# Patient Record
Sex: Female | Born: 1955 | Race: White | Hispanic: No | Smoking: Former smoker
Health system: Southern US, Community
[De-identification: ages and names within clinical notes are randomized; demographics above are authoritative.]

## PROBLEM LIST (undated history)

## (undated) DIAGNOSIS — J189 Pneumonia, unspecified organism: Secondary | ICD-10-CM

## (undated) DIAGNOSIS — K219 Gastro-esophageal reflux disease without esophagitis: Secondary | ICD-10-CM

## (undated) DIAGNOSIS — T7840XA Allergy, unspecified, initial encounter: Secondary | ICD-10-CM

## (undated) DIAGNOSIS — J439 Emphysema, unspecified: Secondary | ICD-10-CM

## (undated) DIAGNOSIS — G43909 Migraine, unspecified, not intractable, without status migrainosus: Secondary | ICD-10-CM

## (undated) DIAGNOSIS — I10 Essential (primary) hypertension: Secondary | ICD-10-CM

## (undated) DIAGNOSIS — J45909 Unspecified asthma, uncomplicated: Secondary | ICD-10-CM

## (undated) DIAGNOSIS — H269 Unspecified cataract: Secondary | ICD-10-CM

## (undated) DIAGNOSIS — F419 Anxiety disorder, unspecified: Secondary | ICD-10-CM

## (undated) DIAGNOSIS — M1990 Unspecified osteoarthritis, unspecified site: Secondary | ICD-10-CM

## (undated) DIAGNOSIS — K602 Anal fissure, unspecified: Secondary | ICD-10-CM

## (undated) DIAGNOSIS — J449 Chronic obstructive pulmonary disease, unspecified: Secondary | ICD-10-CM

## (undated) DIAGNOSIS — D126 Benign neoplasm of colon, unspecified: Secondary | ICD-10-CM

## (undated) DIAGNOSIS — K259 Gastric ulcer, unspecified as acute or chronic, without hemorrhage or perforation: Secondary | ICD-10-CM

## (undated) DIAGNOSIS — S92001A Unspecified fracture of right calcaneus, initial encounter for closed fracture: Secondary | ICD-10-CM

## (undated) HISTORY — DX: Unspecified cataract: H26.9

## (undated) HISTORY — DX: Essential (primary) hypertension: I10

## (undated) HISTORY — DX: Unspecified osteoarthritis, unspecified site: M19.90

## (undated) HISTORY — DX: Anal fissure, unspecified: K60.2

## (undated) HISTORY — PX: RIGHT OOPHORECTOMY: SHX2359

## (undated) HISTORY — DX: Gastric ulcer, unspecified as acute or chronic, without hemorrhage or perforation: K25.9

## (undated) HISTORY — DX: Chronic obstructive pulmonary disease, unspecified: J44.9

## (undated) HISTORY — DX: Anxiety disorder, unspecified: F41.9

## (undated) HISTORY — DX: Pneumonia, unspecified organism: J18.9

## (undated) HISTORY — DX: Gastro-esophageal reflux disease without esophagitis: K21.9

## (undated) HISTORY — DX: Emphysema, unspecified: J43.9

## (undated) HISTORY — DX: Benign neoplasm of colon, unspecified: D12.6

## (undated) HISTORY — DX: Unspecified asthma, uncomplicated: J45.909

## (undated) HISTORY — PX: TUBAL LIGATION: SHX77

## (undated) HISTORY — DX: Allergy, unspecified, initial encounter: T78.40XA

## (undated) HISTORY — PX: SHOULDER SURGERY: SHX246

## (undated) HISTORY — DX: Migraine, unspecified, not intractable, without status migrainosus: G43.909

## (undated) HISTORY — DX: Unspecified fracture of right calcaneus, initial encounter for closed fracture: S92.001A

## (undated) HISTORY — PX: OTHER SURGICAL HISTORY: SHX169

---

## 1999-03-06 ENCOUNTER — Encounter: Payer: Self-pay | Admitting: Emergency Medicine

## 1999-03-06 ENCOUNTER — Emergency Department (HOSPITAL_COMMUNITY): Admission: EM | Admit: 1999-03-06 | Discharge: 1999-03-06 | Payer: Self-pay | Admitting: Emergency Medicine

## 2000-02-07 ENCOUNTER — Emergency Department (HOSPITAL_COMMUNITY): Admission: EM | Admit: 2000-02-07 | Discharge: 2000-02-07 | Payer: Self-pay | Admitting: Emergency Medicine

## 2000-02-07 ENCOUNTER — Encounter: Payer: Self-pay | Admitting: Emergency Medicine

## 2000-02-27 ENCOUNTER — Encounter: Admission: RE | Admit: 2000-02-27 | Discharge: 2000-04-03 | Payer: Self-pay | Admitting: Orthopedic Surgery

## 2000-07-15 ENCOUNTER — Emergency Department (HOSPITAL_COMMUNITY): Admission: EM | Admit: 2000-07-15 | Discharge: 2000-07-15 | Payer: Self-pay

## 2004-05-18 ENCOUNTER — Ambulatory Visit (HOSPITAL_COMMUNITY): Admission: RE | Admit: 2004-05-18 | Discharge: 2004-05-18 | Payer: Self-pay | Admitting: Orthopedic Surgery

## 2006-11-09 ENCOUNTER — Ambulatory Visit: Payer: Self-pay | Admitting: Internal Medicine

## 2006-11-18 ENCOUNTER — Emergency Department (HOSPITAL_COMMUNITY): Admission: EM | Admit: 2006-11-18 | Discharge: 2006-11-18 | Payer: Self-pay | Admitting: Emergency Medicine

## 2006-11-19 ENCOUNTER — Emergency Department (HOSPITAL_COMMUNITY): Admission: EM | Admit: 2006-11-19 | Discharge: 2006-11-19 | Payer: Self-pay | Admitting: Emergency Medicine

## 2007-01-07 ENCOUNTER — Emergency Department (HOSPITAL_COMMUNITY): Admission: EM | Admit: 2007-01-07 | Discharge: 2007-01-07 | Payer: Self-pay | Admitting: Emergency Medicine

## 2010-02-09 ENCOUNTER — Encounter: Admission: RE | Admit: 2010-02-09 | Discharge: 2010-02-09 | Payer: Self-pay | Admitting: Family Medicine

## 2010-02-09 ENCOUNTER — Encounter: Payer: Self-pay | Admitting: Internal Medicine

## 2010-02-22 DIAGNOSIS — G5600 Carpal tunnel syndrome, unspecified upper limb: Secondary | ICD-10-CM | POA: Insufficient documentation

## 2010-02-22 DIAGNOSIS — J309 Allergic rhinitis, unspecified: Secondary | ICD-10-CM | POA: Insufficient documentation

## 2010-02-22 DIAGNOSIS — F419 Anxiety disorder, unspecified: Secondary | ICD-10-CM | POA: Insufficient documentation

## 2010-05-04 ENCOUNTER — Ambulatory Visit: Payer: Self-pay | Admitting: Internal Medicine

## 2010-05-04 DIAGNOSIS — J449 Chronic obstructive pulmonary disease, unspecified: Secondary | ICD-10-CM | POA: Insufficient documentation

## 2010-05-04 DIAGNOSIS — F17200 Nicotine dependence, unspecified, uncomplicated: Secondary | ICD-10-CM | POA: Insufficient documentation

## 2010-05-06 ENCOUNTER — Telehealth: Payer: Self-pay | Admitting: Internal Medicine

## 2010-12-20 NOTE — Progress Notes (Signed)
Summary: symbicort change to advair  Phone Note Outgoing Call   Call placed by: Vernie Murders,  May 06, 2010 2:32 PM Call placed to: Patient Summary of Call: Recieved a PA request for symbicort 160/4.5 from [facility].  Per MW- okay to switch pt to adviar 250/50 1 puff two times a day.  LMOM for pt TCB Initial call taken by: Vernie Murders,  May 06, 2010 2:34 PM  Follow-up for Phone Call        pt states she don't want anything if she can't have symbicort, she refuses the advair and will discuss with mw at her next visit--will forward to mw to make him aware Follow-up by: Philipp Deputy CMA,  May 09, 2010 4:10 PM     Appended Document: symbicort change to advair give her enough symbicort samples until her next ov and we'll address this then   Appended Document: symbicort change to advair [facility]  Appended Document: symbicort change to advair Samples were left up front for pt and she is aware to pick these up and be sure and keep followup appt for future rx.

## 2010-12-20 NOTE — Assessment & Plan Note (Signed)
Summary: Pulmonary/ new pt eval for copd   Visit Type:  Initial Consult Copy to:  Dr. Holley Bouche Primary Provider/Referring Provider:  Dr. Holley Bouche  CC:  Pulmonary consult. The patient c/o cough with yellow mucus that has recurred several times since March 2011. She says the cough is worse when lying down. SOB with exertion and at rest..  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  assoc with congested cough and nasal congestionsome better with mucinex.  Pt denies any significant sore throat, dysphagia, itching, sneezing, ,  fever, chills, sweats, unintended wt loss, pleuritic or exertional cp, hempoptysis, change in activity tolerance  orthopnea pnd or leg swelling.  Pt also denies any obvious fluctuation in symptoms with weather or environmental change or other alleviating or aggravating factors.       Preventive Screening-Counseling & Management  Alcohol-Tobacco     Alcohol drinks/day: <1     Smoking Status: current     Smoking Cessation Counseling: yes     Packs/Day: 0.5 x30 years  Current Medications (verified): 1)  Xanax 0.5 Mg Tabs (Alprazolam) .Marland Kitchen.. 1 By Mouth Four Times Daily As Needed  Allergies (verified): 1)  ! Biaxin 2)  ! Erythromycin 3)  ! Codeine 4)  ! Nsaids  Past History:  Past Medical History: COPD    - HFA  May 04, 2010 = 50% effective ALLERGIC RHINITIS (ICD-477.9) INSOMNIA (ICD-780.52) CARPAL TUNNEL SYNDROME, BILATERAL (ICD-354.0) ANXIETY (ICD-300.00)  Past Surgical History: Ganglion cyst removed left wrist in 1985 Left ovary removed in 1983  Family  History: Lymphoma---father Negative for respiratory diseases or atopy   Social History: Current smoker Disabled MarriedSmoking Status:  current Packs/Day:  0.5 x30 years Alcohol drinks/day:  <1  Review of Systems       The patient complains of shortness of breath with activity, shortness of breath at rest, productive cough, non-productive cough, tooth/dental problems, nasal congestion/difficulty breathing through nose, anxiety, and change in color of mucus.  The patient denies coughing up blood, chest pain, irregular heartbeats, acid heartburn, indigestion, loss of appetite, weight change, abdominal pain, difficulty swallowing, sore throat, headaches, sneezing, itching, ear ache, depression, hand/feet swelling, joint stiffness or pain, rash, and fever.    Vital Signs:  Patient profile:   55 year old female Height:      65 inches (165.10 cm) Weight:      124 pounds (56.36 kg) BMI:     20.71 O2 Sat:      91 % on Room air Temp:     99.3 degrees F (37.39 degrees C) oral Pulse rate:   95 / minute BP sitting:   128 / 80  (right arm) Cuff size:   regular  Vitals Entered By: Michel Bickers CMA (May 04, 2010 11:33 AM)  O2 Sat at Rest %:  91 O2 Flow:  Room air CC: Pulmonary consult. The patient c/o cough with yellow mucus that has recurred several times since March 2011. She says the cough is worse when lying down. SOB with exertion and at rest. Is Patient Diabetic? No Comments Medications reviewed. Daytime phone verified. Michel Bickers  CMA  May 04, 2010 11:33 AM   Physical Exam  Additional Exam:  wt 124 extremely anxious amb wf who  failed to answer a single question asked in a straightforward manner, tending to go off on tangents or answer questions with ambiguous medical terms or diagnoses and seemed upset when asked the same question more than once for clarification.  HEENT mild turbinate edema.  Oropharynx no thrush or excess pnd or cobblestoning.  No JVD or cervical adenopathy. Mild  accessory muscle hypertrophy. Trachea midline, nl thryroid. Chest was hyperinflated by percussion with diminished breath sounds and moderate increased exp time without wheeze. Hoover sign positive at mid inspiration. Regular rate and rhythm without murmur gallop or rub or increase P2 or edema.  Abd: no hsm, nl excursion. Ext warm without cyanosis or clubbing.     CXR  Procedure date:  05/04/2010  Findings:      c/w copd  Impression & Recommendations:  Problem # 1:  COPD UNSPECIFIED (ICD-496) DDX of  difficult airways managment all start with A and  include Adherence, Ace Inhibitors, Acid Reflux, Active Sinus Disease, Alpha 1 Antitripsin deficiency, Anxiety masquerading as Airways dz,  ABPA,  allergy(esp in young), Aspiration (esp in elderly), Adverse effects of DPI,  Active smokers, plus one B  = Beta blocker use..    In this case Adherence is the biggest issue and starts with  inability to use HFA effectively and also  understand that SABA treats the symptoms but doesn't get to the underlying problem (inflammation).  I used  the ananology of putting steroid cream on a rash to help explain the meaning of topical therapy and the need to get the drug to the target tissue.  rec trial of symbicort  I spent extra time with the patient today explaining optimal mdi  technique.  This improved from  25-50%  Active smoking :  see problem #2  Problem # 2:  SMOKER (ICD-305.1)  I took this opportunity to educate the patient regarding the consequences of smoking in airway disorders based on all the data we have from the multiple national lung health studies indicating that smoking cessation, not choice of inhalers or physicians, is the most important aspect of care.     I emphasized that although we never turn away smokers from the pulmonary clinic, we do ask that they understand that the recommendations that were made won't work nearly as well in the presence of continued cigarette exposure and we may  reach a point where we can't help the patient if he/she can't quit smoking.   Since already on psychotropics prefer she get all cns active meds from the same doctor and see Dr Tiburcio Pea 7 days before quit date for consideration of chantix.  Orders: New Patient Level V (95284)  Medications Added to Medication List This Visit: 1)  Xanax 0.5 Mg Tabs (Alprazolam) .Marland Kitchen.. 1 by mouth four times daily as needed 2)  Symbicort 160-4.5 Mcg/act Aero (Budesonide-formoterol fumarate) .... 2 puffs first thing  in am and 2 puffs again in pm about 12 hours later 3)  Ventolin Hfa 108 (90 Base) Mcg/act Aers (Albuterol sulfate) .Marland Kitchen.. 1-2 puffs every 4-6 hours as needed  Patient Instructions: 1)  committ to quit smoking 2)  Symbicort 160 2 puffs first thing  in am and 2 puffs again in pm about 12 hours later 3)  and use ventolin for breathing, mucinex for cough 4)  Please schedule a follow-up appointment in 6 weeks (or first available) ,  sooner if needed for PFT's  5)  Work on inhaler technique:  relax and blow all the way out then take a nice smooth deep breath back in, triggering the inhaler at same time you start breathing in and hold at least a few seconds Prescriptions: SYMBICORT 160-4.5 MCG/ACT  AERO (BUDESONIDE-FORMOTEROL FUMARATE) 2 puffs first thing  in am and 2 puffs again in pm about 12 hours later  #1 x 11   Entered and Authorized by:   Nyoka Cowden MD   Signed by:   Nyoka Cowden MD on 05/04/2010   Method used:   Electronically to        [facility] [phone]* (retail)       [address]       [place], [place]  [address]       Ph: [phone]       Fax: [phone]   RxID:   [id number]    CXR  Procedure date:  05/04/2010  Findings:      c/w copd

## 2011-04-04 NOTE — H&P (Signed)
Casey Schmidt, Casey Schmidt                ACCOUNT NO.:  [id number]   MEDICAL RECORD NO.:  [id number]          PATIENT TYPE:  AMB   LOCATION:                                FACILITY:  [facility]   PHYSICIAN:  Charles A. Delcambre, MD    DATE OF BIRTH:   DATE OF ADMISSION:  02/26/2007  DATE OF DISCHARGE:                              HISTORY & PHYSICAL   This is a 55 year old para 2-0-0-2, vaginal deliveries, tubal ligation  done for contraception.  Periods every 3-4 weeks but now bleeding almost  constantly despite medical treatment with Depo-Provera as well as  antibiotics, doxycycline.  She had endometrial biopsy which was benign  on December 27, 2006.  Ultrasound was done December 25, 2006, which showed  a normal-sized uterus 9.5 x 5.0 x 5.7 cm.  Right ovary surgically  absent, left ovary normal size.  She continues now to have bleeding  despite conservative efforts and has accompanying dysmenorrhea.  She has  been given some Vicodin to use sparingly.   PAST MEDICAL HISTORY:  1. Carpal tunnel syndrome.  2. Anxiety.   PAST SURGICAL HISTORY:  Cyst on the ovary 24 years ago, oophorectomy  done on the right with the tube also.   MEDICATIONS:  Xanax p.r.n. not taken regularly, Tylenol, and oxycodone  for shoulder and foot pain.   ALLERGIES:  ASPIRIN, CODEINE, DARVOCET, IBUPROFEN.  REACTIONS NOT  SPECIFIED.   SOCIAL HISTORY:  Married 25 years.  No condom use.  She smokes 1/4 of a  pack per day, and this has been for the last 30 years.  She drinks  socially per the patient history.  Denies current abuse.   FAMILY HISTORY:  No breast, colon, cervical, ovarian cancer.   She states last Pap smear was about 3 years ago, was negative.  Denies  any abnormal Pap smears by history.   PHYSICAL EXAMINATION:  VITAL SIGNS:  Alert and oriented x3, no distress.  Blood pressure 130/90, respirations 24, heart rate 88, afebrile.  HEENT:  Grossly within normal limits.  NECK:  Supple without thyromegaly,  adenopathy.  LUNGS:  Clear bilaterally.  BREAST EXAM:  Symmetrical, deferred.  ABDOMEN:  Soft, flat, nontender.  Some left lower quadrant mild  tenderness that she states is worse when she is bleeding.  I can detect  no masses, no hepatosplenomegaly noted as well.  PELVIC:  Normal external female genital, Bartholin, urethral, Skene's  within normal limits.  Vault without discharge.  A small amount of blood  is in the vault.  Multiparous cervix is noted.  Valsalva will push the  cervix down to within 2 cm of the edge of the introitus.  Tenaculum  could tug it to within 1 cm of the introitus.  BIMANUAL EXAMINATION:  No cervical motion tenderness is present.  Uterus  was nontender, anteverted.  Left adnexa, ovary palpable and normal size.  Minimally tender.  RECTAL EXAM:  Not done.  Anus, perineal body appear normal.   ASSESSMENT:  1. Menorrhagia 66.2.  2. Pelvic pain 625.9.  3. Dysmenorrhea 625.3.   PLAN:  Offered ablation, laparoscopy  for treatment of bleeding and  evaluation of pain.  She declines and wishes to proceed on with  definitive therapy with transvaginal hysterectomy and left salpingo-  oophorectomy.  She is counseled, gives informed consent and accepts the  risks of infection, bleeding, bowel and bladder damage, ureteral damage;  blood product risk including hepatitis and HIV exposure.  Remote risk of  vaginal vault prolapse after surgery.  All questions are answered, and  we will proceed as scheduled on February 26, 2007.   ADDENDUM:  We will have her undergo a Pap smear prior to surgery.      Charles A. Sydnee Cabal, MD  Electronically Signed     CAD/MEDQ  D:  02/05/2007  T:  02/05/2007  Job:  [id number]

## 2013-11-20 HISTORY — PX: WRIST SURGERY: SHX841

## 2014-02-25 ENCOUNTER — Emergency Department (HOSPITAL_COMMUNITY)
Admission: EM | Admit: 2014-02-25 | Discharge: 2014-02-25 | Disposition: A | Discharge status: Home / Self Care | Payer: Medicare Other | Attending: Emergency Medicine | Admitting: Emergency Medicine

## 2014-02-25 ENCOUNTER — Encounter (HOSPITAL_COMMUNITY): Payer: Self-pay | Admitting: Emergency Medicine

## 2014-02-25 ENCOUNTER — Emergency Department (HOSPITAL_COMMUNITY): Payer: Medicare Other

## 2014-02-25 DIAGNOSIS — Y939 Activity, unspecified: Secondary | ICD-10-CM | POA: Insufficient documentation

## 2014-02-25 DIAGNOSIS — Y929 Unspecified place or not applicable: Secondary | ICD-10-CM | POA: Insufficient documentation

## 2014-02-25 DIAGNOSIS — S52601A Unspecified fracture of lower end of right ulna, initial encounter for closed fracture: Secondary | ICD-10-CM

## 2014-02-25 DIAGNOSIS — S52509A Unspecified fracture of the lower end of unspecified radius, initial encounter for closed fracture: Secondary | ICD-10-CM | POA: Insufficient documentation

## 2014-02-25 DIAGNOSIS — F17200 Nicotine dependence, unspecified, uncomplicated: Secondary | ICD-10-CM | POA: Insufficient documentation

## 2014-02-25 DIAGNOSIS — W010XXA Fall on same level from slipping, tripping and stumbling without subsequent striking against object, initial encounter: Secondary | ICD-10-CM | POA: Insufficient documentation

## 2014-02-25 DIAGNOSIS — S52609A Unspecified fracture of lower end of unspecified ulna, initial encounter for closed fracture: Principal | ICD-10-CM

## 2014-02-25 DIAGNOSIS — S52501A Unspecified fracture of the lower end of right radius, initial encounter for closed fracture: Secondary | ICD-10-CM

## 2014-02-25 MED ORDER — ONDANSETRON 4 MG PO TBDP
4.0000 mg | ORAL_TABLET | Freq: Once | ORAL | Status: AC
  Administered 2014-02-25: 4 mg via ORAL
  Filled 2014-02-25: qty 1

## 2014-02-25 MED ORDER — ONDANSETRON HCL 4 MG PO TABS
4.0000 mg | ORAL_TABLET | Freq: Four times a day (QID) | ORAL | Status: DC
Start: 2014-02-25 — End: 2014-06-24

## 2014-02-25 MED ORDER — OXYCODONE-ACETAMINOPHEN 5-325 MG PO TABS
1.0000 | ORAL_TABLET | Freq: Once | ORAL | Status: AC
  Administered 2014-02-25: 1 via ORAL
  Filled 2014-02-25: qty 1

## 2014-02-25 MED ORDER — OXYCODONE-ACETAMINOPHEN 5-325 MG PO TABS: 2.0000 | ORAL_TABLET | ORAL | Status: DC | PRN

## 2014-02-25 NOTE — ED Notes (Signed)
Pt states tripped last night and hurt right wrist; presents with bruising and swelling to right wrist/forearm

## 2014-02-25 NOTE — Discharge Instructions (Signed)
Please follow up with Dr. Burney Gauze tomorrow at 8:10am in his office for further care.  Keep your wrist elevated and take pain medications as needed.    Wrist Fracture A wrist fracture is a break in one of the bones of the wrist. Your wrist is made up of several small bones at the palm of your hand (carpal bones) and the two bones that make up your forearm (radius and ulna). The bones come together to form multiple large and small joints. The shape and design of these joints allow your wrist to bend and straighten, move side-to-side, and rotate, as in twisting your palm up or down. CAUSES  A fracture may occur in any of the bones in your wrist when enough force is applied to the wrist, such as when falling down onto an outstretched hand. Severe injuries may occur from a more forceful injury. SYMPTOMS Symptoms of wrist fractures include tenderness, bruising, and swelling. Additionally, the wrist may hang in an odd position or may be misshaped. DIAGNOSIS To diagnose a wrist fracture, your caregiver will physically examine your wrist. Your caregiver may also request an X-ray exam of your wrist. TREATMENT Treatment depends on many factors, including the nature and location of the fracture, your age, and your activity level. Treatment for wrist fracture can be nonsurgical or surgical. For nonsurgical treatment, a plaster cast or splint may be applied to your wrist if the bone is in a good position (aligned). The cast will stay on for about 6 weeks. If the alignment of your bone is not good, it may be necessary to realign (reduce) it. After the bone is reduced, a splint usually is placed on your wrist to allow for a small amount of normal swelling. After about 1 week, the splint is removed and a cast is added. The cast is removed 2 or 3 weeks later, after the swelling goes down, causing the cast to loosen. Another cast is applied. This cast is removed after about another 2 or 3 weeks, for a total of 4 to 6  weeks of immobilization. Sometimes the position of the bone is so far out of place that surgery is required to apply a device to hold it together as it heals. If the bone cannot be reduced without cutting the skin around the bone (closed reduction), a cut (incision) is made to allow direct access to the bone to reduce it (open reduction). Depending on the fracture, there are a number of options for holding the bone in place while it heals, including a cast, metal pins, a plate and screws, and a device called an external fixator. With an external fixator, most of the hardware remains outside of the body. HOME CARE INSTRUCTIONS  To lessen swelling, keep your injured wrist elevated and move your fingers as much as possible.  Apply ice to your wrist for the first 1 to 2 days after you have been treated or as directed by your caregiver. Applying ice helps to reduce inflammation and pain.  Put ice in a plastic bag.  Place a towel between your skin and the bag.  Leave the ice on for 15 to 20 minutes at a time, every 2 hours while you are awake.  Do not put pressure on any part of your cast or splint. It may break.  Use a plastic bag to protect your cast or splint from water while bathing or showering. Do not lower your cast or splint into water.  Only take over-the-counter or prescription  medicines for pain as directed by your caregiver. SEEK IMMEDIATE MEDICAL CARE IF:   Your cast or splint gets damaged or breaks.  You have continued severe pain or more swelling than you did before the cast was put on.  Your skin or fingernails below the injury turn blue or gray or feel cold or numb.  You develop decreased feeling in your fingers. MAKE SURE YOU:  Understand these instructions.  Will watch your condition.  Will get help right away if you are not doing well or get worse. Document Released: 08/16/2005 Document Revised: 01/29/2012 Document Reviewed: 11/24/2011 [facility] Patient Information  2014 [place], [place].

## 2014-02-25 NOTE — ED Provider Notes (Signed)
CSN: [id number]     Arrival date & time 02/25/14  1131 History  This chart was scribed for non-physician practitioner Domenic Moras, PA-C working with Ephraim Hamburger, MD by Rolanda Lundborg, ED Scribe. This patient was seen in room WTR8/WTR8 and the patient's care was started at 1:00 PM.     Chief Complaint  Patient presents with  . Wrist Injury   The history is provided by the patient. No language interpreter was used.   HPI Comments: Casey Schmidt is a 58 y.o. female who presents to the Emergency Department complaining of sudden-onset, constant right wrist pain with associated bruising and swelling since tripping over a laundry bag last night. She states she fell forward and put her hand out to break the fall. She denies hitting her head or LOC. She denies pain in the elbow or shoulder.    History reviewed. No pertinent past medical history. History reviewed. No pertinent past surgical history. No family history on file. History  Substance Use Topics  . Smoking status: Current Every Day Smoker    Types: Cigarettes  . Smokeless tobacco: Not on file  . Alcohol Use: Not on file   OB History   Grav Para Term Preterm Abortions TAB SAB Ect Mult Living                 Review of Systems  Musculoskeletal: Positive for arthralgias (right wrist) and joint swelling (right wrist).      Allergies  Aspirin; Clarithromycin; Codeine; Erythromycin; and Nsaids  Home Medications  No current outpatient prescriptions on file. BP 144/86  Pulse 103  Temp(Src) 97.6 F (36.4 C) (Oral)  Resp 14  SpO2 100% Physical Exam  Nursing note and vitals reviewed. Constitutional: She is oriented to person, place, and time. She appears well-developed and well-nourished. No distress.  HENT:  Head: Normocephalic and atraumatic.  Eyes: EOM are normal.  Neck: Neck supple. No tracheal deviation present.  Cardiovascular: Normal rate.   Pulmonary/Chest: Effort normal. No respiratory distress.   Musculoskeletal:  Right arm - right wrist with moderate swelling and echhymosis and tenderness throughout wrist but most significant to the ulnar aspect with crepitus . Radial pulse is 2+. Decreased grip strength secondary to pain but normal senstation to all fingers. Right elbow and right shoulder normal.   Neurological: She is alert and oriented to person, place, and time.  Skin: Skin is warm and dry.  Psychiatric: She has a normal mood and affect. Her behavior is normal.    ED Course  Procedures (including critical care time) Medications  oxyCODONE-acetaminophen (PERCOCET/ROXICET) 5-325 MG per tablet 1 tablet (1 tablet Oral Given 02/25/14 1350)  ondansetron (ZOFRAN-ODT) disintegrating tablet 4 mg (4 mg Oral Given 02/25/14 1350)    DIAGNOSTIC STUDIES: Oxygen Saturation is 100% on RA, normal by my interpretation.    COORDINATION OF CARE: 1:26 PM- patient with a right wrist fracture, closed, involving both the distal ulna and radius. She is neurovascularly intact. I have consult with hand specialist who recommended splinting patient and have close follow up tomorrow. Discussed treatment plan with pt which includes splint application, pain medication, and referral to a hand specialist. Pt agrees to plan. Care discussed with Dr. Regenia Skeeter  2:44 PM - Will have an appointment with Dr Burney Gauze tomorrow at 8:10am.    Labs Review Labs Reviewed - No data to display Imaging Review Dg Wrist Complete Right  02/25/2014   CLINICAL DATA:  Pain status post fall  EXAM: RIGHT WRIST - COMPLETE  3+ VIEW  COMPARISON:  None.  FINDINGS: The bones are osteopenic. An impacted transverse fracture with dorsal angulation and displacement is appreciated along the distal radius. A nondisplaced fracture is appreciated along the base of the ulnar styloid with a component of impaction.  IMPRESSION: Distal ulnar and radius fractures.   Electronically Signed   By: Margaree Mackintosh M.D.   On: 02/25/2014 12:38     EKG  Interpretation None      MDM   Final diagnoses:  Fracture of radius, distal, with ulna, right, closed    BP 144/86  Pulse 103  Temp(Src) 97.6 F (36.4 C) (Oral)  Resp 14  SpO2 100%  I have reviewed nursing notes and vital signs. I personally reviewed the imaging tests through PACS system  I reviewed available ER/hospitalization records thought the EMR   I personally performed the services described in this documentation, which was scribed in my presence. The recorded information has been reviewed and is accurate.    Domenic Moras, PA-C 02/25/14 1526

## 2014-02-26 NOTE — ED Provider Notes (Signed)
Medical screening examination/treatment/procedure(s) were performed by non-physician practitioner and as supervising physician I was immediately available for consultation/collaboration.   EKG Interpretation None        Ephraim Hamburger, MD 02/26/14 [phone]

## 2014-04-30 ENCOUNTER — Telehealth: Payer: Self-pay | Admitting: *Deleted

## 2014-04-30 NOTE — Telephone Encounter (Addendum)
Attempted to call pt, unable to leave a message. Voice mail not set up.    Medication List and Allergies:  90 Day supply/Mail order: Local pharmacy: Immunizations Due:  A/P FH/PSH or Personal History: Flu Vaccine: Tdap: PNA: Shingles: CCS: Pap: MMG:  BD:  To discuss with provider:

## 2014-05-01 ENCOUNTER — Ambulatory Visit: Payer: Medicare Other | Admitting: Internal Medicine

## 2014-05-01 DIAGNOSIS — Z0289 Encounter for other administrative examinations: Secondary | ICD-10-CM

## 2014-06-24 ENCOUNTER — Encounter: Payer: Self-pay | Admitting: Internal Medicine

## 2014-06-24 ENCOUNTER — Ambulatory Visit (INDEPENDENT_AMBULATORY_CARE_PROVIDER_SITE_OTHER): Payer: Medicare Other | Admitting: Internal Medicine

## 2014-06-24 VITALS — BP 120/74 | HR 109 | Temp 98.2°F | Ht 64.0 in | Wt 103.0 lb

## 2014-06-24 DIAGNOSIS — J4489 Other specified chronic obstructive pulmonary disease: Secondary | ICD-10-CM

## 2014-06-24 DIAGNOSIS — M25531 Pain in right wrist: Secondary | ICD-10-CM

## 2014-06-24 DIAGNOSIS — M1990 Unspecified osteoarthritis, unspecified site: Secondary | ICD-10-CM | POA: Insufficient documentation

## 2014-06-24 DIAGNOSIS — F411 Generalized anxiety disorder: Secondary | ICD-10-CM

## 2014-06-24 DIAGNOSIS — M25539 Pain in unspecified wrist: Secondary | ICD-10-CM

## 2014-06-24 DIAGNOSIS — J449 Chronic obstructive pulmonary disease, unspecified: Secondary | ICD-10-CM

## 2014-06-24 MED ORDER — HYDROCODONE-ACETAMINOPHEN 5-325 MG PO TABS: 1.0000 | ORAL_TABLET | Freq: Three times a day (TID) | ORAL | Status: DC | PRN

## 2014-06-24 NOTE — Progress Notes (Signed)
Subjective:    Patient ID: Casey Schmidt, female    DOB: [birth year], 58 y.o.   MRN: [id number]  DOS:  06/24/2014 Type of visit - description: New patient, to get established History:  Previous PCP Dr. Kenton Kingfisher at [facility] In general doing well, taking Xanax for anxiety, lost her husband. The last time she took Xanax was today. Also had a wrist fracture in April, was prescribed Percocet by the ER doctor and by the orthopedic surgery. She was released from orthopedic surgery care recently (per pt). Would like to have a prescription for pain medication. Last dose of Percocet about a week ago.    ROS Denies nausea, vomiting, diarrhea. No blood in the stool or stomach pain. No depression at this time, again mood is well controlled with as needed Xanax, she takes it most days.   Past Medical History  Diagnosis Date  . Arthritis   . COPD (chronic obstructive pulmonary disease)   . GERD (gastroesophageal reflux disease)   . Multiple gastric ulcers     d/t BC powders  . Hypertension   . Migraines     h/o  . Anxiety     Past Surgical History  Procedure Laterality Date  . Right oophorectomy    . Wrist surgery Right 2015    fx  . Tubal ligation    . Ganglion cyst removal Left remotely     from wrist    History   Social History  . Marital Status: Widowed    Spouse Name: N/A    Number of Children: 2  . Years of Education: N/A   Occupational History  . retired-disability d/t anxiety-djd    Social History Main Topics  . Smoking status: Former Smoker    Types: Cigarettes    Quit date: 05/24/2014  . Smokeless tobacco: Never Used  . Alcohol Use: Yes     Comment: rare  . Drug Use: No  . Sexual Activity: Not on file   Other Topics Concern  . Not on file   Social History Narrative   Lost husband 2014   Family History  Problem Relation Age of Onset  . Hypertension Mother   . Hypertension Father   . Cancer Father     type?         Medication List       This list is accurate as of: 06/24/14 11:59 PM.  Always use your most recent med list.               ALPRAZolam 0.5 MG tablet  Commonly known as:  XANAX  1 tablet as needed. Up to 4x daily     HYDROcodone-acetaminophen 5-325 MG per tablet  Commonly known as:  NORCO/VICODIN  Take 1 tablet by mouth every 8 (eight) hours as needed.     pantoprazole 40 MG tablet  Commonly known as:  PROTONIX  1 tablet daily.           Objective:   Physical Exam BP 120/74  Pulse 109  Temp(Src) 98.2 F (36.8 C) (Oral)  Ht 5\' 4"  (1.626 m)  Wt 103 lb (46.72 kg)  BMI 17.67 kg/m2  SpO2 99% General -- alert, well-developed, NAD.  Neck --no thyromegaly  HEENT-- Not pale.  Lungs -- normal respiratory effort, no intercostal retractions, no accessory muscle use, and decreased  breath sounds.  Heart-- normal rate, regular rhythm, no murmur.  Extremities-- no pretibial edema bilaterally  Neurologic--  alert & oriented X3. Speech normal, gait  appropriate for age, strength symmetric and appropriate for age.  Psych-- Cognition and judgment appear intact. Cooperative with normal attention span and concentration. No anxious or depressed appearing.      Assessment & Plan:   Get records from previous PCP

## 2014-06-24 NOTE — Patient Instructions (Signed)
Please stop by the lab and give [place] a urine sample for a UDS  For pain: Tylenol  500 mg OTC 1 tabs a day every 8 hours as needed for pain If the pain is severe, take hydrocodone instead  Next visit in 3 months, please make an appointment  Please sign a release of information to get the records from your previous PCP: Notes , XRs and labs for the last 2 years   EKG, echocardiogram, colonoscopy and  procedure notes

## 2014-06-24 NOTE — Assessment & Plan Note (Signed)
Status post surgery for a right wrist fracture in April 2015, Was taking Percocet, pain is okay but would like to have some pain medication in case she needs it. She is allergic to codeine but she reports tolerates Vicodin well. Plan: Tylenol as needed, low dose --- reports a higher dose cause nausea. Vicodin if pain  is a little more severe.

## 2014-06-24 NOTE — Assessment & Plan Note (Signed)
Uses a inhaler rarely, name?

## 2014-06-24 NOTE — Assessment & Plan Note (Signed)
Lost her husband last year, at some point in the past was on Wellbutrin, currently on Xanax only. Plan: Refill Xanax as needed UDS

## 2014-07-10 ENCOUNTER — Telehealth: Payer: Self-pay

## 2014-07-10 NOTE — Telephone Encounter (Signed)
UDS 06/24/2014  Positive for Xanax. Negative for hydromorphone and hydrocodone.  Low risk per Dr. Larose Kells (07/09/2014).

## 2014-07-22 ENCOUNTER — Ambulatory Visit (INDEPENDENT_AMBULATORY_CARE_PROVIDER_SITE_OTHER): Payer: Medicare Other | Admitting: Internal Medicine

## 2014-07-22 ENCOUNTER — Encounter: Payer: Self-pay | Admitting: Internal Medicine

## 2014-07-22 VITALS — BP 122/74 | HR 105 | Temp 98.3°F | Wt 104.1 lb

## 2014-07-22 DIAGNOSIS — F411 Generalized anxiety disorder: Secondary | ICD-10-CM

## 2014-07-22 DIAGNOSIS — R0609 Other forms of dyspnea: Secondary | ICD-10-CM

## 2014-07-22 DIAGNOSIS — R0989 Other specified symptoms and signs involving the circulatory and respiratory systems: Secondary | ICD-10-CM

## 2014-07-22 DIAGNOSIS — R0683 Snoring: Secondary | ICD-10-CM | POA: Insufficient documentation

## 2014-07-22 NOTE — Progress Notes (Signed)
   Subjective:    Patient ID: Casey Schmidt, female    DOB: September 22, 1956, 58 y.o.   MRN: [id number]  DOS:  07/22/2014 Type of visit - description : acute Interval history: Patient is concerned about his snoring, she is taking a trip and will be sharing a room with somebody else and is embarrassed. Reports that she knows she snores for at least 3 years, her family have not noted any apnea episodes. She already tried breathing strips but that didn't work.    ROS Denies depression, continue with anxiety. Denies feeling sleepy but she does feel tired  Past Medical History  Diagnosis Date  . Arthritis   . COPD (chronic obstructive pulmonary disease)   . GERD (gastroesophageal reflux disease)   . Multiple gastric ulcers     d/t BC powders  . Hypertension   . Migraines     h/o  . Anxiety     Past Surgical History  Procedure Laterality Date  . Right oophorectomy    . Wrist surgery Right 2015    fx  . Tubal ligation    . Ganglion cyst removal Left remotely     from wrist    History   Social History  . Marital Status: Widowed    Spouse Name: N/A    Number of Children: 2  . Years of Education: N/A   Occupational History  . retired-disability d/t anxiety-djd    Social History Main Topics  . Smoking status: Former Smoker    Types: Cigarettes    Quit date: 05/24/2014  . Smokeless tobacco: Never Used  . Alcohol Use: Yes     Comment: rare  . Drug Use: No  . Sexual Activity: Not on file   Other Topics Concern  . Not on file   Social History Narrative   Lost husband 2014        Medication List       This list is accurate as of: 07/22/14 11:59 PM.  Always use your most recent med list.               ALPRAZolam 0.5 MG tablet  Commonly known as:  XANAX  1 tablet as needed. Up to 4x daily     HYDROcodone-acetaminophen 5-325 MG per tablet  Commonly known as:  NORCO/VICODIN  Take 1 tablet by mouth every 8 (eight) hours as needed.     pantoprazole 40 MG  tablet  Commonly known as:  PROTONIX  1 tablet daily.           Objective:   Physical Exam BP 122/74  Pulse 105  Temp(Src) 98.3 F (36.8 C) (Oral)  Wt 104 lb 0.9 oz (47.2 kg)  SpO2 98% General -- alert, well-developed, NAD.  HEENT-- Not pale.  Face symmetric, sinuses not tender to palpation. Nose not congested. throat w/o redness, symmetric  Lung-- decreased BS B CV--  slt tachycardic   Extremities-- no pretibial edema bilaterally  Neurologic--  alert & oriented X3. Speech normal, gait appropriate for age, strength symmetric and appropriate for age.  Psych-- Cognition and judgment appear intact. Cooperative with normal attention span and concentration. slt  anxious but no  depressed appearing.        Assessment & Plan:   Slightly tachycardic, states he is anxious because she couldn't find our new office

## 2014-07-22 NOTE — Progress Notes (Signed)
Pre visit review using our clinic review tool, if applicable. No additional management support is needed unless otherwise documented below in the visit note. 

## 2014-07-22 NOTE — Assessment & Plan Note (Signed)
UDS low risk 06-2014

## 2014-07-22 NOTE — Assessment & Plan Note (Addendum)
3 years history of snoring, she feels tired but not sleepy however she filled a Epworth Sleepiness Scale and she scored 12 which is high. I suspect sleep apnea. For short-term treatment of snoring, rec a dental appliance , may like to start with a OTC. Will refer to pulmonary for further evaluation.  Cardiovascular consequences of untreated  sleep apnea ( if confirmed)  discussed with the patient

## 2014-07-22 NOTE — Patient Instructions (Addendum)
Tried to use any over-the-counter dental appliance for snoring. We are referring you to one of our sleep specialists See you in  November

## 2014-07-23 ENCOUNTER — Encounter: Payer: Self-pay | Admitting: Internal Medicine

## 2014-07-28 ENCOUNTER — Telehealth: Payer: Self-pay | Admitting: Internal Medicine

## 2014-07-28 MED ORDER — HYDROCODONE-ACETAMINOPHEN 5-325 MG PO TABS: 1.0000 | ORAL_TABLET | Freq: Three times a day (TID) | ORAL | Status: DC | PRN

## 2014-07-28 NOTE — Telephone Encounter (Signed)
Pt is requesting refill for Hydrocodone.   Last OV: 07/22/2014 Last Fill: 06/24/2014 # 30 with 0 RF Last UDS: 06/24/2014  Please advise.

## 2014-07-28 NOTE — Telephone Encounter (Signed)
Caller name: Kennyth Lose  Call back number:[phone]   Reason for call:  Pt wants a  Refill on Rx HYDROcodone-acetaminophen (NORCO/VICODIN) 5-325 MG per   Fyi: apt with pulmonology is 10/14

## 2014-07-28 NOTE — Telephone Encounter (Signed)
Spoke with Pt, informed her that medication is ready at front desk for pick up.

## 2014-07-28 NOTE — Telephone Encounter (Signed)
done

## 2014-07-30 ENCOUNTER — Telehealth: Payer: Self-pay | Admitting: Internal Medicine

## 2014-07-30 NOTE — Telephone Encounter (Signed)
Pt is requesting Alprazolam refill.  Last OV:07/22/2014 Last Fill: 03/17/2014 Last UDS: 06/24/2014  Please advise.

## 2014-07-30 NOTE — Telephone Encounter (Signed)
Request refill on xanax 0.5mg 

## 2014-07-31 MED ORDER — ALPRAZOLAM 0.5 MG PO TABS: 0.5000 mg | ORAL_TABLET | Freq: Four times a day (QID) | ORAL | Status: DC | PRN

## 2014-07-31 NOTE — Telephone Encounter (Signed)
done

## 2014-07-31 NOTE — Telephone Encounter (Signed)
Medication faxed to Pharmacy.  

## 2014-08-24 ENCOUNTER — Telehealth: Payer: Self-pay | Admitting: Internal Medicine

## 2014-08-24 MED ORDER — HYDROCODONE-ACETAMINOPHEN 5-325 MG PO TABS: 1.0000 | ORAL_TABLET | Freq: Three times a day (TID) | ORAL | Status: DC | PRN

## 2014-08-24 NOTE — Telephone Encounter (Signed)
Caller name: Casey Schmidt  Call back [phone]   Reason for call:  Pt would like a refill on Rx HYDROcodone-acetaminophen (NORCO/VICODIN) 5-325 MG.  Call when ready.

## 2014-08-24 NOTE — Telephone Encounter (Signed)
Ok  #30 

## 2014-08-24 NOTE — Telephone Encounter (Signed)
Pt is requesting refill for Hydrocodone.  Last OV: 07/22/2014  Last Fill: 07/28/2014 # 30 0RF UDS: 06/24/2014 Low risk  Please advise.

## 2014-08-25 NOTE — Telephone Encounter (Signed)
LMOM for Pt informing her rx is ready for pick up at front desk.

## 2014-09-02 ENCOUNTER — Encounter (INDEPENDENT_AMBULATORY_CARE_PROVIDER_SITE_OTHER): Payer: Self-pay

## 2014-09-02 ENCOUNTER — Ambulatory Visit (INDEPENDENT_AMBULATORY_CARE_PROVIDER_SITE_OTHER): Payer: Medicare Other | Admitting: Pulmonary Disease

## 2014-09-02 ENCOUNTER — Encounter: Payer: Self-pay | Admitting: Pulmonary Disease

## 2014-09-02 VITALS — BP 124/80 | HR 108 | Temp 97.8°F | Ht 64.0 in | Wt 103.0 lb

## 2014-09-02 DIAGNOSIS — R0683 Snoring: Secondary | ICD-10-CM

## 2014-09-02 DIAGNOSIS — Z23 Encounter for immunization: Secondary | ICD-10-CM

## 2014-09-02 NOTE — Progress Notes (Signed)
Subjective:    Patient ID: Casey Schmidt, female    DOB: 1956-02-15, 58 y.o.   MRN: [id number]  HPI The patient is a 58 year old female who I've been asked to see for possible obstructive sleep apnea. The patient has been noted to have loud snoring, as well as an abnormal breathing pattern during sleep. She has frequent awakenings at night, and is not rested in the mornings upon arising. She notes significant sleep pressure during the day, and can fall asleep easily with inactivity. She can also doze in the evenings watching TV if the program is not interesting, but denies any sleepiness with driving. It should be noted the patient only weighs 103 pounds, but has been snoring for quite some time. She has gained 15 pounds over the last 2 years on purpose, and her Epworth score is very abnormal at 16. It should be noted that she does kick her legs at times, but does not have symptoms of RLS. She has known chronic musculoskeletal pain.   Sleep Questionnaire What time do you typically go to bed?( Between what hours) 11 PM 11 PM at 1618 on 09/02/14 by Inge Rise, CMA How long does it take you to fall asleep? 1 hr 1 hr at 1618 on 09/02/14 by Inge Rise, CMA How many times during the night do you wake up? 4 4 at 1618 on 09/02/14 by Inge Rise, [place] What time do you get out of bed to start your day? 0800 0800 at 1618 on 09/02/14 by Inge Rise, CMA Do you drive or operate heavy machinery in your occupation? No No at 1618 on 09/02/14 by Inge Rise, CMA How much has your weight changed (up or down) over the past two years? (In pounds) 15 lb (6.804 kg) 15 lb (6.804 kg) at 1618 on 09/02/14 by Inge Rise, CMA Have you ever had a sleep study before? No No at 1618 on 09/02/14 by Inge Rise, CMA Do you currently use CPAP? No No at 1618 on 09/02/14 by Inge Rise, CMA Do you wear oxygen at any time? No No at 1618 on 09/02/14 by Inge Rise, CMA   Review of Systems    Constitutional: Negative for fever and unexpected weight change.  HENT: Positive for sneezing. Negative for congestion, dental problem, ear pain, nosebleeds, postnasal drip, rhinorrhea, sinus pressure, sore throat and trouble swallowing.   Eyes: Negative for redness and itching.  Respiratory: Positive for cough and shortness of breath. Negative for chest tightness and wheezing.   Cardiovascular: Negative for palpitations and leg swelling.  Gastrointestinal: Negative for nausea and vomiting.  Genitourinary: Negative for dysuria.  Musculoskeletal: Negative for joint swelling.  Skin: Negative for rash.  Neurological: Negative for headaches.  Hematological: Does not bruise/bleed easily.  Psychiatric/Behavioral: Negative for dysphoric mood. The patient is nervous/anxious.        Objective:   Physical Exam Constitutional:  Thin female, no acute distress  HENT:  Nares patent without discharge  Oropharynx without exudate, palate and uvula are significantly elongated.   Eyes:  Perrla, eomi, no scleral icterus  Neck:  No JVD, no TMG  Cardiovascular:  Normal rate, regular rhythm, no rubs or gallops.  2/6 sem        Intact distal pulses  Pulmonary :  Mildly decreased breath sounds, no stridor or respiratory distress   No rales, rhonchi, or wheezing  Abdominal:  Soft, nondistended, bowel sounds present.  No tenderness noted.  Musculoskeletal:  No lower extremity edema noted.  Lymph Nodes:  No cervical lymphadenopathy noted  Skin:  No cyanosis noted  Neurologic:  Alert, appropriate, moves all 4 extremities without obvious deficit.         Assessment & Plan:

## 2014-09-02 NOTE — Assessment & Plan Note (Signed)
The patient has loud snoring as well as an abnormal breathing pattern during sleep noted by her family. She has frequent awakenings, and nonrestorative sleep. She also describes significant sleep pressure during the day. However, she does not have a body habitus for someone with clinically significant sleep apnea, although she does have a significantly elongated uvula and palate. It is unclear whether she may have another type of sleep disorder that is causing her nonrestorative sleep and excessive daytime sleepiness. At this point, I think she needs to have a formal sleep study for evaluation. The patient is agreeable to this, and will arrange for followup once the results are available.

## 2014-09-02 NOTE — Patient Instructions (Signed)
Will schedule for a sleep study, and arrange followup once the results are available.  

## 2014-09-09 ENCOUNTER — Telehealth: Payer: Self-pay | Admitting: Internal Medicine

## 2014-09-09 MED ORDER — ALPRAZOLAM 0.5 MG PO TABS: 0.5000 mg | ORAL_TABLET | Freq: Four times a day (QID) | ORAL | Status: DC | PRN

## 2014-09-09 NOTE — Telephone Encounter (Signed)
done

## 2014-09-09 NOTE — Telephone Encounter (Signed)
As of 09/09/2014 have not received RF request from [facility] as stated.   Pt requesting refill on alprazolam  Last OV: 07/22/2014 Last Fill: 07/31/2014 #60 0RF UDS: 06/24/2014 Low risk  Please advise.

## 2014-09-09 NOTE — Telephone Encounter (Signed)
Faxed to [facility].

## 2014-09-09 NOTE — Telephone Encounter (Signed)
Caller name: Helyn Schwan Relation to pt: Call back number: [phone] Pharmacy: [id number] - [place], [place]   Reason for call: Pt requesting rx for ALPRAZolam (XANAX) 0.5 MG tablet. Pt states pharmacy sent fax yesterday and they have not receive anything. Please advise.

## 2014-09-24 ENCOUNTER — Ambulatory Visit (INDEPENDENT_AMBULATORY_CARE_PROVIDER_SITE_OTHER): Payer: Medicare Other | Admitting: Internal Medicine

## 2014-09-24 ENCOUNTER — Encounter: Payer: Self-pay | Admitting: Internal Medicine

## 2014-09-24 VITALS — BP 142/80 | HR 67 | Temp 98.0°F | Wt 102.5 lb

## 2014-09-24 DIAGNOSIS — F419 Anxiety disorder, unspecified: Secondary | ICD-10-CM

## 2014-09-24 DIAGNOSIS — Z23 Encounter for immunization: Secondary | ICD-10-CM

## 2014-09-24 DIAGNOSIS — M159 Polyosteoarthritis, unspecified: Secondary | ICD-10-CM

## 2014-09-24 DIAGNOSIS — J410 Simple chronic bronchitis: Secondary | ICD-10-CM

## 2014-09-24 DIAGNOSIS — M150 Primary generalized (osteo)arthritis: Secondary | ICD-10-CM

## 2014-09-24 MED ORDER — ESCITALOPRAM OXALATE 10 MG PO TABS: 10.0000 mg | ORAL_TABLET | Freq: Every day | ORAL | Status: DC

## 2014-09-24 MED ORDER — ALBUTEROL SULFATE HFA 108 (90 BASE) MCG/ACT IN AERS: 2.0000 | INHALATION_SPRAY | Freq: Four times a day (QID) | RESPIRATORY_TRACT | Status: DC

## 2014-09-24 MED ORDER — TIOTROPIUM BROMIDE MONOHYDRATE 18 MCG IN CAPS: 18.0000 ug | ORAL_CAPSULE | Freq: Every day | RESPIRATORY_TRACT | Status: DC

## 2014-09-24 NOTE — Assessment & Plan Note (Addendum)
Complain of generalized pain: Back, knees, finger. Symptoms not well-controlled, states  I don't give her enough Vicodin but at the same time states cannot take Tylenol because it causes nausea(which is a contradiction). I asked her about tramadol and states she can't take it because it "burns the stomach ". History of PUD, I don't think she needs to be taking NSAIDs. 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 Plan:  for now I don't have much else to offer except Vicodin as needed. No more than 30 a month. Interestingly, the only option the patient the patient gave me is to increase narcotics which is not appropriate for dates DJD treatment. Requested x-ray of the back which we will get offered ortho referral

## 2014-09-24 NOTE — Patient Instructions (Signed)
Get your blood work before you leave   Stop by the first floor and get the XR   COPD: Spiriva daily Albuterol if cough wheezing  Start Lexapro for anxiety, 1 tablet daily  Next visit in 2 months

## 2014-09-24 NOTE — Assessment & Plan Note (Addendum)
Reports anxiety is not well-controlled because "I don't give her enough Xanax". I recommend to try SSRI, in the past she took them for depression and did not  help but  this time her main concern is anxiety Plan:  continue low-dose of Xanax, no more than 60 a month Start Lexapro Labs  Interestingly the patient likes to simply get more Xanax which is not appropriately unless she fails to a number of SSRIs and other medications.

## 2014-09-24 NOTE — Progress Notes (Signed)
Subjective:    Patient ID: Casey Schmidt, female    DOB: 1956/03/25, 58 y.o.   MRN: [id number]  DOS:  09/24/2014 Type of visit - description : f/u Interval history: DJD, continue with generalized pain, currently symptoms not well-controlled. See assessment and plan Sleep apnea, studies pending. Anxiety, currently symptoms not well-controlled, see assessment and plan Cough, ongoing issue, more frequent and intense?.   ROS Denies fever chills Mild difficulty breathing sometimes. No sputum production, hemoptysis. Occasional wheezing.   Past Medical History  Diagnosis Date  . Arthritis   . COPD (chronic obstructive pulmonary disease)   . GERD (gastroesophageal reflux disease)   . Multiple gastric ulcers     d/t BC powders  . Hypertension   . Migraines     h/o  . Anxiety     Past Surgical History  Procedure Laterality Date  . Right oophorectomy    . Wrist surgery Right 2015    fx  . Tubal ligation    . Ganglion cyst removal Left remotely     from wrist    History   Social History  . Marital Status: Widowed    Spouse Name: N/A    Number of Children: 2  . Years of Education: N/A   Occupational History  . retired-disability d/t anxiety-djd    Social History Main Topics  . Smoking status: Current Every Day Smoker -- 0.25 packs/day for 32 years    Types: Cigarettes  . Smokeless tobacco: Never Used     Comment: smoking again as of 09-2014   . Alcohol Use: 0.0 oz/week    0 Not specified per week     Comment: rare  . Drug Use: No  . Sexual Activity: Not on file   Other Topics Concern  . Not on file   Social History Narrative   Lost husband 2014        Medication List       This list is accurate as of: 09/24/14 11:59 PM.  Always use your most recent med list.               albuterol 108 (90 BASE) MCG/ACT inhaler  Commonly known as:  VENTOLIN HFA  Inhale 2 puffs into the lungs 4 (four) times daily.     ALPRAZolam 0.5 MG tablet  Commonly  known as:  XANAX  Take 1 tablet (0.5 mg total) by mouth 4 (four) times daily as needed.     escitalopram 10 MG tablet  Commonly known as:  LEXAPRO  Take 1 tablet (10 mg total) by mouth daily.     famotidine 20 MG tablet  Commonly known as:  PEPCID  Take 20 mg by mouth at bedtime.     HYDROcodone-acetaminophen 5-325 MG per tablet  Commonly known as:  NORCO/VICODIN  Take 1 tablet by mouth every 8 (eight) hours as needed.     pantoprazole 40 MG tablet  Commonly known as:  PROTONIX  1 tablet daily.     tiotropium 18 MCG inhalation capsule  Commonly known as:  SPIRIVA HANDIHALER  Place 1 capsule (18 mcg total) into inhaler and inhale daily.           Objective:   Physical Exam BP 142/80 mmHg  Pulse 67  Temp(Src) 98 F (36.7 C) (Oral)  Wt 102 lb 8 oz (46.494 kg)  SpO2 95%  General -- alert, well-developed, NAD.   Lungs -- normal respiratory effort, no intercostal retractions, no accessory muscle use, anddecreased breath  sounds.  Heart-- normal rate, regular rhythm, no murmur.    Extremities-- no pretibial edema bilaterally  Neurologic--  alert & oriented X3. Speech normal, gait appropriate for age, strength symmetric and appropriate for age.    Psych-- Cognition and judgment appear intact. Cooperative with normal attention span and concentration. No anxious or depressed appearing.       Assessment & Plan:   [id number] >> 25 min

## 2014-09-24 NOTE — Progress Notes (Signed)
Pre visit review using our clinic review tool, if applicable. No additional management support is needed unless otherwise documented below in the visit note. 

## 2014-09-24 NOTE — Assessment & Plan Note (Addendum)
Heavy smoker, having cough and difficulty breathing. Plan: PFTs Spiriva daily, albuterol as needed labs

## 2014-09-25 ENCOUNTER — Telehealth: Payer: Self-pay | Admitting: Internal Medicine

## 2014-09-25 NOTE — Telephone Encounter (Signed)
emmi mailed  °

## 2014-09-29 ENCOUNTER — Encounter: Payer: Self-pay | Admitting: Internal Medicine

## 2014-10-01 ENCOUNTER — Ambulatory Visit (HOSPITAL_BASED_OUTPATIENT_CLINIC_OR_DEPARTMENT_OTHER): Payer: Medicare Other | Attending: Pulmonary Disease

## 2014-10-01 VITALS — Ht 64.0 in | Wt 103.0 lb

## 2014-10-01 DIAGNOSIS — R0683 Snoring: Secondary | ICD-10-CM

## 2014-10-01 DIAGNOSIS — G4730 Sleep apnea, unspecified: Secondary | ICD-10-CM | POA: Diagnosis present

## 2014-10-01 DIAGNOSIS — G4710 Hypersomnia, unspecified: Secondary | ICD-10-CM | POA: Insufficient documentation

## 2014-10-01 DIAGNOSIS — G4733 Obstructive sleep apnea (adult) (pediatric): Secondary | ICD-10-CM

## 2014-10-07 DIAGNOSIS — G4733 Obstructive sleep apnea (adult) (pediatric): Secondary | ICD-10-CM

## 2014-10-07 NOTE — Progress Notes (Signed)
Mindy, let pt know that her sleep study does not show sleep apnea or any other nighttime sleep disorder.  I suspect part of her issue is related to chronic pain or some degree of insomnia.  Let her know that I will send a copy of this note to Dr. Larose Kells.

## 2014-10-07 NOTE — Sleep Study (Signed)
   NAME: Casey Schmidt DATE OF BIRTH:  1956-05-12 MEDICAL RECORD NUMBER [id number]  LOCATION: [facility]  PHYSICIAN: [place] OF STUDY: 10/01/2014  SLEEP STUDY TYPE: Nocturnal Polysomnogram               REFERRING PHYSICIAN: Clance, Armando Reichert, MD  INDICATION FOR STUDY: Hypersomnia with sleep apnea  EPWORTH SLEEPINESS SCORE:  13 HEIGHT: 5\' 4"  (162.6 cm)  WEIGHT: 103 lb (46.72 kg)    Body mass index is 17.67 kg/(m^2).  NECK SIZE: 12.5 in.  MEDICATIONS: reviewed in the sleep record  SLEEP ARCHITECTURE: the patient had a total sleep time of 247 minutes, with little slow-wave sleep and no REM. Sleep onset latency was mildly prolonged at 37 minutes, and sleep efficiency was poor at 68%.  RESPIRATORY DATA: the patient was found to have 3 apneas and only one obstructive hypoxemia, giving her an AHI of only one event per hour. The events occurred all in the supine position, and there was moderate snoring noted throughout. The patient did not achieve REM during the night, but had adequate total sleep time for evaluation  OXYGEN DATA: there was transient oxygen desaturation as low as 89%  CARDIAC DATA: no clinically significant arrhythmias were seen  MOVEMENT/PARASOMNIA: no periodic limb movements or abnormal behaviors were noted.  IMPRESSION/ RECOMMENDATION:    1) small numbers of obstructive events which do not meet the AHI criteria for the obstructive sleep apnea syndrome. The patient did not achieve REM, but had adequate total sleep time for evaluation. She did have moderate snoring noted throughout.  Clinical correlation is recommended regarding the cause of the patient's sleepiness complaints.      [place], American Board of Sleep Medicine  ELECTRONICALLY SIGNED ON:  10/07/2014, 5:48 PM [place] PH: [phone]   FX: [phone] [place]

## 2014-10-08 ENCOUNTER — Telehealth: Payer: Self-pay | Admitting: Internal Medicine

## 2014-10-08 NOTE — Telephone Encounter (Signed)
Pt is requesting refill on Hydrocodone.  Last OV: 09/24/2014 Last Fill: 08/24/2014 # 30 0RF UDS: 06/24/2014 Low risk   Please advise.

## 2014-10-08 NOTE — Telephone Encounter (Signed)
Caller name: Casey Schmidt Relation to pt: self Call back number: [phone] Pharmacy:  Reason for call:   Patient requesting a new hyrdrocodone rx

## 2014-10-08 NOTE — Progress Notes (Signed)
[id number] x1 

## 2014-10-09 MED ORDER — HYDROCODONE-ACETAMINOPHEN 5-325 MG PO TABS: 1.0000 | ORAL_TABLET | Freq: Three times a day (TID) | ORAL | Status: DC | PRN

## 2014-10-09 NOTE — Telephone Encounter (Signed)
done

## 2014-10-09 NOTE — Progress Notes (Signed)
I spoke with patient about results and she verbalized understanding and had no questions 

## 2014-10-09 NOTE — Telephone Encounter (Signed)
Spoke with Pt, informed her medication is ready for pick up. Placed at front desk.

## 2014-10-11 ENCOUNTER — Telehealth: Payer: Self-pay | Admitting: Internal Medicine

## 2014-10-11 NOTE — Telephone Encounter (Signed)
A number of labs were requested during the last visit 09/24/2014, the patient did not get them done. Please arrange labs visit only

## 2014-10-12 NOTE — Telephone Encounter (Signed)
LMOM for Pt informing her that she still needs to return to office and have NON FASTING labs drawn from [facility] 09/24/2014. Lab orders already placed.

## 2014-10-14 NOTE — Telephone Encounter (Signed)
Letter printed and mailed to Pt informing her she did not have her labs drawn on 11/19, she still needs to return to the office to have labs drawn non fasting.

## 2014-10-28 ENCOUNTER — Ambulatory Visit: Payer: Medicare Other | Admitting: Internal Medicine

## 2014-10-29 ENCOUNTER — Ambulatory Visit: Payer: Medicare Other | Admitting: Internal Medicine

## 2014-10-30 ENCOUNTER — Encounter: Payer: Self-pay | Admitting: Internal Medicine

## 2014-10-30 ENCOUNTER — Ambulatory Visit (HOSPITAL_BASED_OUTPATIENT_CLINIC_OR_DEPARTMENT_OTHER)
Admission: RE | Admit: 2014-10-30 | Discharge: 2014-10-30 | Disposition: A | Discharge status: Home / Self Care | Payer: Medicare Other | Source: Ambulatory Visit | Attending: Internal Medicine | Admitting: Internal Medicine

## 2014-10-30 ENCOUNTER — Ambulatory Visit (INDEPENDENT_AMBULATORY_CARE_PROVIDER_SITE_OTHER): Payer: Medicare Other | Admitting: Internal Medicine

## 2014-10-30 VITALS — BP 139/85 | HR 92 | Temp 97.9°F | Wt 102.0 lb

## 2014-10-30 DIAGNOSIS — J449 Chronic obstructive pulmonary disease, unspecified: Secondary | ICD-10-CM

## 2014-10-30 DIAGNOSIS — M19039 Primary osteoarthritis, unspecified wrist: Secondary | ICD-10-CM

## 2014-10-30 DIAGNOSIS — R636 Underweight: Secondary | ICD-10-CM | POA: Insufficient documentation

## 2014-10-30 DIAGNOSIS — M150 Primary generalized (osteo)arthritis: Secondary | ICD-10-CM

## 2014-10-30 DIAGNOSIS — M159 Polyosteoarthritis, unspecified: Secondary | ICD-10-CM

## 2014-10-30 DIAGNOSIS — R0683 Snoring: Secondary | ICD-10-CM

## 2014-10-30 DIAGNOSIS — F419 Anxiety disorder, unspecified: Secondary | ICD-10-CM

## 2014-10-30 DIAGNOSIS — M8580 Other specified disorders of bone density and structure, unspecified site: Secondary | ICD-10-CM

## 2014-10-30 DIAGNOSIS — J410 Simple chronic bronchitis: Secondary | ICD-10-CM

## 2014-10-30 LAB — CBC WITH DIFFERENTIAL/PLATELET
BASOS PCT: 0.7 % (ref 0.0–3.0)
Basophils Absolute: 0.1 10*3/uL (ref 0.0–0.1)
EOS PCT: 1.4 % (ref 0.0–5.0)
Eosinophils Absolute: 0.1 10*3/uL (ref 0.0–0.7)
HCT: 39.9 % (ref 36.0–46.0)
HEMOGLOBIN: 13.2 g/dL (ref 12.0–15.0)
Lymphocytes Relative: 19 % (ref 12.0–46.0)
Lymphs Abs: 2 10*3/uL (ref 0.7–4.0)
MCHC: 33.1 g/dL (ref 30.0–36.0)
MCV: 92.2 fl (ref 78.0–100.0)
Monocytes Absolute: 1.1 10*3/uL — ABNORMAL HIGH (ref 0.1–1.0)
Monocytes Relative: 10.7 % (ref 3.0–12.0)
NEUTROS ABS: 7.2 10*3/uL (ref 1.4–7.7)
NEUTROS PCT: 68.2 % (ref 43.0–77.0)
Platelets: 223 10*3/uL (ref 150.0–400.0)
RBC: 4.32 Mil/uL (ref 3.87–5.11)
RDW: 14.1 % (ref 11.5–15.5)
WBC: 10.5 10*3/uL (ref 4.0–10.5)

## 2014-10-30 LAB — COMPREHENSIVE METABOLIC PANEL
ALBUMIN: 4.4 g/dL (ref 3.5–5.2)
ALT: 14 U/L (ref 0–35)
AST: 23 U/L (ref 0–37)
Alkaline Phosphatase: 96 U/L (ref 39–117)
BUN: 10 mg/dL (ref 6–23)
CO2: 24 mEq/L (ref 19–32)
Calcium: 9.1 mg/dL (ref 8.4–10.5)
Chloride: 100 mEq/L (ref 96–112)
Creatinine, Ser: 1 mg/dL (ref 0.4–1.2)
GFR: 64.45 mL/min (ref >60.00)
GLUCOSE: 82 mg/dL (ref 70–99)
POTASSIUM: 3.8 meq/L (ref 3.5–5.1)
Sodium: 133 mEq/L — ABNORMAL LOW (ref 135–145)
Total Bilirubin: 0.4 mg/dL (ref 0.2–1.2)
Total Protein: 7.2 g/dL (ref 6.0–8.3)

## 2014-10-30 LAB — SEDIMENTATION RATE: Sed Rate: 10 mm/hr (ref 0–22)

## 2014-10-30 LAB — C-REACTIVE PROTEIN

## 2014-10-30 LAB — RHEUMATOID FACTOR

## 2014-10-30 LAB — TSH: TSH: 3 u[IU]/mL (ref 0.35–4.50)

## 2014-10-30 MED ORDER — SERTRALINE HCL 100 MG PO TABS: 100.0000 mg | ORAL_TABLET | Freq: Every day | ORAL | Status: DC

## 2014-10-30 NOTE — Progress Notes (Signed)
Subjective:    Patient ID: Casey Schmidt, female    DOB: 07-Dec-1955, 58 y.o.   MRN: [id number]  DOS:  10/30/2014 Type of visit - description : Acute, not feeling better Interval history: Generalized aches and pains, unchanged from previous. Had the sleep study, likes to discuss results Anxiety, took Lexapro for a month, not better.  ROS Occasional headaches. No fever or chills No joint swelling or redness. No rash  Past Medical History  Diagnosis Date  . Arthritis   . COPD (chronic obstructive pulmonary disease)   . GERD (gastroesophageal reflux disease)   . Multiple gastric ulcers     d/t BC powders  . Hypertension   . Migraines     h/o  . Anxiety     Past Surgical History  Procedure Laterality Date  . Right oophorectomy    . Wrist surgery Right 2015    fx  . Tubal ligation    . Ganglion cyst removal Left remotely     from wrist    History   Social History  . Marital Status: Widowed    Spouse Name: N/A    Number of Children: 2  . Years of Education: N/A   Occupational History  . retired-disability d/t anxiety-djd    Social History Main Topics  . Smoking status: Current Every Day Smoker -- 0.25 packs/day for 32 years    Types: Cigarettes  . Smokeless tobacco: Never Used     Comment: smoking again as of 09-2014   . Alcohol Use: 0.0 oz/week    0 Not specified per week     Comment: rare  . Drug Use: No  . Sexual Activity: Not on file   Other Topics Concern  . Not on file   Social History Narrative   Lost husband 2014        Medication List       This list is accurate as of: 10/30/14 11:51 AM.  Always use your most recent med list.               albuterol 108 (90 BASE) MCG/ACT inhaler  Commonly known as:  VENTOLIN HFA  Inhale 2 puffs into the lungs 4 (four) times daily.     ALPRAZolam 0.5 MG tablet  Commonly known as:  XANAX  Take 1 tablet (0.5 mg total) by mouth 4 (four) times daily as needed.     escitalopram 10 MG tablet    Commonly known as:  LEXAPRO  Take 1 tablet (10 mg total) by mouth daily.     famotidine 20 MG tablet  Commonly known as:  PEPCID  Take 20 mg by mouth at bedtime.     HYDROcodone-acetaminophen 5-325 MG per tablet  Commonly known as:  NORCO/VICODIN  Take 1 tablet by mouth every 8 (eight) hours as needed.     pantoprazole 40 MG tablet  Commonly known as:  PROTONIX  1 tablet daily.     tiotropium 18 MCG inhalation capsule  Commonly known as:  SPIRIVA HANDIHALER  Place 1 capsule (18 mcg total) into inhaler and inhale daily.           Objective:   Physical Exam BP 139/85 mmHg  Pulse 92  Temp(Src) 97.9 F (36.6 C) (Oral)  Wt 102 lb (46.267 kg)  SpO2 96% General -- alert, well-developed, NAD.  Neck --no   LADs Lungs -- normal respiratory effort, no intercostal retractions, no accessory muscle use, and decreased breath sounds.  Heart-- normal rate, regular  rhythm, no murmur.   Extremities-- no pretibial edema bilaterally ;wrists w/ TTP but no swelling, redness or effusion. Neurologic--  alert & oriented X3. Speech normal, gait appropriate for age, strength symmetric and appropriate for age.  Psych-- Cognition and judgment appear intact. Cooperative with normal attention span and concentration. Not depressed but seemed frustrated by her symptoms     Assessment & Plan:  Today , I spent more than  25  min with the patient: >50% of the time counseling regards anxiety, treatment options, need to see psychiaty (pt declined), discussing results w/ other MDs

## 2014-10-30 NOTE — Progress Notes (Signed)
Pre visit review using our clinic review tool, if applicable. No additional management support is needed unless otherwise documented below in the visit note. 

## 2014-10-30 NOTE — Patient Instructions (Signed)
Get your blood work before you leave   Stop by the first floor and get the XR     Stop Lexapro, start sertraline 100 mg: The first 10 days take half tablet daily, then take 1 tablet a day  Next visit in one month  We need to get the records from your previous doctor at [facility], sign a release of information before you leave ( at the front desk)

## 2014-10-31 NOTE — Assessment & Plan Note (Signed)
Generalized aches and pains. Likely DJD. Will check a sedimentation rate, CRP, ANA, rheumatoid factor. Refer to rheumatology. See previous entry regards pain management.

## 2014-10-31 NOTE — Assessment & Plan Note (Signed)
PFTs pending, CXR

## 2014-10-31 NOTE — Assessment & Plan Note (Addendum)
States she is not better, took Lexapro 10 mg for one month without apparent benefit. Plan: Switch to Zoloft, psychiatry referral (declined referral for now)

## 2014-10-31 NOTE — Assessment & Plan Note (Signed)
Had a sleep study, neg for OSA, results  discussed with pulmonology:  sleep complaints were probably due to chronic pain and insomnia.  if the snoring itself bothers her rx options include a dental appliance or ENT to look at her upper airway (may help snoring but not sleeping)

## 2014-10-31 NOTE — Assessment & Plan Note (Signed)
Patient is underweight but no weight loss over  the last 4 months. Labs

## 2014-11-02 LAB — ANA: Anti Nuclear Antibody(ANA): NEGATIVE

## 2014-11-03 ENCOUNTER — Telehealth: Payer: Self-pay | Admitting: Internal Medicine

## 2014-11-03 NOTE — Telephone Encounter (Signed)
Spoke with Pt, informed her of lab results and x-ray results. Pt verbalized understanding. Pt informed me she believes she has bronchitis. Pt has appt scheduled for 11/04/2014 at 1600, but informed me that she "didn't know if she can make it until then", instructed her if she felt that bad she needs to go on to ED right now. Pt verbalized understanding and stated she would.

## 2014-11-03 NOTE — Telephone Encounter (Signed)
Caller name:Pacey Geni Bers Relation to OX:BDZH Call back [phone] Pharmacy:  Reason for call: pt is returning your call, please call back

## 2014-11-03 NOTE — Addendum Note (Signed)
Addended by: Wilfrid Lund on: 11/03/2014 04:25 PM   Modules accepted: Orders, Medications

## 2014-11-03 NOTE — Telephone Encounter (Signed)
Please check on her in the morning

## 2014-11-04 ENCOUNTER — Encounter: Payer: Self-pay | Admitting: Internal Medicine

## 2014-11-04 ENCOUNTER — Ambulatory Visit (INDEPENDENT_AMBULATORY_CARE_PROVIDER_SITE_OTHER): Payer: Medicare Other | Admitting: Internal Medicine

## 2014-11-04 VITALS — BP 112/68 | HR 111 | Temp 98.0°F | Wt 96.2 lb

## 2014-11-04 DIAGNOSIS — J40 Bronchitis, not specified as acute or chronic: Secondary | ICD-10-CM

## 2014-11-04 MED ORDER — PREDNISONE 10 MG PO TABS: ORAL_TABLET | ORAL | Status: DC

## 2014-11-04 MED ORDER — AMOXICILLIN 500 MG PO CAPS: 1000.0000 mg | ORAL_CAPSULE | Freq: Two times a day (BID) | ORAL | Status: DC

## 2014-11-04 MED ORDER — BENZONATATE 200 MG PO CAPS: 200.0000 mg | ORAL_CAPSULE | Freq: Three times a day (TID) | ORAL | Status: DC | PRN

## 2014-11-04 MED ORDER — HYDROCODONE-ACETAMINOPHEN 5-325 MG PO TABS: 1.0000 | ORAL_TABLET | Freq: Three times a day (TID) | ORAL | Status: DC | PRN

## 2014-11-04 MED ORDER — DOXYCYCLINE HYCLATE 100 MG PO TABS: 100.0000 mg | ORAL_TABLET | Freq: Two times a day (BID) | ORAL | Status: DC

## 2014-11-04 NOTE — Telephone Encounter (Signed)
Pt has acute appt today (11/04/2014) at 4.

## 2014-11-04 NOTE — Progress Notes (Signed)
Pre visit review using our clinic review tool, if applicable. No additional management support is needed unless otherwise documented below in the visit note. 

## 2014-11-04 NOTE — Progress Notes (Signed)
Subjective:    Patient ID: Casey Schmidt, female    DOB: 06/12/1956, 58 y.o.   MRN: [id number]  DOS:  11/04/2014 Type of visit - description : acute Interval history: Symptoms started 2 days ago with cough and having some trouble breathing. Cough is dry, good compliance with Spiriva and taking albuterol as needed. Denies sick contacts   ROS Denies fever or chills but sometimes she feels hot/cold. Mild sore throat, denies runny nose, sinus pain or congestion. Anterior chest pain only with cough. No lower extremity edema + whhezing since the symptoms started  Past Medical History  Diagnosis Date  . Arthritis   . COPD (chronic obstructive pulmonary disease)   . GERD (gastroesophageal reflux disease)   . Multiple gastric ulcers     d/t BC powders  . Hypertension   . Migraines     h/o  . Anxiety     Past Surgical History  Procedure Laterality Date  . Right oophorectomy    . Wrist surgery Right 2015    fx  . Tubal ligation    . Ganglion cyst removal Left remotely     from wrist    History   Social History  . Marital Status: Widowed    Spouse Name: N/A    Number of Children: 2  . Years of Education: N/A   Occupational History  . retired-disability d/t anxiety-djd    Social History Main Topics  . Smoking status: Current Every Day Smoker -- 0.25 packs/day for 32 years    Types: Cigarettes  . Smokeless tobacco: Never Used     Comment: smoking again as of 09-2014   . Alcohol Use: 0.0 oz/week    0 Not specified per week     Comment: rare  . Drug Use: No  . Sexual Activity: Not on file   Other Topics Concern  . Not on file   Social History Narrative   Lost husband 2014        Medication List       This list is accurate as of: 11/04/14 11:59 PM.  Always use your most recent med list.               albuterol 108 (90 BASE) MCG/ACT inhaler  Commonly known as:  VENTOLIN HFA  Inhale 2 puffs into the lungs 4 (four) times daily.     ALPRAZolam  0.5 MG tablet  Commonly known as:  XANAX  Take 1 tablet (0.5 mg total) by mouth 4 (four) times daily as needed.     benzonatate 200 MG capsule  Commonly known as:  TESSALON  Take 1 capsule (200 mg total) by mouth 3 (three) times daily as needed for cough.     doxycycline 100 MG tablet  Commonly known as:  VIBRA-TABS  Take 1 tablet (100 mg total) by mouth 2 (two) times daily.     famotidine 20 MG tablet  Commonly known as:  PEPCID  Take 20 mg by mouth at bedtime.     HYDROcodone-acetaminophen 5-325 MG per tablet  Commonly known as:  NORCO/VICODIN  Take 1 tablet by mouth every 8 (eight) hours as needed.     pantoprazole 40 MG tablet  Commonly known as:  PROTONIX  1 tablet daily.     predniSONE 10 MG tablet  Commonly known as:  DELTASONE  4 tablets x 2 days, 3 tabs x 2 days, 2 tabs x 2 days, 1 tab x 2 days     sertraline  100 MG tablet  Commonly known as:  ZOLOFT  Take 1 tablet (100 mg total) by mouth daily.     tiotropium 18 MCG inhalation capsule  Commonly known as:  SPIRIVA HANDIHALER  Place 1 capsule (18 mcg total) into inhaler and inhale daily.           Objective:   Physical Exam BP 112/68 mmHg  Pulse 111  Temp(Src) 98 F (36.7 C) (Oral)  Wt 96 lb 4 oz (43.659 kg)  SpO2 94%  General -- alert, well-developed, slightly tachycardic but not toxic appearing  HEENT-- Not pale.  R Ear-- normal L ear-- normal Throat symmetric, no redness or discharge. Face symmetric, sinuses not tender to palpation. Nose slt congested.  Lungs -- normal respiratory effort, no intercostal retractions, no accessory muscle use,  few rhonchi with cough, slightly decreased breath sounds. Heart-- slightly tachycardic, no murmur   Extremities-- no pretibial edema bilaterally  Neurologic--  alert & oriented X3. Speech normal, gait appropriate for age, strength symmetric and appropriate for age.  Psych-- Cognition and judgment appear intact. Cooperative with normal attention span and  concentration. No anxious or depressed appearing.        Assessment & Plan:   COPD, Likely COPD exacerbated by bronchitis. subjectively short of breath and heart rate is slightly elevated but not toxic appearing. We talk about possibly doing a chest x-ray--- patient declined. Plan: Doxycycline, prednisone, Tessalon Perles. See instructions. Continue Spiriva and Ventolin

## 2014-11-04 NOTE — Patient Instructions (Addendum)
Rest, fluids , tylenol If  cough, take Mucinex DM twice a day as needed  If the cough continue, try Tessalon Perles Prednisone as prescribed  If nasal  congestion use OTC Nasocort or Flonase : 2 nasal sprays on each side of the nose daily until you feel better Take the antibiotic as prescribed  (doxy) Call if not gradually better over the next  10 days

## 2014-11-05 ENCOUNTER — Telehealth: Payer: Self-pay | Admitting: Internal Medicine

## 2014-11-05 NOTE — Telephone Encounter (Signed)
emmi mailed  °

## 2014-11-08 ENCOUNTER — Telehealth: Payer: Self-pay | Admitting: Internal Medicine

## 2014-11-08 NOTE — Telephone Encounter (Signed)
Call [facility], abnormal MMG 10-15, was recommended to have a f/u MMG, please get f/u mmg report, if not done, please schedule

## 2014-11-10 ENCOUNTER — Telehealth: Payer: Self-pay | Admitting: Internal Medicine

## 2014-11-10 DIAGNOSIS — J441 Chronic obstructive pulmonary disease with (acute) exacerbation: Secondary | ICD-10-CM

## 2014-11-10 MED ORDER — DOXYCYCLINE HYCLATE 100 MG PO TABS: 100.0000 mg | ORAL_TABLET | Freq: Two times a day (BID) | ORAL | Status: DC

## 2014-11-10 NOTE — Telephone Encounter (Signed)
Please advise 

## 2014-11-10 NOTE — Telephone Encounter (Signed)
Caller name:Schlachter, Muna Relation to GF:REVQ Call back number:[phone] Pharmacy:[email or web address]  Reason for call: pt was seen 12/16 pt states her last antibiotic is today, pt still does not feel well at all, states she is very fatigue and sore in back and chest would like to know if she need another antibiotic called in.

## 2014-11-10 NOTE — Telephone Encounter (Signed)
Please call 6 additional doxycycline tablets to complete 10 days of Rx Schedule a chest x-ray----- DX COPD Office visit next week if not much better

## 2014-11-10 NOTE — Telephone Encounter (Signed)
Noted  

## 2014-11-10 NOTE — Telephone Encounter (Signed)
Contacted the pt, pt states she will get the xray done, and if not better next week she will make an appt to be seen

## 2014-11-10 NOTE — Telephone Encounter (Signed)
Please inform Pt, 6 additional tablets of [facility]cycline [facility]. Dr. Larose Kells would like her to have another chest x-ray done (order placed), she can come to first floor of [facility] any time to have that done. And if she is not feeling better next week she will need to be seen again.

## 2014-11-11 MED ORDER — PREDNISONE 10 MG PO TABS: ORAL_TABLET | ORAL | Status: DC

## 2014-11-11 NOTE — Addendum Note (Signed)
Addended by: Wilfrid Lund on: 11/11/2014 01:08 PM   Modules accepted: Orders

## 2014-11-11 NOTE — Telephone Encounter (Signed)
Send a new prescription: Prednisone 10 mg 2 tablets daily for 3 days, then 1 tablet daily for 3 days, #9, no refills

## 2014-11-11 NOTE — Telephone Encounter (Signed)
Please advise 

## 2014-11-11 NOTE — Telephone Encounter (Signed)
Pt is now requesting more predniSONE (DELTASONE) 10 MG,

## 2014-11-11 NOTE — Telephone Encounter (Signed)
Prednisone refilled with new instructions, # 9 and 0 refills, sent to [facility].

## 2014-11-12 NOTE — Telephone Encounter (Signed)
Tried [employer], on hold for 5 minutes, will retry Monday.

## 2014-11-16 NOTE — Telephone Encounter (Signed)
thx

## 2014-11-16 NOTE — Telephone Encounter (Signed)
Spoke with [facility], Pt was seen again for diagnostic mammogram on 09/21/2014, requested they fax results to [place]. Informed they would fax them to [place].

## 2014-11-18 ENCOUNTER — Telehealth: Payer: Self-pay | Admitting: Pulmonary Disease

## 2014-11-18 NOTE — Telephone Encounter (Signed)
Called and spoke to pt. Pt requested sleep study results. Address confirmed with pt. Sleep study placed in outgoing mail. Pt aware. Nothing further needed.

## 2014-11-23 ENCOUNTER — Telehealth: Payer: Self-pay | Admitting: Internal Medicine

## 2014-11-23 NOTE — Telephone Encounter (Signed)
done

## 2014-11-23 NOTE — Telephone Encounter (Signed)
Pt is requesting refill on Alprazolam.  Last OV: 11/04/2014 Last Fill: 09/09/2014 # 60 1 RF UDS: 06/24/2014 Low risk  Please advise.

## 2014-11-24 NOTE — Telephone Encounter (Signed)
[facility]ed to [facility]

## 2014-11-30 ENCOUNTER — Ambulatory Visit: Payer: Medicare Other | Admitting: Internal Medicine

## 2014-12-07 ENCOUNTER — Telehealth: Payer: Self-pay | Admitting: Internal Medicine

## 2014-12-07 MED ORDER — HYDROCODONE-ACETAMINOPHEN 5-325 MG PO TABS: 1.0000 | ORAL_TABLET | Freq: Three times a day (TID) | ORAL | Status: DC | PRN

## 2014-12-07 NOTE — Telephone Encounter (Signed)
Pt is requesting refill on Hydrocodone.  Last OV: 11/04/2014 Last Fill: 11/04/2014 # 30 0RF UDS: 06/24/2014 Low risk  Please advise.

## 2014-12-07 NOTE — Telephone Encounter (Signed)
done

## 2014-12-07 NOTE — Telephone Encounter (Signed)
Spoke with Pt, informed Pt that Rx is ready for pick up at front desk.

## 2014-12-07 NOTE — Telephone Encounter (Signed)
Caller name: Akaylah, Lalley Relation to pt: self  Call back number: [phone]   Reason for call:  Pt requesting a refill of HYDROcodone-acetaminophen (NORCO/VICODIN) 5-325 MG per tablet

## 2014-12-24 ENCOUNTER — Other Ambulatory Visit: Payer: Self-pay | Admitting: Internal Medicine

## 2014-12-25 ENCOUNTER — Other Ambulatory Visit: Payer: Self-pay | Admitting: Internal Medicine

## 2015-01-05 ENCOUNTER — Telehealth: Payer: Self-pay | Admitting: Internal Medicine

## 2015-01-05 MED ORDER — HYDROCODONE-ACETAMINOPHEN 5-325 MG PO TABS: 1.0000 | ORAL_TABLET | Freq: Three times a day (TID) | ORAL | Status: DC | PRN

## 2015-01-05 NOTE — Telephone Encounter (Signed)
rx printed

## 2015-01-05 NOTE — Telephone Encounter (Signed)
Pt is requesting refill on Hydrocodone.  Last OV: 11/04/2014 Last Fill: 12/07/2014 # 30 0RF UDS: 06/24/2014 Low risk  Please advise.

## 2015-01-05 NOTE — Telephone Encounter (Signed)
Caller name: Kemonie, Cutillo Relation to pt: self  Call back number: [phone]   Reason for call:  Pt requesting a refill HYDROcodone-acetaminophen (NORCO/VICODIN) 5-325 MG per tablet

## 2015-01-06 NOTE — Telephone Encounter (Signed)
LMOM informing Pt that Rx is ready for pick up at front desk.

## 2015-02-03 ENCOUNTER — Telehealth: Payer: Self-pay | Admitting: Internal Medicine

## 2015-02-03 MED ORDER — HYDROCODONE-ACETAMINOPHEN 5-325 MG PO TABS: 1.0000 | ORAL_TABLET | Freq: Three times a day (TID) | ORAL | Status: DC | PRN

## 2015-02-03 NOTE — Telephone Encounter (Signed)
Rx printed, awaiting signature by Dr. Paz.  

## 2015-02-03 NOTE — Telephone Encounter (Signed)
Ok #30, no RF 

## 2015-02-03 NOTE — Telephone Encounter (Signed)
Spoke with Pt informed Pt that Rx is ready for pick up at front desk.

## 2015-02-03 NOTE — Telephone Encounter (Signed)
Pt is requesting refill on Hydrocodone.  Last OV: 11/04/2014 Last Fill: 01/05/2015 # 30 0RF UDS: 06/24/2014 Low risk  Please advise.

## 2015-02-03 NOTE — Telephone Encounter (Signed)
Caller name: Inda Relation to pt: self Call back number: [phone] Pharmacy:  Reason for call:   Requesting hydrocodone rx

## 2015-02-12 ENCOUNTER — Other Ambulatory Visit: Payer: Self-pay | Admitting: Internal Medicine

## 2015-02-15 NOTE — Telephone Encounter (Signed)
Ok , print 60 and 2

## 2015-02-15 NOTE — Telephone Encounter (Signed)
Rx printed, awaiting signature by Dr. Paz.  

## 2015-02-15 NOTE — Telephone Encounter (Signed)
[facility] to [facility].  

## 2015-02-15 NOTE — Telephone Encounter (Signed)
Pt is requesting refill on Alprazolam:  Last OV: 11/04/2014 Last Fill: 11/23/2014 # 60 2RF UDS: 06/24/2014 Low risk  Please advise.

## 2015-03-09 ENCOUNTER — Telehealth: Payer: Self-pay | Admitting: Internal Medicine

## 2015-03-09 MED ORDER — HYDROCODONE-ACETAMINOPHEN 5-325 MG PO TABS: 1.0000 | ORAL_TABLET | Freq: Three times a day (TID) | ORAL | Status: DC | PRN

## 2015-03-09 NOTE — Telephone Encounter (Signed)
Caller name:Shanah Magri Relationship to patient:self Can be reached:[phone]   Reason for call: Pt requesting refill of Hydrocodone-  Last OV: 11/04/2014  Last refill:01/2015

## 2015-03-09 NOTE — Telephone Encounter (Signed)
Spoke with Pt, informed that Rx's are ready to pick up at front desk. Controlled substance contract printed.

## 2015-03-09 NOTE — Telephone Encounter (Signed)
Pt is requesting refill on Hydrocodone.   Last OV: 11/04/2014 Last Fill: 02/03/2015 # 30 0RF UDS: 06/24/2014 Low risk  Please advise.

## 2015-03-09 NOTE — Telephone Encounter (Signed)
Rx's printed, awaiting MD signature.  

## 2015-03-09 NOTE — Telephone Encounter (Signed)
Ok 2 prescriptions, 30 each

## 2015-05-10 ENCOUNTER — Other Ambulatory Visit: Payer: Self-pay | Admitting: Internal Medicine

## 2015-05-10 NOTE — Telephone Encounter (Signed)
Pt is requesting refill on Alprazolam.  Last OV: 11/04/2014 Last Fill: 02/15/2015 #60 2RF UDS: 06/24/2014 Low risk  Please advise.

## 2015-05-10 NOTE — Telephone Encounter (Signed)
Rx printed, awaiting MD signature. Letter printed and mailed to Pt informing her that she is due for a routine OV.

## 2015-05-10 NOTE — Telephone Encounter (Signed)
[facility]  

## 2015-05-10 NOTE — Telephone Encounter (Signed)
Okay #60, no refills, schedule office visit please

## 2015-06-04 ENCOUNTER — Telehealth: Payer: Self-pay | Admitting: Internal Medicine

## 2015-06-04 MED ORDER — HYDROCODONE-ACETAMINOPHEN 5-325 MG PO TABS: 1.0000 | ORAL_TABLET | Freq: Three times a day (TID) | ORAL | Status: DC | PRN

## 2015-06-04 NOTE — Telephone Encounter (Signed)
Rx printed, awaiting MD signature.  

## 2015-06-04 NOTE — Telephone Encounter (Signed)
Pt is requesting refill on Hydrocodone. Requesting to pick up Rx today.  Last OV: 11/04/2014, CPE scheduled for 07/09/2015 Last Fill: 03/09/2015 #30 0RF UDS: 06/24/2014 Low risk  Please advise.

## 2015-06-04 NOTE — Telephone Encounter (Signed)
Relation to pt: self  Call back number: [phone]   Reason for call:  Pt requesting a refill HYDROcodone-acetaminophen (NORCO/VICODIN) 5-325 MG per tablet. Would like to pick up RX today

## 2015-06-04 NOTE — Telephone Encounter (Signed)
Okay #30, no refills 

## 2015-06-04 NOTE — Telephone Encounter (Signed)
Please inform Pt that Rx has been placed at front desk for pick up at her convenience. Thank you.  

## 2015-06-04 NOTE — Telephone Encounter (Signed)
Pt informed

## 2015-06-07 ENCOUNTER — Other Ambulatory Visit: Payer: Self-pay | Admitting: Internal Medicine

## 2015-06-07 ENCOUNTER — Ambulatory Visit: Payer: Medicare Other | Admitting: Internal Medicine

## 2015-06-07 NOTE — Telephone Encounter (Signed)
Rx printed, awaiting MD signature.  

## 2015-06-07 NOTE — Telephone Encounter (Signed)
Okay #60, one refill

## 2015-06-07 NOTE — Telephone Encounter (Signed)
Pt is requesting refill on Alprazolam.  Last OV: 11/04/2014, CPE scheduled for 07/09/2015 Last Fill: 05/10/2015 #60 0RF UDS: 06/24/2014 Low risk  Please advise.

## 2015-06-07 NOTE — Telephone Encounter (Signed)
[facility]  

## 2015-06-17 ENCOUNTER — Telehealth: Payer: Self-pay | Admitting: Internal Medicine

## 2015-06-17 NOTE — Telephone Encounter (Signed)
Last refill: 06/04/15 for 30 with 0 (q8h PRN) LOV 11/04/14- CPE scheduled 07/09/15 UDS 06/24/14- low risk   Please advise.

## 2015-06-17 NOTE — Telephone Encounter (Signed)
Pt need ov-- esp for pain med---what she has is strong,   If pain is severe she may need to go to ER

## 2015-06-17 NOTE — Telephone Encounter (Signed)
Patient called back to check on the status of below message and states the hydrocodone is not stronger enough and would like something stronger. Patient states she is in a lot of pain.

## 2015-06-17 NOTE — Telephone Encounter (Signed)
Relation to KC:CQFJ  Call back number:[phone]   Reason for call:  Patient experiencing back pain and hand pain, patient states it may be due to arthritis. Patient does not want to schedule appointment today if HYDROcodone-acetaminophen (NORCO/VICODIN) 5-325 MG per tablet is not prescribed. Please follow up

## 2015-06-18 NOTE — Telephone Encounter (Signed)
Patient requested this info to go to [facility] specifically:   Patient states she is will discuss the following at physical but wanted to give an FYI: She was told not to take the Tylenol anymore.  She went to the Methadone clinic yesterday and they stated they will manage her pain from now on so she is not taking the below pain medication.

## 2015-06-21 NOTE — Telephone Encounter (Signed)
Just an FYI

## 2015-06-21 NOTE — Telephone Encounter (Signed)
Noted  

## 2015-06-24 ENCOUNTER — Encounter: Payer: Self-pay | Admitting: Internal Medicine

## 2015-06-24 ENCOUNTER — Ambulatory Visit (INDEPENDENT_AMBULATORY_CARE_PROVIDER_SITE_OTHER): Payer: Medicare Other | Admitting: Internal Medicine

## 2015-06-24 ENCOUNTER — Telehealth: Payer: Self-pay | Admitting: Internal Medicine

## 2015-06-24 ENCOUNTER — Other Ambulatory Visit: Payer: Self-pay

## 2015-06-24 VITALS — BP 102/68 | HR 78 | Temp 97.6°F | Ht 63.0 in | Wt 103.5 lb

## 2015-06-24 DIAGNOSIS — G894 Chronic pain syndrome: Secondary | ICD-10-CM | POA: Diagnosis not present

## 2015-06-24 DIAGNOSIS — J410 Simple chronic bronchitis: Secondary | ICD-10-CM

## 2015-06-24 DIAGNOSIS — F419 Anxiety disorder, unspecified: Secondary | ICD-10-CM

## 2015-06-24 DIAGNOSIS — Z79899 Other long term (current) drug therapy: Secondary | ICD-10-CM | POA: Diagnosis not present

## 2015-06-24 DIAGNOSIS — M19039 Primary osteoarthritis, unspecified wrist: Secondary | ICD-10-CM

## 2015-06-24 MED ORDER — ALBUTEROL SULFATE HFA 108 (90 BASE) MCG/ACT IN AERS: 2.0000 | INHALATION_SPRAY | Freq: Four times a day (QID) | RESPIRATORY_TRACT | Status: DC

## 2015-06-24 NOTE — Telephone Encounter (Signed)
Caller name: Casey Schmidt   Relationship to patient: Self   Can be reached: [phone] or [phone]  Pharmacy:  Reason for call: pt would like to go ahead with the referral for her pain. She would prefer not to go anywhere far from [facility]. Pt also dont want to be referred to [facility].  Pt says that this is urgent.

## 2015-06-24 NOTE — Progress Notes (Signed)
Pre visit review using our clinic review tool, if applicable. No additional management support is needed unless otherwise documented below in the visit note. 

## 2015-06-24 NOTE — Telephone Encounter (Signed)
Referral placed to Pain Clinic, instructed that Pt does not want to be referred to [facility].

## 2015-06-24 NOTE — Patient Instructions (Signed)
Please go to the lab and provide a urine sample for a UDS

## 2015-06-24 NOTE — Progress Notes (Signed)
Subjective:    Patient ID: Casey Schmidt, female    DOB: Dec 24, 1955, 59 y.o.   MRN: [id number]  DOS:  06/24/2015 Type of visit - description : To discuss pain management Interval history:  Patient reports that she has gone to a methadone clinic to get treatment for pain, they are actually prescribing methadone 55 mg daily. She was prescribed sertraline for anxiety but she is not taking it, "it didn't work". Reports that she continue with generalized pain at the fingers, back and knees.   Review of Systems   Past Medical History  Diagnosis Date  . Arthritis   . COPD (chronic obstructive pulmonary disease)   . GERD (gastroesophageal reflux disease)   . Multiple gastric ulcers     d/t BC powders  . Hypertension   . Migraines     h/o  . Anxiety     Past Surgical History  Procedure Laterality Date  . Right oophorectomy    . Wrist surgery Right 2015    fx  . Tubal ligation    . Ganglion cyst removal Left remotely     from wrist    History   Social History  . Marital Status: Widowed    Spouse Name: N/A  . Number of Children: 2  . Years of Education: N/A   Occupational History  . retired-disability d/t anxiety-djd    Social History Main Topics  . Smoking status: Current Every Day Smoker -- 0.25 packs/day for 32 years    Types: Cigarettes  . Smokeless tobacco: Never Used     Comment: smoking again as of 09-2014   . Alcohol Use: 0.0 oz/week    0 Standard drinks or equivalent per week     Comment: rare  . Drug Use: No  . Sexual Activity: Not on file   Other Topics Concern  . Not on file   Social History Narrative   Lost husband 2014        Medication List       This list is accurate as of: 06/24/15  9:58 AM.  Always use your most recent med list.               albuterol 108 (90 BASE) MCG/ACT inhaler  Commonly known as:  VENTOLIN HFA  Inhale 2 puffs into the lungs 4 (four) times daily.     ALPRAZolam 0.5 MG tablet  Commonly known as:  XANAX    Take 1 tablet (0.5 mg total) by mouth 4 (four) times daily as needed for anxiety.     famotidine 20 MG tablet  Commonly known as:  PEPCID  Take 20 mg by mouth at bedtime.     HYDROcodone-acetaminophen 5-325 MG per tablet  Commonly known as:  NORCO/VICODIN  Take 1 tablet by mouth every 8 (eight) hours as needed.     pantoprazole 40 MG tablet  Commonly known as:  PROTONIX  1 tablet daily.     ranitidine 300 MG tablet  Commonly known as:  ZANTAC  Take 300 mg by mouth at bedtime.     sertraline 100 MG tablet  Commonly known as:  ZOLOFT  Take 1 tablet (100 mg total) by mouth daily.     tiotropium 18 MCG inhalation capsule  Commonly known as:  SPIRIVA HANDIHALER  Place 1 capsule (18 mcg total) into inhaler and inhale daily.           Objective:   Physical Exam BP 102/68 mmHg  Pulse 78  Temp(Src)  97.6 F (36.4 C) (Oral)  Ht 5\' 3"  (1.6 m)  Wt 103 lb 8 oz (46.947 kg)  BMI 18.34 kg/m2  SpO2 92% General:   Well developed, well nourished . NAD.  HEENT:  Normocephalic . Face symmetric, atraumatic  Neurologic:  alert & oriented X3.  Speech normal, gait appropriate for age and unassisted Psych--  Cognition and judgment appear intact.  Cooperative with normal attention span and concentration.  Behavior appropriate. No anxious or depressed appearing.      Assessment & Plan:     Weight loss: Has gained several pounds since last visit   Today, I spent more than   28 min with the patient: >50% of the time counseling regards management of anxiety and chronic pain.

## 2015-06-25 ENCOUNTER — Ambulatory Visit (INDEPENDENT_AMBULATORY_CARE_PROVIDER_SITE_OTHER): Payer: Medicare Other | Admitting: Internal Medicine

## 2015-06-25 ENCOUNTER — Encounter: Payer: Self-pay | Admitting: Internal Medicine

## 2015-06-25 VITALS — BP 112/64 | HR 84 | Temp 98.2°F | Ht 64.0 in | Wt 103.5 lb

## 2015-06-25 DIAGNOSIS — J410 Simple chronic bronchitis: Secondary | ICD-10-CM

## 2015-06-25 MED ORDER — AMOXICILLIN 500 MG PO CAPS: 1000.0000 mg | ORAL_CAPSULE | Freq: Two times a day (BID) | ORAL | Status: DC

## 2015-06-25 MED ORDER — PREDNISONE 10 MG PO TABS: ORAL_TABLET | ORAL | Status: DC

## 2015-06-25 MED ORDER — FLUTICASONE PROPIONATE 50 MCG/ACT NA SUSP: 2.0000 | Freq: Every day | NASAL | Status: DC | PRN

## 2015-06-25 MED ORDER — RANITIDINE HCL 300 MG PO TABS: 300.0000 mg | ORAL_TABLET | Freq: Every day | ORAL | Status: DC

## 2015-06-25 NOTE — Assessment & Plan Note (Addendum)
Presents  today with a mild COPD exacerbation, no chronic symptoms. PFTs were ordered but never done (Cost) Plan: Prednisone, antibiotics if not better, use albuterol as needed.

## 2015-06-25 NOTE — Progress Notes (Signed)
Pre visit review using our clinic review tool, if applicable. No additional management support is needed unless otherwise documented below in the visit note. 

## 2015-06-25 NOTE — Progress Notes (Signed)
Subjective:    Patient ID: Casey Schmidt, female    DOB: 1956/07/11, 59 y.o.   MRN: [id number]  DOS:  06/25/2015   Type of visit - description : Acute Interval history: Here today to discuss the following problems: Symptoms started 3 days ago with increased cough, wheezing. Some hoarseness, mild headache and ear ache. She got OTC cough suppressant, very little help. Other than this exacerbation she has been pretty much asymptomatic   Review of Systems  no fever chills + Sinus pain congestion but no discharge No nausea or vomiting.  Past Medical History  Diagnosis Date  . Arthritis   . COPD (chronic obstructive pulmonary disease)   . GERD (gastroesophageal reflux disease)   . Multiple gastric ulcers     d/t BC powders  . Hypertension   . Migraines     h/o  . Anxiety     Past Surgical History  Procedure Laterality Date  . Right oophorectomy    . Wrist surgery Right 2015    fx  . Tubal ligation    . Ganglion cyst removal Left remotely     from wrist    History   Social History  . Marital Status: Widowed    Spouse Name: N/A  . Number of Children: 2  . Years of Education: N/A   Occupational History  . retired-disability d/t anxiety-djd    Social History Main Topics  . Smoking status: Current Every Day Smoker -- 0.25 packs/day for 32 years    Types: Cigarettes  . Smokeless tobacco: Never Used     Comment: smoking again as of 09-2014   . Alcohol Use: 0.0 oz/week    0 Standard drinks or equivalent per week     Comment: rare  . Drug Use: No  . Sexual Activity: Not on file   Other Topics Concern  . Not on file   Social History Narrative   Lost husband 2014        Medication List       This list is accurate as of: 06/25/15  5:10 PM.  Always use your most recent med list.               albuterol 108 (90 BASE) MCG/ACT inhaler  Commonly known as:  VENTOLIN HFA  Inhale 2 puffs into the lungs 4 (four) times daily.     ALPRAZolam 0.5 MG tablet    Commonly known as:  XANAX  Take 1 tablet (0.5 mg total) by mouth 4 (four) times daily as needed for anxiety.     amoxicillin 500 MG capsule  Commonly known as:  AMOXIL  Take 2 capsules (1,000 mg total) by mouth 2 (two) times daily.     famotidine 20 MG tablet  Commonly known as:  PEPCID  Take 20 mg by mouth at bedtime.     fluticasone 50 MCG/ACT nasal spray  Commonly known as:  FLONASE  Place 2 sprays into both nostrils daily as needed for allergies or rhinitis.     methadone 10 MG/5ML solution  Commonly known as:  DOLOPHINE  Take 55 mg by mouth daily.     pantoprazole 40 MG tablet  Commonly known as:  PROTONIX  1 tablet daily.     predniSONE 10 MG tablet  Commonly known as:  DELTASONE  3 tabs x 2 days, 2 tabs x 2 days, 1 tab x 2 days     ranitidine 300 MG tablet  Commonly known as:  ZANTAC  Take 1 tablet (300 mg total) by mouth at bedtime.           Objective:   Physical Exam BP 112/64 mmHg  Pulse 84  Temp(Src) 98.2 F (36.8 C) (Oral)  Ht 5\' 4"  (1.626 m)  Wt 103 lb 8 oz (46.947 kg)  BMI 17.76 kg/m2  SpO2 91% General:   Well developed, well nourished . NAD.  HEENT:  Normocephalic . Face symmetric, atraumatic TMs normal, throat is slightly red but no discharge, nose slightly congested, sinuses no TTP Lungs:  Few rhonchi and end expiratory wheezing Normal respiratory effort, no intercostal retractions, no accessory muscle use. Heart: RRR,  no murmur.  No pretibial edema bilaterally  Skin: Not pale. Not jaundice Neurologic:  alert & oriented X3.  Speech normal, gait appropriate for age and unassisted Psych--  Cognition and judgment appear intact.  Cooperative with normal attention span and concentration.  Behavior appropriate. No anxious or depressed appearing.      Assessment & Plan:

## 2015-06-25 NOTE — Patient Instructions (Signed)
Rest, fluids , tylenol  For cough: Take the medication. He got or Mucinex DM twice a day as needed until better  For nasal congestion Use OTC Nasocort or Flonase : 2 nasal sprays on each side of the nose daily until you feel better  Take prednisone for a few days  Take the antibiotic as prescribed  (Amoxicillin) only if you're not improving in the next 2 or 3 days  Call if not gradually better over the next  10 days  Call anytime if the symptoms are severe

## 2015-06-25 NOTE — Assessment & Plan Note (Signed)
Not taking sertraline, states that that didn't help in any way because she's not depressed. (I explained the patient that "antidepressants" are also excellent medications for anxiety) Reports that in the past she was prescribed Prozac, Wellbutrin, Paxil and many others and the only thing that helps is either  Xanax or Valium (taking xanax for years). See comments under pain management, again the patient is putting me in a position that the only thing i can do to help her is  to prescribe Xanax. That is a red flag. Plan: UDS, continue Xanax as needed mostly because she has been taking that long-term right like to avoid withdrawals.

## 2015-06-25 NOTE — Assessment & Plan Note (Signed)
The patient is currently taking methadone and not Vicodin, receiving medications from the methadone clinic. She does not like to go back to that clinic and request me to prescribe either methadone or oxycodone. Again the patient reports chronic pain, recently sed rate, rheumatoid factor and ANA were negative. 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 She has declined to see orthopedic doctor. For a number of reason she cannot take any other medications except methadone or oxycodone. She also has anxiety and the only thing she can take is Xanax. Essentially, the patient is telling me that the only thing that help her are 3  highly addictive medications, that is a red flag. Although I like to help her, I feel uncomfortable prescribing such medicines. Plan: Refer to pain management clinic. Until then needs to continue seeing be seen at the methadone clinic.

## 2015-06-28 ENCOUNTER — Telehealth: Payer: Self-pay | Admitting: Internal Medicine

## 2015-06-28 MED ORDER — DOXYCYCLINE HYCLATE 100 MG PO TABS: 100.0000 mg | ORAL_TABLET | Freq: Two times a day (BID) | ORAL | Status: DC

## 2015-06-28 NOTE — Telephone Encounter (Addendum)
Please see below. Per Pt if message received correctly has been taking amoxicillin which we originally prescribed to her last week and prednisone and having rectal bleeding and constipation. Has since stopped amoxicillin and rectal bleeding has stopped but still having constipation. Pt called this morning and original message received wrong, however, [facility] Should Pt be triaged for rectal bleeding? Please advise. Thank you.

## 2015-06-28 NOTE — Telephone Encounter (Signed)
Thx, agree w/ you, she needed to go to the ER or UC

## 2015-06-28 NOTE — Telephone Encounter (Signed)
Please advise 

## 2015-06-28 NOTE — Telephone Encounter (Signed)
Would like a call back to make sure that what happen to her is normal.

## 2015-06-28 NOTE — Telephone Encounter (Signed)
Pt calling back stating that the previous information is not correct, pt did indeed pick up the antibiotic however dr. Larose Kells requested that she wait 2 to 3 days before trying the antibiotic,pt states she could tell that the prednisone was helping a lot with her breathing however she still has the cough. States when she stop taking the antibiotic her bleeding in her rear end slowed down and she was able to produce a little BM but very painful. Pt just wanted to correct previous msg so that dr. Larose Kells does know that she did follow his directions.

## 2015-06-28 NOTE — Telephone Encounter (Signed)
12 prednisone tablets were enough. Discontinue amoxicillin Doxycycline 100 mg one by mouth twice a day #14 no refills

## 2015-06-28 NOTE — Telephone Encounter (Signed)
FYI- Patient states she took one dose of amoxicillin and had blood in her stool - once that was bright red and she reports a large amount.  She had one more episode that was a smaller amount.  She reports that she was dizzy after the second episode.  She stopped taking her amoxicillin and will add in MyChart as an intolerance/allergy.  However, patient states she thinks she has a fissure and reports being dizzy when the episode occurred.  Notified patient that she should go to [facility] clinic or ED for blood in stool, but she refused.  Next available appointment with Dr. Larose Kells at [facility] 06/29/15.  Patient scheduled, but notified to go to ED if occurs any more.  Patient stated understanding but declines ED.

## 2015-06-28 NOTE — Telephone Encounter (Signed)
Amoxicillin d/c, doxycycline 100 mg 1 tablet bid #14, and 0 refills [facility]

## 2015-06-28 NOTE — Telephone Encounter (Signed)
Caller name: Lashunta Frieden Relationship to patient: self Can be reached: [phone] [facility]  Reason for call:  Pt called stating she did not get the antibiotic 06/25/15 bc she cannot take any "cillins". She said she usually gets doxycyline or zpack. She also states that the quantity of prednisone ordered was not enough. Based on regimen prescribed should have been #15 but only sent in #12. Pt said in the past she is on a longer course of prednisone (normally starting with 4/day). Pt ph# above.

## 2015-06-29 ENCOUNTER — Encounter: Payer: Self-pay | Admitting: Internal Medicine

## 2015-06-29 ENCOUNTER — Ambulatory Visit (INDEPENDENT_AMBULATORY_CARE_PROVIDER_SITE_OTHER): Payer: Medicare Other | Admitting: Internal Medicine

## 2015-06-29 VITALS — BP 124/68 | HR 87 | Temp 98.2°F | Ht 64.0 in | Wt 103.0 lb

## 2015-06-29 DIAGNOSIS — K921 Melena: Secondary | ICD-10-CM

## 2015-06-29 DIAGNOSIS — K625 Hemorrhage of anus and rectum: Secondary | ICD-10-CM | POA: Diagnosis not present

## 2015-06-29 LAB — CBC WITH DIFFERENTIAL/PLATELET
Basophils Absolute: 0.1 10*3/uL (ref 0.0–0.1)
Basophils Relative: 0.5 % (ref 0.0–3.0)
Eosinophils Absolute: 0.2 10*3/uL (ref 0.0–0.7)
Eosinophils Relative: 1.2 % (ref 0.0–5.0)
HEMATOCRIT: 40 % (ref 36.0–46.0)
HEMOGLOBIN: 13.6 g/dL (ref 12.0–15.0)
Lymphocytes Relative: 22.4 % (ref 12.0–46.0)
Lymphs Abs: 3 10*3/uL (ref 0.7–4.0)
MCHC: 33.9 g/dL (ref 30.0–36.0)
MCV: 92.1 fl (ref 78.0–100.0)
MONO ABS: 0.6 10*3/uL (ref 0.1–1.0)
Monocytes Relative: 4.2 % (ref 3.0–12.0)
Neutro Abs: 9.7 10*3/uL — ABNORMAL HIGH (ref 1.4–7.7)
Neutrophils Relative %: 71.7 % (ref 43.0–77.0)
Platelets: 294 10*3/uL (ref 150.0–400.0)
RBC: 4.34 Mil/uL (ref 3.87–5.11)
RDW: 13.1 % (ref 11.5–15.5)
WBC: 13.5 10*3/uL — ABNORMAL HIGH (ref 4.0–10.5)

## 2015-06-29 LAB — HEMOCCULT GUIAC POC 1CARD (OFFICE): FECAL OCCULT BLD: POSITIVE — AB

## 2015-06-29 LAB — BASIC METABOLIC PANEL
BUN: 11 mg/dL (ref 6–23)
CHLORIDE: 101 meq/L (ref 96–112)
CO2: 27 mEq/L (ref 19–32)
Calcium: 8.9 mg/dL (ref 8.4–10.5)
Creatinine, Ser: 1.02 mg/dL (ref 0.40–1.20)
GFR: 59.24 mL/min — ABNORMAL LOW (ref >60.00)
GLUCOSE: 118 mg/dL — AB (ref 70–99)
POTASSIUM: 3.8 meq/L (ref 3.5–5.1)
Sodium: 137 mEq/L (ref 135–145)

## 2015-06-29 NOTE — Patient Instructions (Signed)
Get your blood work before you leave    Go to the ER if you have increased rectal bleeding or the problem persist for the  next 2 or 3 days  Also call anytime if you feel dizzy, weak.

## 2015-06-29 NOTE — Progress Notes (Signed)
Pre visit review using our clinic review tool, if applicable. No additional management support is needed unless otherwise documented below in the visit note. 

## 2015-06-29 NOTE — Progress Notes (Addendum)
Subjective:    Patient ID: Casey Schmidt, female    DOB: Feb 22, 1956, 59 y.o.   MRN: [id number]  DOS:  06/29/2015 Type of visit - description : Acute Interval history: The patient started taking amoxicillin for bronchitis 06/26/2015, took a couple of doses, that day she saw blood per rectum: Described as bright red, "a lot" like a cupfull in the toilet water. Symptoms associated with some peri- rectal pressure and pain. No anal  itching or burning. Has seen more fresh blood per rectum over the last 4 days but is gradually getting better. Today she still saw some blood in the commode water. There rectal pressure has decreased. Reports this is not the first time she sees blood per rectum but never seek medical attention   Review of Systems No fever chills No nausea, vomiting or abdominal pain Not taking any Motrin or similar medications Respiratory symptoms have decreased, see previous notes. No dizziness  Past Medical History  Diagnosis Date  . Arthritis   . COPD (chronic obstructive pulmonary disease)   . GERD (gastroesophageal reflux disease)   . Multiple gastric ulcers in the 70s    d/t BC powders  . Hypertension   . Migraines     h/o  . Anxiety     Past Surgical History  Procedure Laterality Date  . Right oophorectomy    . Wrist surgery Right 2015    fx  . Tubal ligation    . Ganglion cyst removal Left remotely     from wrist    History   Social History  . Marital Status: Widowed    Spouse Name: N/A  . Number of Children: 2  . Years of Education: N/A   Occupational History  . retired-disability d/t anxiety-djd    Social History Main Topics  . Smoking status: Current Every Day Smoker -- 0.25 packs/day for 32 years    Types: Cigarettes  . Smokeless tobacco: Never Used     Comment: smoking again as of 09-2014   . Alcohol Use: 0.0 oz/week    0 Standard drinks or equivalent per week     Comment: rare  . Drug Use: No  . Sexual Activity: Not on file    Other Topics Concern  . Not on file   Social History Narrative   Lost husband 2014        Medication List       This list is accurate as of: 06/29/15  6:11 PM.  Always use your most recent med list.               albuterol 108 (90 BASE) MCG/ACT inhaler  Commonly known as:  VENTOLIN HFA  Inhale 2 puffs into the lungs 4 (four) times daily.     ALPRAZolam 0.5 MG tablet  Commonly known as:  XANAX  Take 1 tablet (0.5 mg total) by mouth 4 (four) times daily as needed for anxiety.     doxycycline 100 MG tablet  Commonly known as:  VIBRA-TABS  Take 1 tablet (100 mg total) by mouth 2 (two) times daily.     famotidine 20 MG tablet  Commonly known as:  PEPCID  Take 20 mg by mouth at bedtime.     fluticasone 50 MCG/ACT nasal spray  Commonly known as:  FLONASE  Place 2 sprays into both nostrils daily as needed for allergies or rhinitis.     methadone 10 MG/5ML solution  Commonly known as:  DOLOPHINE  Take 55 mg by  mouth daily.     predniSONE 10 MG tablet  Commonly known as:  DELTASONE  3 tabs x 2 days, 2 tabs x 2 days, 1 tab x 2 days     ranitidine 300 MG tablet  Commonly known as:  ZANTAC  Take 1 tablet (300 mg total) by mouth at bedtime.           Objective:   Physical Exam BP 124/68 mmHg  Pulse 87  Temp(Src) 98.2 F (36.8 C) (Oral)  Ht 5\' 4"  (1.626 m)  Wt 103 lb (46.72 kg)  BMI 17.67 kg/m2  SpO2 93% General:   Well developed, well nourished . NAD.  HEENT:  Normocephalic . Face symmetric, atraumatic Lungs:  Decreased breath sounds otherwise clear Normal respiratory effort, no intercostal retractions, no accessory muscle use. Heart: RRR,  no murmur.  no pretibial edema bilaterally  Abdomen:  Not distended, soft, non-tender. No rebound or rigidity. No mass,organomegaly. DRE: No mass, brown mucus found, Hemoccult-positive. Skin: Not pale. Not jaundice Neurologic:  alert & oriented X3.  Speech normal, gait appropriate for age and unassisted Psych--   Cognition and judgment appear intact.  Cooperative with normal attention span and concentration.  Behavior appropriate. No anxious or depressed appearing. Procedure note: Anoscopy: Anoscope was introduced, no major discomfort. No   obvious abnormality noted during the exam    Assessment & Plan:

## 2015-06-29 NOTE — Assessment & Plan Note (Signed)
Acute rectal bleeding that started 4 days ago, seems to be self resolving, vital signs stable. No history of previous colonoscopies. Started hours after she took amoxicillin, doubt infection colitis although that is in the differential. Also in the differential ischemic colitis. Plan: GI referral CBC BMP Definitely ER if symptoms resurface. Addendum: Patient reluctant to see GI or have a procedure due to cost, explained her the importance to proceed with further evaluation.

## 2015-06-30 ENCOUNTER — Telehealth: Payer: Self-pay | Admitting: *Deleted

## 2015-06-30 DIAGNOSIS — H524 Presbyopia: Secondary | ICD-10-CM | POA: Diagnosis not present

## 2015-06-30 DIAGNOSIS — H2513 Age-related nuclear cataract, bilateral: Secondary | ICD-10-CM | POA: Diagnosis not present

## 2015-06-30 NOTE — Telephone Encounter (Signed)
Received coordination of care report via fax from [facility]. Forwarded to Dr. Larose Kells. JG//CMA

## 2015-07-02 ENCOUNTER — Encounter: Payer: Self-pay | Admitting: Gastroenterology

## 2015-07-05 ENCOUNTER — Telehealth: Payer: Self-pay

## 2015-07-05 NOTE — Telephone Encounter (Signed)
UDS: 06/24/2015  Positive for Alprazolam Positive for Methadone Positive for Amitriptyline (?)  Moderate risk per Dr. Larose Kells 07/05/2015

## 2015-07-07 NOTE — Telephone Encounter (Signed)
Faxed successfully. Sent for scanning. JG//CMA 

## 2015-07-08 ENCOUNTER — Telehealth: Payer: Self-pay | Admitting: *Deleted

## 2015-07-08 NOTE — Telephone Encounter (Signed)
Unable to reach patient at time of Pre-Visit Call.  Phone hung up and did not connect to answering machine.

## 2015-07-09 ENCOUNTER — Ambulatory Visit (INDEPENDENT_AMBULATORY_CARE_PROVIDER_SITE_OTHER): Payer: Medicare Other | Admitting: Internal Medicine

## 2015-07-09 ENCOUNTER — Encounter: Payer: Self-pay | Admitting: Internal Medicine

## 2015-07-09 VITALS — BP 120/78 | HR 87 | Temp 98.1°F | Resp 18 | Ht 64.0 in | Wt 102.0 lb

## 2015-07-09 DIAGNOSIS — Z23 Encounter for immunization: Secondary | ICD-10-CM | POA: Diagnosis not present

## 2015-07-09 DIAGNOSIS — Z0000 Encounter for general adult medical examination without abnormal findings: Secondary | ICD-10-CM

## 2015-07-09 DIAGNOSIS — Z1159 Encounter for screening for other viral diseases: Secondary | ICD-10-CM

## 2015-07-09 DIAGNOSIS — Z114 Encounter for screening for human immunodeficiency virus [HIV]: Secondary | ICD-10-CM

## 2015-07-09 LAB — LIPID PANEL
CHOLESTEROL: 188 mg/dL (ref 125–200)
HDL: 82 mg/dL (ref >=46)
LDL Cholesterol: 69 mg/dL (ref <130)
Total CHOL/HDL Ratio: 2.3 Ratio (ref <=5.0)
Triglycerides: 186 mg/dL — ABNORMAL HIGH (ref <150)
VLDL: 37 mg/dL — ABNORMAL HIGH (ref <30)

## 2015-07-09 MED ORDER — NICOTINE 10 MG IN INHA: 1.0000 | RESPIRATORY_TRACT | Status: DC | PRN

## 2015-07-09 NOTE — Patient Instructions (Signed)
Get your blood work before you leave    

## 2015-07-09 NOTE — Assessment & Plan Note (Addendum)
Immunization:  Tdap 06-2015, pneumonia shot 2015, Prevnar 2016. Will discuss Zostavax next year Recommend a flu shot yearly Colon cancer screening: Already referred to GI Female care: No recent Pap smear, declined a referral. States she will let me know when ready Had a mammogram 08-2014, 3-D mammogram recommended, it was done per patient, no records. Was told was normal Recommend a bone density test: Patient declined, will call when ready Labs: FLP, vitamin D , hep C screen, HIV EKG: No acute changes, sinus rhythm, question of left atrial enlargement. Counseling: Again counseled against tobacco abuse. Nicotrol rx printed   She is underweight, a healthy but abundant diet recommended.

## 2015-07-09 NOTE — Progress Notes (Signed)
Pre visit review using our clinic review tool, if applicable. No additional management support is needed unless otherwise documented below in the visit note. 

## 2015-07-09 NOTE — Progress Notes (Signed)
Subjective:    Patient ID: Casey Schmidt, female    DOB: November 26, 1955, 59 y.o.   MRN: [id number]  DOS:  07/09/2015 Type of visit - description : Complete physical exam Interval history: No new concerns    Review of Systems Constitutional: No fever. No chills. No unexplained wt changes. No unusual sweats  HEENT: No dental problems, no ear discharge, no facial swelling, no voice changes. No eye discharge, no eye  redness , no  intolerance to light   Respiratory: No wheezing , no  difficulty breathing. No cough , no mucus production  Cardiovascular: No CP, no leg swelling , no  Palpitations  GI: Had some nausea when she took antibiotics otherwise no vomiting, no diarrhea , no  abdominal pain.  No blood in the stools. No dysphagia, no odynophagia    Endocrine: No polyphagia, no polyuria , no polydipsia  GU: No dysuria, gross hematuria, difficulty urinating. No urinary urgency, no frequency.  Musculoskeletal: No joint swellings or unusual aches or pains (pain at baseline, see previous notes)  Skin: No change in the color of the skin, palor , no  Rash  Allergic, immunologic: No environmental allergies , no  food allergies  Neurological: No dizziness no  syncope. No headaches. No diplopia, no slurred, no slurred speech, no motor deficits, no facial  Numbness  Hematological: No enlarged lymph nodes, no easy bruising , no unusual bleedings  Psychiatry: No suicidal ideas, no hallucinations, no beavior problems, no confusion.  No unusual/severe anxiety, no depression   Past Medical History  Diagnosis Date  . Arthritis   . COPD (chronic obstructive pulmonary disease)   . GERD (gastroesophageal reflux disease)   . Multiple gastric ulcers in the 70s    d/t BC powders  . Hypertension   . Migraines     h/o  . Anxiety     Past Surgical History  Procedure Laterality Date  . Right oophorectomy    . Wrist surgery Right 2015    fx  . Tubal ligation    . Ganglion cyst  removal Left remotely     from wrist    Social History   Social History  . Marital Status: Widowed    Spouse Name: N/A  . Number of Children: 2  . Years of Education: N/A   Occupational History  . retired-disability d/t anxiety-djd    Social History Main Topics  . Smoking status: Current Every Day Smoker -- 0.25 packs/day for 32 years    Types: Cigarettes  . Smokeless tobacco: Never Used     Comment: smoking again as of 09-2014   . Alcohol Use: 0.0 oz/week    0 Standard drinks or equivalent per week     Comment: rare  . Drug Use: No  . Sexual Activity: Not on file   Other Topics Concern  . Not on file   Social History Narrative   Lost husband 2014, son lives w/ her, has a boyfriend     Family History  Problem Relation Age of Onset  . Hypertension Mother   . Hypertension Father   . Colon cancer Other     GM  . Emphysema Mother   . Breast cancer Neg Hx   . Skin cancer Father   . CAD Neg Hx   . Stroke Father        Medication List       This list is accurate as of: 07/09/15 11:59 PM.  Always use your most recent med  list.               albuterol 108 (90 BASE) MCG/ACT inhaler  Commonly known as:  VENTOLIN HFA  Inhale 2 puffs into the lungs 4 (four) times daily.     ALPRAZolam 0.5 MG tablet  Commonly known as:  XANAX  Take 1 tablet (0.5 mg total) by mouth 4 (four) times daily as needed for anxiety.     famotidine 20 MG tablet  Commonly known as:  PEPCID  Take 20 mg by mouth at bedtime.     fluticasone 50 MCG/ACT nasal spray  Commonly known as:  FLONASE  Place 2 sprays into both nostrils daily as needed for allergies or rhinitis.     methadone 10 MG/5ML solution  Commonly known as:  DOLOPHINE  Take 55 mg by mouth daily.     nicotine 10 MG inhaler  Commonly known as:  NICOTROL  Inhale 1 cartridge (1 continuous puffing total) into the lungs as needed for smoking cessation.     ranitidine 300 MG tablet  Commonly known as:  ZANTAC  Take 1 tablet  (300 mg total) by mouth at bedtime.           Objective:   Physical Exam BP 120/78 mmHg  Pulse 87  Temp(Src) 98.1 F (36.7 C) (Oral)  Resp 18  Ht 5\' 4"  (1.626 m)  Wt 102 lb (46.267 kg)  BMI 17.50 kg/m2  SpO2 94% General:   Well developed, well nourished . NAD.  Neck:   No  thyromegaly , normal carotid pulse HEENT:  Normocephalic . Face symmetric, atraumatic Lungs:  Decreased breath sounds otherwise clear Normal respiratory effort, no intercostal retractions, no accessory muscle use. Heart: RRR,  no murmur.  No pretibial edema bilaterally  Abdomen:  Not distended, soft, non-tender. No rebound or rigidity. No bruit Skin: Exposed areas without rash. Not pale. Not jaundice Neurologic:  alert & oriented X3.  Speech normal, gait appropriate for age and unassisted Strength symmetric and appropriate for age.  Psych: Cognition and judgment appear intact.  Cooperative with normal attention span and concentration.  Behavior appropriate. No anxious or depressed appearing.    Assessment & Plan:

## 2015-07-10 LAB — HEPATITIS C ANTIBODY: HCV Ab: NEGATIVE

## 2015-07-10 LAB — HIV ANTIBODY (ROUTINE TESTING W REFLEX): HIV 1&2 Ab, 4th Generation: NONREACTIVE

## 2015-07-13 LAB — VITAMIN D 1,25 DIHYDROXY
VITAMIN D 1, 25 (OH) TOTAL: 71 pg/mL (ref 18–72)
Vitamin D2 1, 25 (OH)2: <8 pg/mL
Vitamin D3 1, 25 (OH)2: 71 pg/mL

## 2015-07-16 ENCOUNTER — Telehealth: Payer: Self-pay

## 2015-07-16 NOTE — Telephone Encounter (Signed)
UDS: 06/24/2015  Positive for Alprazolam Positive for Methadone Positive for Amitriptyline---??  Low risk per Dr. Larose Kells 07/15/2015

## 2015-07-19 ENCOUNTER — Telehealth: Payer: Self-pay | Admitting: Internal Medicine

## 2015-07-19 NOTE — Telephone Encounter (Signed)
Please advise 

## 2015-07-19 NOTE — Telephone Encounter (Signed)
Spoke with Pt, informed her of recommendations. Pt stated Mucinex DM and Albuterol is not helping. Informed her that she will need to be seen again if not helping. Pt states she will not be able to be seen tomorrow because she is going to see the GI doctor. Wants to know if she can at least get another round of Abx. Informed her that I would speak with Dr. Larose Kells but could not guarantee that he will prescribe any Abx or Prednisone without being seen. Pt verbalized understanding.

## 2015-07-19 NOTE — Telephone Encounter (Signed)
Recommend Mucinex DM, albuterol 4 times a day, office visit if not better

## 2015-07-19 NOTE — Telephone Encounter (Signed)
Relation to pt: self  Call back number: [phone] [id number] [place], [place] [phone] (Phone) [phone] (Fax)         Reason for call:  Patient coughing and wheezing requesting antibiotics and prednisone. Patient states PCP is aware of reoccurring  symptoms

## 2015-07-20 ENCOUNTER — Telehealth: Payer: Self-pay | Admitting: Internal Medicine

## 2015-07-20 ENCOUNTER — Ambulatory Visit (INDEPENDENT_AMBULATORY_CARE_PROVIDER_SITE_OTHER): Payer: Medicare Other | Admitting: Gastroenterology

## 2015-07-20 ENCOUNTER — Encounter: Payer: Self-pay | Admitting: Gastroenterology

## 2015-07-20 ENCOUNTER — Encounter: Payer: Self-pay | Admitting: Internal Medicine

## 2015-07-20 VITALS — BP 110/70 | HR 84 | Ht 63.5 in | Wt 98.0 lb

## 2015-07-20 DIAGNOSIS — K921 Melena: Secondary | ICD-10-CM | POA: Diagnosis not present

## 2015-07-20 DIAGNOSIS — K6289 Other specified diseases of anus and rectum: Secondary | ICD-10-CM | POA: Diagnosis not present

## 2015-07-20 DIAGNOSIS — R195 Other fecal abnormalities: Secondary | ICD-10-CM | POA: Diagnosis not present

## 2015-07-20 MED ORDER — PREDNISONE 20 MG PO TABS: 20.0000 mg | ORAL_TABLET | Freq: Every day | ORAL | Status: DC

## 2015-07-20 MED ORDER — DILTIAZEM GEL 2 %: 1.0000 "application " | Freq: Three times a day (TID) | CUTANEOUS | Status: DC

## 2015-07-20 MED ORDER — ALBUTEROL SULFATE HFA 108 (90 BASE) MCG/ACT IN AERS: 2.0000 | INHALATION_SPRAY | Freq: Four times a day (QID) | RESPIRATORY_TRACT | Status: DC | PRN

## 2015-07-20 NOTE — Telephone Encounter (Signed)
Relation to HK:FEXM Call back number: [phone]   Reason for call:  Patient states insurance will not cover inhaler and insurance advised they will cover pro air inhaler.

## 2015-07-20 NOTE — Telephone Encounter (Signed)
Pt called back stating previous call dropped. She said that insurance would cover Ventolin or ProAir. I advised pt Ventolin is what was sent. She said she doesn't know the rules and that we need to call the pharmacy to straighten it out and get the inhaler her insurance will pay for. Pt requesting a call when the RX is sent/called in to the pharmacy as she has other meds that need picked up too.

## 2015-07-20 NOTE — Telephone Encounter (Signed)
Ok to switch, same sig

## 2015-07-20 NOTE — Telephone Encounter (Signed)
Prednisone 20 mg 1 tablet by mouth daily for 5 days [facility].

## 2015-07-20 NOTE — Telephone Encounter (Signed)
Informed Pt that [facility]air has been [facility].

## 2015-07-20 NOTE — Telephone Encounter (Signed)
[facility]. Will inform Pt via MyChart that Proair has been sent and may need PA from insurance.

## 2015-07-20 NOTE — Telephone Encounter (Signed)
[facility]. Pt's insurance denied Ventolin but will cover Proair inhaler. Please advise.

## 2015-07-20 NOTE — Progress Notes (Signed)
    History of Present Illness: This is a 59 year old female referred by Colon Branch, MD for the evaluation of hematochezia, heme positive stool and rectal pain for several months. She has a history of gastric ulcers in the 1970s related to goody powders and takes ranitidine on a daily basis. She states she has rectal pain and rectal bleeding with every bowel movement. She notes prolapsing tissue with bowel movements that reduces spontaneously. She was evaluated by Dr. Larose Kells 2 weeks ago with Hemoccult-positive stool and no lesions noted on DRE. Hb=13.1. She states she's lost about 10 pounds over the past 2 months. Denies abdominal pain, constipation, diarrhea, change in stool caliber, melena, nausea, vomiting, dysphagia, reflux symptoms, chest pain.  Review of Systems: Pertinent positive and negative review of systems were noted in the above HPI section. All other review of systems were otherwise negative.  Current Medications, Allergies, Past Medical History, Past Surgical History, Family History and Social History were reviewed in [employer] record.  Physical Exam: General: Well developed, well nourished, thin, no acute distress Head: Normocephalic and atraumatic Eyes:  sclerae anicteric, EOMI Ears: Normal auditory acuity Mouth: No deformity or lesions Neck: Supple, no masses or thyromegaly Lungs: Clear throughout to auscultation Heart: Regular rate and rhythm; no murmurs, rubs or bruits Abdomen: Soft, non tender and non distended. No masses, hepatosplenomegaly or hernias noted. Normal Bowel sounds Rectal: No external or internal lesions noted 1+ Hemoccult-positive brown stool in the vault, anal canal tenderness no fissures or hemorrhoids noted Musculoskeletal: Symmetrical with no gross deformities  Skin: No lesions on visible extremities Pulses:  Normal pulses noted Extremities: No clubbing, cyanosis, edema or deformities noted Neurological: Alert oriented x 4, grossly  nonfocal Cervical Nodes:  No significant cervical adenopathy Inguinal Nodes: No significant inguinal adenopathy Psychological:  Alert and cooperative. Normal mood and affect  Assessment and Recommendations:  1. Hematochezia, heme positive stool, rectal pain. Possible prolapsing internal hemorrhoids. Possible fissure although a fissure was not noted today. Begin sitz baths and standard rectal care instructions. Begin Anusol HC cream PR twice daily and diltiazem 2% gel PR tid. The risks (including bleeding, perforation, infection, missed lesions, medication reactions and possible hospitalization or surgery if complications occur), benefits, and alternatives to colonoscopy with possible biopsy and possible polypectomy were discussed with the patient and they consent to proceed.   2. History of gastric ulcers. Minimize or avoid ASA and NSAID usage. Continue ranitidine 300 mg po daily.   cc: Colon Branch, MD [address] [place],  [address]

## 2015-07-20 NOTE — Patient Instructions (Addendum)
You have been given a separate informational sheet regarding your tobacco use, the importance of quitting and local resources to help you quit.  We have sent the following medications to [facility] for you to pick up at your convenience:diltiazem gel for you to use rectally three times a day x 8 weeks.   You can use hydrocortisone rectal cream twice daily.   You have been scheduled for a colonoscopy. Please follow written instructions given to you at your visit today.  Please pick up your prep supplies at the pharmacy within the next 1-3 days. If you use inhalers (even only as needed), please bring them with you on the day of your procedure.  Thank you for choosing me and [facility].  Pricilla Riffle. Dagoberto Ligas., MD., Marval Regal .

## 2015-07-20 NOTE — Telephone Encounter (Signed)
Prednisone 20 mg one tablet daily 5 days #5 no refills

## 2015-07-20 NOTE — Telephone Encounter (Signed)
Ventolin probably needing prior authorization. Will call [facility] for clarification if Ventolin is denied by insurance or if PA is needed.

## 2015-07-22 DIAGNOSIS — D126 Benign neoplasm of colon, unspecified: Secondary | ICD-10-CM

## 2015-07-22 HISTORY — DX: Benign neoplasm of colon, unspecified: D12.6

## 2015-07-27 ENCOUNTER — Encounter: Payer: Self-pay | Admitting: Gastroenterology

## 2015-07-27 ENCOUNTER — Ambulatory Visit (AMBULATORY_SURGERY_CENTER): Payer: Medicare Other | Admitting: Gastroenterology

## 2015-07-27 VITALS — BP 147/89 | HR 79 | Temp 95.8°F | Resp 35 | Ht 63.5 in | Wt 98.0 lb

## 2015-07-27 DIAGNOSIS — D123 Benign neoplasm of transverse colon: Secondary | ICD-10-CM | POA: Diagnosis not present

## 2015-07-27 DIAGNOSIS — R195 Other fecal abnormalities: Secondary | ICD-10-CM | POA: Diagnosis not present

## 2015-07-27 DIAGNOSIS — D128 Benign neoplasm of rectum: Secondary | ICD-10-CM | POA: Diagnosis not present

## 2015-07-27 DIAGNOSIS — K6289 Other specified diseases of anus and rectum: Secondary | ICD-10-CM

## 2015-07-27 DIAGNOSIS — D125 Benign neoplasm of sigmoid colon: Secondary | ICD-10-CM

## 2015-07-27 DIAGNOSIS — K921 Melena: Secondary | ICD-10-CM

## 2015-07-27 MED ORDER — SODIUM CHLORIDE 0.9 % IV SOLN: 500.0000 mL | INTRAVENOUS | Status: DC

## 2015-07-27 NOTE — Progress Notes (Signed)
Report to PACU, RN, vss, BBS= Clear.  

## 2015-07-27 NOTE — Progress Notes (Signed)
Called to room to assist during endoscopic procedure.  Patient ID and intended procedure confirmed with present staff. Received instructions for my participation in the procedure from the performing physician.  

## 2015-07-27 NOTE — Patient Instructions (Signed)
YOU HAD AN ENDOSCOPIC PROCEDURE TODAY AT [facility]:   Refer to the procedure report that was given to you for any specific questions about what was found during the examination.  If the procedure report does not answer your questions, please call your gastroenterologist to clarify.  If you requested that your care partner not be given the details of your procedure findings, then the procedure report has been included in a sealed envelope for you to review at your convenience later.  YOU SHOULD EXPECT: Some feelings of bloating in the abdomen. Passage of more gas than usual.  Walking can help get rid of the air that was put into your GI tract during the procedure and reduce the bloating. If you had a lower endoscopy (such as a colonoscopy or flexible sigmoidoscopy) you may notice spotting of blood in your stool or on the toilet paper. If you underwent a bowel prep for your procedure, you may not have a normal bowel movement for a few days.  Please Note:  You might notice some irritation and congestion in your nose or some drainage.  This is from the oxygen used during your procedure.  There is no need for concern and it should clear up in a day or so.  SYMPTOMS TO REPORT IMMEDIATELY:   Following lower endoscopy (colonoscopy or flexible sigmoidoscopy):  Excessive amounts of blood in the stool  Significant tenderness or worsening of abdominal pains  Swelling of the abdomen that is new, acute  Fever of 100F or higher   For urgent or emergent issues, a gastroenterologist can be reached at any hour by calling [phone].   DIET: Your first meal following the procedure should be a small meal and then it is ok to progress to your normal diet. Heavy or fried foods are harder to digest and may make you feel nauseous or bloated.  Likewise, meals heavy in dairy and vegetables can increase bloating.  Drink plenty of fluids but you should avoid alcoholic beverages for 24  hours.  ACTIVITY:  You should plan to take it easy for the rest of today and you should NOT DRIVE or use heavy machinery until tomorrow (because of the sedation medicines used during the test).    FOLLOW UP: Our staff will call the number listed on your records the next business day following your procedure to check on you and address any questions or concerns that you may have regarding the information given to you following your procedure. If we do not reach you, we will leave a message.  However, if you are feeling well and you are not experiencing any problems, there is no need to return our call.  We will assume that you have returned to your regular daily activities without incident.  If any biopsies were taken you will be contacted by phone or by letter within the next 1-3 weeks.  Please call us at [phone] if you have not heard about the biopsies in 3 weeks.    SIGNATURES/CONFIDENTIALITY: You and/or your care partner have signed paperwork which will be entered into your electronic medical record.  These signatures attest to the fact that that the information above on your After Visit Summary has been reviewed and is understood.  Full responsibility of the confidentiality of this discharge information lies with you and/or your care-partner.  Hold Aspirin and all other NSAIDS for 3 weeks Await pathology report Repeat Colonoscopy in 6 months

## 2015-07-27 NOTE — Op Note (Signed)
[facility]  [address] [place], [id number]   COLONOSCOPY PROCEDURE REPORT  PATIENT: Casey Schmidt, Casey Schmidt  MR#: [id number] BIRTHDATE: Apr 06, 1956 , 47  yrs. old GENDER: female ENDOSCOPIST: Ladene Artist, MD, [facility] REFERRED CV:ELFY Larose Kells, M.D. PROCEDURE DATE:  07/27/2015 PROCEDURE:   Colonoscopy, diagnostic, Colonoscopy with snare polypectomy, and Submucosal injection, any substance First Screening Colonoscopy - Avg.  risk and is 50 yrs.  old or older - No.  Prior Negative Screening - Now for repeat screening. N/A  History of Adenoma - Now for follow-up colonoscopy & has been > or = to 3 yrs.  N/A  Polyps removed today? Yes ASA CLASS:   Class III INDICATIONS:Evaluation of unexplained GI bleeding, hematochezia, and heme-positive stool. MEDICATIONS: Monitored anesthesia care and Propofol 250 mg IV DESCRIPTION OF PROCEDURE:   After the risks benefits and alternatives of the procedure were thoroughly explained, informed consent was obtained.  The digital rectal exam revealed no abnormalities of the rectum.   The LB [id number]-H190 [id number]  endoscope was introduced through the anus and advanced to the cecum, which was identified by both the appendix and ileocecal valve. No adverse events experienced.   The quality of the prep was good.  (Suprep was used)  The instrument was then slowly withdrawn as the colon was fully examined. Estimated blood loss is zero unless otherwise noted in this procedure report.    COLON FINDINGS: A sessile polyp measuring 10 mm in size was found at the hepatic flexure.  A polypectomy was performed in a piecemeal fashion using snare cautery.  The resection was complete, the polyp tissue was completely retrieved and sent to histology.   Two sessile polyps measuring 5-6 mm in size were found in the sigmoid colon and transverse colon.  Polypectomies were performed with a cold snare.  The resection was complete, the polyp tissue was completely  retrieved and sent to histology.  A sessile polyp measuring 12 mm in size was found in the sigmoid colon.  A polypectomy was performed using snare cautery.  The resection was complete, the polyp tissue was completely retrieved and sent to histology.   A semi-pedunculated polyp measuring 30 mm in size was found in the rectum.  A polypectomy was performed in a piecemeal fashion using snare cautery.  The resection was complete, the polyp tissue was completely retrieved and sent to histology.  Injection (tattooing) was performed both proximal and distal to the polypectomy site.   The examination was otherwise normal. Retroflexed views revealed no abnormalities. The time to cecum = 5.4 Withdrawal time = 19.3   The scope was withdrawn and the procedure completed. COMPLICATIONS: There were no immediate complications.  ENDOSCOPIC IMPRESSION: 1.   Sessile polyp at the hepatic flexure; polypectomy performed in a piecemeal fashion using snare cautery 2.   Two sessile polyps in the sigmoid colon and transverse colon; polypectomies performed with a cold snare 3.   Sessile polyp in the sigmoid colon; polypectomy performed using snare cautery 4.  Semi-pedunculated polyp in the rectum; polypectomy performed in a piecemeal fashion using snare cautery; Injection (tattooing) performed  RECOMMENDATIONS: 1.  Hold Aspirin and all other NSAIDS for 3 weeks. 2.  Await pathology results 3.  Repeat Colonoscopy in 6 months, pending pathology review, to evaluate polypectomy sites  eSigned:  Ladene Artist, MD, [facility] 07/27/2015 10:41 AM     PATIENT NAME:  Casey Schmidt, Casey Schmidt MR#: [id number]

## 2015-07-28 ENCOUNTER — Telehealth: Payer: Self-pay | Admitting: *Deleted

## 2015-07-28 NOTE — Telephone Encounter (Signed)
  Follow up Call-  Call back number 07/27/2015  Post procedure Call Back phone  # [phone]  Permission to leave phone message Yes     Patient questions:  Do you have a fever, pain , or abdominal swelling? No. Pain Score  0 *  Have you tolerated food without any problems? Yes.    Have you been able to return to your normal activities? Yes.    Do you have any questions about your discharge instructions: Diet   No. Medications  No. Follow up visit  No.  Do you have questions or concerns about your Care? No.  Actions: * If pain score is 4 or above: No action needed, pain <4.

## 2015-08-03 ENCOUNTER — Ambulatory Visit: Payer: Medicare Other | Admitting: Gastroenterology

## 2015-08-03 ENCOUNTER — Other Ambulatory Visit: Payer: Self-pay | Admitting: Internal Medicine

## 2015-08-03 NOTE — Telephone Encounter (Signed)
Pt is requesting refill on Alprazolam.  Last OV: 07/09/2015 Last Fill: 06/07/2015 #60 1RF UDS: 06/24/2015 Low risk  Please advise.

## 2015-08-03 NOTE — Telephone Encounter (Signed)
Okay #60 and one refill 

## 2015-08-03 NOTE — Telephone Encounter (Signed)
Rx printed, awaiting MD signature.  

## 2015-08-03 NOTE — Telephone Encounter (Signed)
[facility]  

## 2015-08-04 ENCOUNTER — Encounter: Payer: Self-pay | Admitting: Gastroenterology

## 2015-08-04 DIAGNOSIS — H25011 Cortical age-related cataract, right eye: Secondary | ICD-10-CM | POA: Diagnosis not present

## 2015-08-04 DIAGNOSIS — H35033 Hypertensive retinopathy, bilateral: Secondary | ICD-10-CM | POA: Diagnosis not present

## 2015-08-04 DIAGNOSIS — H16409 Unspecified corneal neovascularization, unspecified eye: Secondary | ICD-10-CM | POA: Diagnosis not present

## 2015-08-04 DIAGNOSIS — H2511 Age-related nuclear cataract, right eye: Secondary | ICD-10-CM | POA: Diagnosis not present

## 2015-08-04 DIAGNOSIS — H25012 Cortical age-related cataract, left eye: Secondary | ICD-10-CM | POA: Diagnosis not present

## 2015-08-05 ENCOUNTER — Telehealth: Payer: Self-pay | Admitting: Internal Medicine

## 2015-08-05 ENCOUNTER — Ambulatory Visit (INDEPENDENT_AMBULATORY_CARE_PROVIDER_SITE_OTHER): Payer: Medicare Other | Admitting: Internal Medicine

## 2015-08-05 ENCOUNTER — Encounter: Payer: Self-pay | Admitting: Internal Medicine

## 2015-08-05 VITALS — BP 106/70 | HR 100 | Temp 98.0°F | Ht 63.5 in | Wt 101.2 lb

## 2015-08-05 DIAGNOSIS — R21 Rash and other nonspecific skin eruption: Secondary | ICD-10-CM

## 2015-08-05 DIAGNOSIS — Z09 Encounter for follow-up examination after completed treatment for conditions other than malignant neoplasm: Secondary | ICD-10-CM

## 2015-08-05 MED ORDER — HYDROCORTISONE 2.5 % EX CREA: TOPICAL_CREAM | Freq: Two times a day (BID) | CUTANEOUS | Status: DC

## 2015-08-05 MED ORDER — CLINDAMYCIN PHOSPHATE 1 % EX GEL: Freq: Two times a day (BID) | CUTANEOUS | Status: DC

## 2015-08-05 NOTE — Progress Notes (Signed)
Pre visit review using our clinic review tool, if applicable. No additional management support is needed unless otherwise documented below in the visit note. 

## 2015-08-05 NOTE — Patient Instructions (Signed)
Apply the  Creams twice a day, keep the area dry, call if not improving in the next 10 days

## 2015-08-05 NOTE — Telephone Encounter (Signed)
Ken with [facility] [address] Ph# [phone]  Please call with VO to authorize generic clindamycin 1% gel. RX sent does not allow for substitution and ins doesn't cover brand name.

## 2015-08-05 NOTE — Telephone Encounter (Signed)
Spoke with Yvone Neu, informed him okay to substitute to generic.

## 2015-08-05 NOTE — Progress Notes (Signed)
Subjective:    Patient ID: Casey Schmidt, female    DOB: 04/06/1956, 59 y.o.   MRN: [id number]  DOS:  08/05/2015 Type of visit - description : Acute Interval history: 3 weeks ago developed a bump at the left buttock,   used a hydrocortisone cream, she is not better has now 2 additional lesions. Denies fever or chills No discharge Denies any paresthesias on a dermatomal area. Had similar lesions on the back on and off   Review of Systems  See above  Past Medical History  Diagnosis Date  . Arthritis   . COPD (chronic obstructive pulmonary disease)   . GERD (gastroesophageal reflux disease)   . Multiple gastric ulcers in the 70s    d/t BC powders  . Hypertension   . Migraines     h/o  . Anxiety   . Anal fissure   . Pneumonia   . Allergy     SEASONAL  . Cataract     BILATERAL    Past Surgical History  Procedure Laterality Date  . Right oophorectomy    . Wrist surgery Right 2015    fx  . Tubal ligation    . Ganglion cyst removal Left remotely     from wrist    Social History   Social History  . Marital Status: Widowed    Spouse Name: N/A  . Number of Children: 2  . Years of Education: N/A   Occupational History  . retired-disability d/t anxiety-djd    Social History Main Topics  . Smoking status: Current Every Day Smoker -- 0.25 packs/day for 32 years    Types: Cigarettes  . Smokeless tobacco: Never Used     Comment: smoking again as of 09-2014   . Alcohol Use: 0.0 oz/week    0 Standard drinks or equivalent per week     Comment: rare  . Drug Use: No  . Sexual Activity: Not on file   Other Topics Concern  . Not on file   Social History Narrative   Lost husband 2014, son lives w/ her, has a boyfriend        Medication List       This list is accurate as of: 08/05/15 10:51 AM.  Always use your most recent med list.               albuterol 108 (90 BASE) MCG/ACT inhaler  Commonly known as:  PROVENTIL HFA;VENTOLIN HFA  Inhale 2 puffs  into the lungs every 6 (six) hours as needed for wheezing or shortness of breath.     ALPRAZolam 0.5 MG tablet  Commonly known as:  XANAX  Take 1 tablet (0.5 mg total) by mouth 4 (four) times daily as needed for anxiety.     diltiazem 2 % Gel  Apply 1 application topically 3 (three) times daily.     fluticasone 50 MCG/ACT nasal spray  Commonly known as:  FLONASE  Place 2 sprays into both nostrils daily as needed for allergies or rhinitis.     methadone 10 MG/5ML solution  Commonly known as:  DOLOPHINE  Take 55 mg by mouth daily.     predniSONE 20 MG tablet  Commonly known as:  DELTASONE  Take 1 tablet (20 mg total) by mouth daily with breakfast.     ranitidine 300 MG tablet  Commonly known as:  ZANTAC  Take 1 tablet (300 mg total) by mouth at bedtime.     VITAMIN D PO  Take 1  tablet by mouth daily.           Objective:   Physical Exam  Constitutional: She is oriented to person, place, and time. She appears well-developed and well-nourished.  Neurological: She is alert and oriented to person, place, and time.  Skin: Skin is warm and dry.     Psychiatric: She has a normal mood and affect. Her behavior is normal. Judgment and thought content normal.   BP 106/70 mmHg  Pulse 100  Temp(Src) 98 F (36.7 C) (Oral)  Ht 5' 3.5" (1.613 m)  Wt 101 lb 4 oz (45.927 kg)  BMI 17.65 kg/m2  SpO2 98%     Assessment & Plan:   A/P Rash: Folliculitis? Doubt shingles. Plan: Topical clindamycin and steroid's. Call if no better.

## 2015-08-06 DIAGNOSIS — Z09 Encounter for follow-up examination after completed treatment for conditions other than malignant neoplasm: Secondary | ICD-10-CM | POA: Insufficient documentation

## 2015-08-06 NOTE — Assessment & Plan Note (Signed)
Rash: Folliculitis? Doubt shingles. Plan: Topical clindamycin and steroid's. Call if no better.

## 2015-08-20 ENCOUNTER — Other Ambulatory Visit: Payer: Self-pay | Admitting: Family Medicine

## 2015-08-20 MED ORDER — TRIAMCINOLONE ACETONIDE 0.1 % EX CREA: 1.0000 "application " | TOPICAL_CREAM | Freq: Two times a day (BID) | CUTANEOUS | Status: DC

## 2015-08-20 MED ORDER — MUPIROCIN CALCIUM 2 % EX CREA: 1.0000 "application " | TOPICAL_CREAM | Freq: Two times a day (BID) | CUTANEOUS | Status: DC

## 2015-08-20 NOTE — Progress Notes (Signed)
Patient could not afford clinda gel + hydrocortizone.   Sub generic bactroban + TAC, which should rough equal original prescription.  Electronically Signed  By: Owens Loffler, MD On: 08/20/2015 5:34 PM

## 2015-09-16 ENCOUNTER — Telehealth: Payer: Self-pay | Admitting: Internal Medicine

## 2015-09-16 MED ORDER — BETAMETHASONE DIPROPIONATE AUG 0.05 % EX CREA: TOPICAL_CREAM | Freq: Two times a day (BID) | CUTANEOUS | Status: DC

## 2015-09-16 NOTE — Telephone Encounter (Signed)
Please advise 

## 2015-09-16 NOTE — Telephone Encounter (Signed)
Advise patient I just send a stronger cream. If she is not better she needs to be refer to dermatology.

## 2015-09-16 NOTE — Telephone Encounter (Signed)
Spoke with Pt, informed her that Dr. Larose Kells has [facility], informed her that if this medication doesn't help clear up the rash she will need to see dermatology. Pt verbalized understanding.

## 2015-09-16 NOTE — Telephone Encounter (Signed)
[facility] [address]  Pt asking if we can send refill on hydrocortisone 2.5% cream she got in Sept. She has a place on her bottom that is still not all the way better. The cream ordered 9/30 did not work. She doesn't know if there is a higher strength that can be sent to make it go away. Please contact at [phone] if needed.

## 2015-09-21 ENCOUNTER — Other Ambulatory Visit: Payer: Self-pay | Admitting: Internal Medicine

## 2015-09-22 NOTE — Telephone Encounter (Signed)
Okay #60 and one refill 

## 2015-09-22 NOTE — Telephone Encounter (Signed)
Pt is requesting refill on Alprazolam.  Last OV: 08/05/2015, no future appt scheduled Last Fill: 08/03/2015 #60 and 1 RF Pt can take 1 tablet qid PRN UDS: 06/24/2015 Low risk  Please advise.

## 2015-09-22 NOTE — Telephone Encounter (Signed)
[facility]  

## 2015-09-22 NOTE — Telephone Encounter (Signed)
Rx printed, awaiting MD signature.  

## 2015-09-28 DIAGNOSIS — H2512 Age-related nuclear cataract, left eye: Secondary | ICD-10-CM | POA: Diagnosis not present

## 2015-10-07 ENCOUNTER — Ambulatory Visit: Payer: Medicare Other

## 2015-10-12 ENCOUNTER — Telehealth: Payer: Self-pay

## 2015-10-12 NOTE — Telephone Encounter (Signed)
Noted. Thanks.

## 2015-10-12 NOTE — Telephone Encounter (Signed)
Called patient to confirm  [facility]icare [facility] appointment. States she will call and reschedule,cannot come tomorrow.

## 2015-10-13 ENCOUNTER — Ambulatory Visit: Payer: Medicare Other

## 2015-11-01 ENCOUNTER — Other Ambulatory Visit: Payer: Self-pay | Admitting: Internal Medicine

## 2015-11-01 NOTE — Telephone Encounter (Signed)
Pt can take 1 tablet every 4 hours as needed.

## 2015-11-01 NOTE — Telephone Encounter (Signed)
Too early, next refill should be 11/22/2015

## 2015-11-01 NOTE — Telephone Encounter (Signed)
Pt is requesting refill on Alprazolam.  Last OV: 08/05/2015 Last Fill: 09/22/2015 #60 and 1RF UDS: 06/24/2015 Low risk  Please advise.

## 2015-11-02 NOTE — Telephone Encounter (Signed)
Rx printed, awaiting MD signature.  

## 2015-11-02 NOTE — Telephone Encounter (Signed)
[facility]  

## 2015-11-02 NOTE — Telephone Encounter (Signed)
Okay, call #[phone], no refills

## 2015-11-10 ENCOUNTER — Ambulatory Visit: Payer: Medicare Other | Admitting: Internal Medicine

## 2015-11-10 ENCOUNTER — Telehealth: Payer: Self-pay | Admitting: Internal Medicine

## 2015-11-10 ENCOUNTER — Ambulatory Visit (HOSPITAL_BASED_OUTPATIENT_CLINIC_OR_DEPARTMENT_OTHER)
Admission: RE | Admit: 2015-11-10 | Discharge: 2015-11-10 | Disposition: A | Discharge status: Home / Self Care | Payer: Medicare Other | Source: Ambulatory Visit | Attending: Internal Medicine | Admitting: Internal Medicine

## 2015-11-10 ENCOUNTER — Ambulatory Visit (INDEPENDENT_AMBULATORY_CARE_PROVIDER_SITE_OTHER): Payer: Medicare Other | Admitting: Internal Medicine

## 2015-11-10 ENCOUNTER — Encounter: Payer: Self-pay | Admitting: Internal Medicine

## 2015-11-10 VITALS — BP 132/76 | HR 80 | Temp 98.0°F | Ht 63.5 in | Wt 90.0 lb

## 2015-11-10 DIAGNOSIS — J441 Chronic obstructive pulmonary disease with (acute) exacerbation: Secondary | ICD-10-CM | POA: Diagnosis not present

## 2015-11-10 DIAGNOSIS — R05 Cough: Secondary | ICD-10-CM | POA: Insufficient documentation

## 2015-11-10 DIAGNOSIS — R634 Abnormal weight loss: Secondary | ICD-10-CM | POA: Diagnosis not present

## 2015-11-10 DIAGNOSIS — R636 Underweight: Secondary | ICD-10-CM

## 2015-11-10 DIAGNOSIS — L989 Disorder of the skin and subcutaneous tissue, unspecified: Secondary | ICD-10-CM

## 2015-11-10 DIAGNOSIS — Z09 Encounter for follow-up examination after completed treatment for conditions other than malignant neoplasm: Secondary | ICD-10-CM

## 2015-11-10 DIAGNOSIS — J449 Chronic obstructive pulmonary disease, unspecified: Secondary | ICD-10-CM | POA: Insufficient documentation

## 2015-11-10 DIAGNOSIS — R059 Cough, unspecified: Secondary | ICD-10-CM

## 2015-11-10 DIAGNOSIS — F17200 Nicotine dependence, unspecified, uncomplicated: Secondary | ICD-10-CM | POA: Insufficient documentation

## 2015-11-10 LAB — CBC WITH DIFFERENTIAL/PLATELET
BASOS ABS: 0 10*3/uL (ref 0.0–0.1)
BASOS PCT: 0.1 % (ref 0.0–3.0)
EOS ABS: 0 10*3/uL (ref 0.0–0.7)
Eosinophils Relative: 0 % (ref 0.0–5.0)
HEMATOCRIT: 45 % (ref 36.0–46.0)
Hemoglobin: 14.8 g/dL (ref 12.0–15.0)
LYMPHS ABS: 1.2 10*3/uL (ref 0.7–4.0)
LYMPHS PCT: 9 % — AB (ref 12.0–46.0)
MCHC: 32.9 g/dL (ref 30.0–36.0)
MCV: 93 fl (ref 78.0–100.0)
Monocytes Absolute: 0.2 10*3/uL (ref 0.1–1.0)
Monocytes Relative: 1.5 % — ABNORMAL LOW (ref 3.0–12.0)
NEUTROS ABS: 12.4 10*3/uL — AB (ref 1.4–7.7)
PLATELETS: 261 10*3/uL (ref 150.0–400.0)
RBC: 4.84 Mil/uL (ref 3.87–5.11)
RDW: 14.6 % (ref 11.5–15.5)
WBC: 13.9 10*3/uL — ABNORMAL HIGH (ref 4.0–10.5)

## 2015-11-10 LAB — PREALBUMIN: PREALBUMIN: 35 mg/dL — AB (ref 17–34)

## 2015-11-10 LAB — TSH: TSH: 1.28 u[IU]/mL (ref 0.35–4.50)

## 2015-11-10 MED ORDER — DOXYCYCLINE HYCLATE 100 MG PO TABS: 100.0000 mg | ORAL_TABLET | Freq: Two times a day (BID) | ORAL | Status: DC

## 2015-11-10 MED ORDER — MUPIROCIN CALCIUM 2 % EX CREA: 1.0000 "application " | TOPICAL_CREAM | Freq: Two times a day (BID) | CUTANEOUS | Status: DC

## 2015-11-10 MED ORDER — PREDNISONE 10 MG PO TABS: ORAL_TABLET | ORAL | Status: DC

## 2015-11-10 MED ORDER — DOXYCYCLINE HYCLATE 100 MG PO TBEC: 100.0000 mg | DELAYED_RELEASE_TABLET | Freq: Two times a day (BID) | ORAL | Status: DC

## 2015-11-10 NOTE — Progress Notes (Signed)
Subjective:    Patient ID: NIMRA LASS, female    DOB: 07-07-1956, 59 y.o.   MRN: [id number]  DOS:  11/10/2015 Type of visit - description : cute visit Interval history: 3 days history of increased respiratory symptoms: Cough, sinus and chest congestion, more short of breath than usual. i notice weight loss, she denies lack of food; no postprandial abdominal pain. Also c/o a tender spot at the L buttock, no dc   Review of Systems  Denies fever, chills, night sweats. No chest pain No diarrhea or blood in the stools + Cough with yellowish sputum, increased wheezing from baseline. No hemoptysis Denies depression.  Past Medical History  Diagnosis Date  . Arthritis   . COPD (chronic obstructive pulmonary disease) ([place])   . GERD (gastroesophageal reflux disease)   . Multiple gastric ulcers in the 70s    d/t BC powders  . Hypertension   . Migraines     h/o  . Anxiety   . Anal fissure   . Pneumonia   . Allergy     SEASONAL  . Cataract     BILATERAL    Past Surgical History  Procedure Laterality Date  . Right oophorectomy    . Wrist surgery Right 2015    fx  . Tubal ligation    . Ganglion cyst removal Left remotely     from wrist    Social History   Social History  . Marital Status: Widowed    Spouse Name: N/A  . Number of Children: 2  . Years of Education: N/A   Occupational History  . retired-disability d/t anxiety-djd    Social History Main Topics  . Smoking status: Current Every Day Smoker -- 0.25 packs/day for 32 years    Types: Cigarettes  . Smokeless tobacco: Never Used     Comment: smoking again as of 09-2014   . Alcohol Use: 0.0 oz/week    0 Standard drinks or equivalent per week     Comment: rare  . Drug Use: No  . Sexual Activity: Not on file   Other Topics Concern  . Not on file   Social History Narrative   Lost husband 2014, son lives w/ her, has a boyfriend        Medication List       This list is accurate as of:  11/10/15  5:12 PM.  Always use your most recent med list.               albuterol 108 (90 BASE) MCG/ACT inhaler  Commonly known as:  PROVENTIL HFA;VENTOLIN HFA  Inhale 2 puffs into the lungs every 6 (six) hours as needed for wheezing or shortness of breath.     ALPRAZolam 0.5 MG tablet  Commonly known as:  XANAX  Take 1 tablet (0.5 mg total) by mouth 4 (four) times daily as needed.     augmented betamethasone dipropionate 0.05 % cream  Commonly known as:  DIPROLENE-AF  Apply topically 2 (two) times daily.     diltiazem 2 % Gel  Apply 1 application topically 3 (three) times daily.     doxycycline 100 MG tablet  Commonly known as:  VIBRA-TABS  Take 1 tablet (100 mg total) by mouth 2 (two) times daily.     fluticasone 50 MCG/ACT nasal spray  Commonly known as:  FLONASE  Place 2 sprays into both nostrils daily as needed for allergies or rhinitis.     methadone 10 MG/5ML solution  Commonly  known as:  DOLOPHINE  Take 55 mg by mouth daily.     mupirocin cream 2 %  Commonly known as:  BACTROBAN  Apply 1 application topically 2 (two) times daily.     predniSONE 10 MG tablet  Commonly known as:  DELTASONE  4 tablets x 2 days, 3 tabs x 2 days, 2 tabs x 2 days, 1 tab x 2 days     ranitidine 300 MG tablet  Commonly known as:  ZANTAC  Take 1 tablet (300 mg total) by mouth at bedtime.     triamcinolone cream 0.1 %  Commonly known as:  KENALOG  Apply 1 application topically 2 (two) times daily.     VITAMIN D PO  Take 1 tablet by mouth daily.           Objective:   Physical Exam  Skin:      BP 132/76 mmHg  Pulse 80  Temp(Src) 98 F (36.7 C) (Oral)  Ht 5' 3.5" (1.613 m)  Wt 90 lb (40.824 kg)  BMI 15.69 kg/m2  SpO2 92% General:   Well developed, underweight appearing, no distress, disheveled HEENT:  Normocephalic . Face symmetric, atraumatic Neck: No mass at the neck or supraclavicular area Lungs:  Patient with sounds bilaterally, + + rhonchi with  cough Normal respiratory effort, no intercostal retractions, no accessory muscle use. Heart: RRR,  no murmur.  No pretibial edema bilaterally  Axillary areas without LADs Skin: Not pale. Not jaundice Neurologic:  alert & oriented X3.  Speech normal, gait appropriate for age and unassisted Psych--  Cognition and judgment appear intact.  Cooperative with normal attention span and concentration.  Behavior appropriate. No anxious or depressed appearing.      Assessment & Plan:   Assessment HTN Anxiety-insomnia: Xanax rx by pcp (60/month) Pulmonary  --Snoring, poor sleep, sleep study neg for OSA (09-2014) --COPD, never PFTs ($$) -- smoker GERD MSK --DJD --Sees the pain management clinic Underweight    PLAN COPD exacerbation-- abx, steroids, see instructions Underweight-- worse, + wt loss lately,check a CXR, TSH, CBC (last wbc slt elevated), prealbumin  Skin lesion-- bactroban prn  RTC 3 months

## 2015-11-10 NOTE — Telephone Encounter (Signed)
Patient states there was an ABO [facility] for [facility] in the past that was more affordable, would like to know if you would send this.

## 2015-11-10 NOTE — Assessment & Plan Note (Signed)
COPD exacerbation-- abx, steroids, see instructions Underweight-- worse, + wt loss lately,check a CXR, TSH, CBC (last wbc slt elevated), prealbumin  Skin lesion-- bactroban prn  RTC 3 months

## 2015-11-10 NOTE — Telephone Encounter (Signed)
Advise patient: I sent a new prescription for doxycycline, a different formulation, should be much less expensive. As far as the ointment, nothing I can do, recommend to use OTC antibiotic ointment.

## 2015-11-10 NOTE — Patient Instructions (Addendum)
GO TO THE LAB TO GET YOUR BLOOD WORK  MAKE AN APPOINTMENT FOR 2-3 MONTHS  GET A XR AT THE FIRST FLOOR   Rest, fluids , tylenol  For cough:  Take Mucinex DM twice a day as needed until better  For nasal congestion: Use OTC Nasocort or Flonase : 2 nasal sprays on each side of the nose in the morning until you feel better   Take the antibiotic as prescribed  (doxy)  Take prednisone as prescribed   Continue inhalers  Call if not gradually better over the next  10 days  Call anytime if the symptoms are severe   Use bactroban twice a day

## 2015-11-10 NOTE — Progress Notes (Signed)
Pre visit review using our clinic review tool, if applicable. No additional management support is needed unless otherwise documented below in the visit note. 

## 2015-11-10 NOTE — Telephone Encounter (Signed)
The antibiotic and cream sent in today both were too expensive for this patient.  Please offer alternative.

## 2015-11-11 NOTE — Telephone Encounter (Signed)
Patient informed of PCP instructions. She is stating OTC does not work and certain PCP prescribed in the past antibiotic cream. But ok to continue for now OTC and antibiotic pills.

## 2015-11-11 NOTE — Telephone Encounter (Signed)
The patient still has not received an alternative for the expensive cream.  She also stated PCP suggested a referral to a Dermatologist for bump on her buttock.  She would like that referral placed.

## 2015-11-11 NOTE — Telephone Encounter (Signed)
Recommend OTC antibiotic cream. I don't think is indicated  to see dermatology, I can recheck the area when she comes back, if she insists, then ok to refer to Dermatology

## 2015-11-18 ENCOUNTER — Telehealth: Payer: Self-pay | Admitting: Internal Medicine

## 2015-11-18 NOTE — Telephone Encounter (Addendum)
Relation to [id number] Call back number:[phone] [facility]acy: [id number] [place], [place] [phone] (Phone) [phone] (Fax)         Reason for call:  Patient states symptoms have not improved from last office visit and would like refill predniSONE (DELTASONE) 10 MG tablet and antibiotics

## 2015-11-19 NOTE — Telephone Encounter (Signed)
Not indicated unless she is seen.

## 2015-11-19 NOTE — Telephone Encounter (Signed)
Will need to be seen again per Dr. Larose Kells.

## 2015-11-19 NOTE — Telephone Encounter (Signed)
Seen 11/10/2015 for COPD exacerbation.

## 2015-11-19 NOTE — Telephone Encounter (Signed)
FYI

## 2015-11-19 NOTE — Telephone Encounter (Signed)
If she is feeling that poorly,  recommend urgent care

## 2015-11-19 NOTE — Telephone Encounter (Signed)
Pt says that it isn't easy for her to come in if need to be seen for predniSONE   medication.   Pt also requested a refill on her doxycycline Rx. She says that she feels as though she would feel better by the weekend if she can get both medications.   Please advise further.   [id number]   Pharmacy: [facility], [place]

## 2015-11-19 NOTE — Telephone Encounter (Signed)
Patient would like PCP to refill medication prior to appointment scheduled 11/22/14 at 1pm.

## 2015-11-21 DIAGNOSIS — S92001A Unspecified fracture of right calcaneus, initial encounter for closed fracture: Secondary | ICD-10-CM

## 2015-11-21 HISTORY — DX: Unspecified fracture of right calcaneus, initial encounter for closed fracture: S92.001A

## 2015-11-23 ENCOUNTER — Other Ambulatory Visit: Payer: Self-pay | Admitting: Internal Medicine

## 2015-11-23 ENCOUNTER — Encounter: Payer: Self-pay | Admitting: Internal Medicine

## 2015-11-23 ENCOUNTER — Ambulatory Visit (INDEPENDENT_AMBULATORY_CARE_PROVIDER_SITE_OTHER): Payer: Medicare Other | Admitting: Internal Medicine

## 2015-11-23 VITALS — BP 106/78 | HR 94 | Temp 98.0°F | Ht 63.5 in | Wt 90.4 lb

## 2015-11-23 DIAGNOSIS — L989 Disorder of the skin and subcutaneous tissue, unspecified: Secondary | ICD-10-CM

## 2015-11-23 DIAGNOSIS — R634 Abnormal weight loss: Secondary | ICD-10-CM

## 2015-11-23 DIAGNOSIS — J441 Chronic obstructive pulmonary disease with (acute) exacerbation: Secondary | ICD-10-CM | POA: Diagnosis not present

## 2015-11-23 MED ORDER — DOXYCYCLINE HYCLATE 100 MG PO TABS: 100.0000 mg | ORAL_TABLET | Freq: Two times a day (BID) | ORAL | Status: DC

## 2015-11-23 MED ORDER — BUDESONIDE-FORMOTEROL FUMARATE 160-4.5 MCG/ACT IN AERO: 2.0000 | INHALATION_SPRAY | Freq: Two times a day (BID) | RESPIRATORY_TRACT | Status: DC

## 2015-11-23 MED ORDER — PREDNISONE 10 MG PO TABS: 10.0000 mg | ORAL_TABLET | Freq: Every day | ORAL | Status: DC

## 2015-11-23 NOTE — Progress Notes (Signed)
Subjective:    Patient ID: Casey Schmidt, female    DOB: 02/22/1956, 60 y.o.   MRN: [id number]  DOS:  11/23/2015 Type of visit - description : Acute Interval history: Having sx for more than 2 weeks ago, had a round of doxycycline and prednisone, improved to some extent but is not back to baseline. Also, has a skin lesion at the left buttock and the patient states is extremely painful  Review of Systems  No fever chills No nausea, vomiting, diarrhea Cough continue to be above baseline, + mucus, yellow in color sometimes.  Past Medical History  Diagnosis Date  . Arthritis   . COPD (chronic obstructive pulmonary disease) ([place])   . GERD (gastroesophageal reflux disease)   . Multiple gastric ulcers in the 70s    d/t BC powders  . Hypertension   . Migraines     h/o  . Anxiety   . Anal fissure   . Pneumonia   . Allergy     SEASONAL  . Cataract     BILATERAL    Past Surgical History  Procedure Laterality Date  . Right oophorectomy    . Wrist surgery Right 2015    fx  . Tubal ligation    . Ganglion cyst removal Left remotely     from wrist    Social History   Social History  . Marital Status: Widowed    Spouse Name: N/A  . Number of Children: 2  . Years of Education: N/A   Occupational History  . retired-disability d/t anxiety-djd    Social History Main Topics  . Smoking status: Current Every Day Smoker -- 0.25 packs/day for 32 years    Types: Cigarettes  . Smokeless tobacco: Never Used     Comment: smoking again as of 09-2014   . Alcohol Use: 0.0 oz/week    0 Standard drinks or equivalent per week     Comment: rare  . Drug Use: No  . Sexual Activity: Not on file   Other Topics Concern  . Not on file   Social History Narrative   Lost husband 2014, son lives w/ her, has a boyfriend        Medication List       This list is accurate as of: 11/23/15  6:32 PM.  Always use your most recent med list.               albuterol 108 (90 Base)  MCG/ACT inhaler  Commonly known as:  PROVENTIL HFA;VENTOLIN HFA  Inhale 2 puffs into the lungs every 6 (six) hours as needed for wheezing or shortness of breath.     ALPRAZolam 0.5 MG tablet  Commonly known as:  XANAX  Take 1 tablet (0.5 mg total) by mouth 4 (four) times daily as needed.     augmented betamethasone dipropionate 0.05 % cream  Commonly known as:  DIPROLENE-AF  Apply topically 2 (two) times daily.     budesonide-formoterol 160-4.5 MCG/ACT inhaler  Commonly known as:  SYMBICORT  Inhale 2 puffs into the lungs 2 (two) times daily.     diltiazem 2 % Gel  Apply 1 application topically 3 (three) times daily.     doxycycline 100 MG tablet  Commonly known as:  VIBRA-TABS  Take 1 tablet (100 mg total) by mouth 2 (two) times daily.     ENSURE  Take 3 Cans by mouth daily.     fluticasone 50 MCG/ACT nasal spray  Commonly known as:  FLONASE  Place 2 sprays into both nostrils daily as needed for allergies or rhinitis.     methadone 10 MG/5ML solution  Commonly known as:  DOLOPHINE  Take 55 mg by mouth daily.     mupirocin cream 2 %  Commonly known as:  BACTROBAN  Apply 1 application topically 2 (two) times daily.     predniSONE 10 MG tablet  Commonly known as:  DELTASONE  Take 1 tablet (10 mg total) by mouth daily. 2 tabs a day x 5 days     ranitidine 300 MG tablet  Commonly known as:  ZANTAC  Take 1 tablet (300 mg total) by mouth at bedtime.     triamcinolone cream 0.1 %  Commonly known as:  KENALOG  Apply 1 application topically 2 (two) times daily.     VITAMIN D PO  Take 1 tablet by mouth daily.           Objective:   Physical Exam BP 106/78 mmHg  Pulse 94  Temp(Src) 98 F (36.7 C) (Oral)  Ht 5' 3.5" (1.613 m)  Wt 90 lb 6 oz (40.994 kg)  BMI 15.76 kg/m2  SpO2 94% General:   Well developed, well nourished . NAD.  HEENT:  Normocephalic . Face symmetric, atraumatic TMs normal, nose is slightly congested, sinuses no TTP Lungs:  Rhonchi  bilaterally, increased expiratory time, no actual wheezing or rales Normal respiratory effort, no intercostal retractions, no accessory muscle use. Heart: RRR,  no murmur.  No pretibial edema bilaterally  Skin: Previously described location at the left buttock still there, slightly erythematous, has a very superficial ulcer on top. No blisters. Neurologic:  alert & oriented X3.  Speech normal, gait appropriate for age and unassisted Psych--  Cognition and judgment appear intact.  Cooperative with normal attention span and concentration.  Behavior appropriate. No anxious or depressed appearing.      Assessment & Plan:   Assessment HTN Anxiety-insomnia: Xanax rx by pcp (60/month) Pulmonary  --Snoring, poor sleep, sleep study neg for OSA [id number] --COPD, never PFTs ($$) -- smoker GERD MSK --DJD --Sees the pain management clinic Underweight    PLAN COPD exacerbation: Not improving after antibiotics and steroids. Plan: Second round of antibiotics, low dose of prednisone, start Symbicort, quit tobacco! Underweight: Recent chest x-ray was negative, TSH normal. WBCs elevated but she was acutely ill, will recheck a CBC on return to the office. Consider hematology referral.. Skin lesion: Patient reports is extremely tender and needs something to be done. Refer to dermatology. RTC 2 months

## 2015-11-23 NOTE — Patient Instructions (Addendum)
Take a second round of doxycycline  Take prednisone for 5 days  Start Symbicort 2 puffs twice a day  Continue using albuterol as needed for cough wheezing  Stop tobacco!  We'll refer you to a dermatologist  Please come back in 2 months, make an appointment at the front desk.

## 2015-11-23 NOTE — Progress Notes (Signed)
Pre visit review using our clinic review tool, if applicable. No additional management support is needed unless otherwise documented below in the visit note. 

## 2015-11-23 NOTE — Telephone Encounter (Signed)
Pt has acute visit today at 1300.

## 2015-12-01 ENCOUNTER — Other Ambulatory Visit: Payer: Self-pay | Admitting: Internal Medicine

## 2015-12-02 ENCOUNTER — Telehealth: Payer: Self-pay | Admitting: Internal Medicine

## 2015-12-02 ENCOUNTER — Other Ambulatory Visit: Payer: Self-pay | Admitting: Internal Medicine

## 2015-12-02 NOTE — Telephone Encounter (Signed)
Caller name: Self  Can be reached: [phone] (H) or [phone] (C)   Reason for call: Patient is requesting a refill on ALPRAZolam (XANAX) 0.5 MG tablet [id number]

## 2015-12-02 NOTE — Telephone Encounter (Signed)
Called patient to inform her of below. Patient states she use to get 120 per month but have only been getting 80 since seeing Dr. Larose Kells. She states that she received 80 tablets on 11/02/15 and it is time for her to get a refill. Patient states she saw Dr. Larose Kells last week and was informed that she could get them this week. Plse adv.

## 2015-12-02 NOTE — Telephone Encounter (Signed)
Pt was given printed Rx on her OV on 11/02/2015, #80 tablets and 0 RF. She should not need refills.

## 2015-12-03 ENCOUNTER — Telehealth: Payer: Self-pay | Admitting: Internal Medicine

## 2015-12-03 NOTE — Telephone Encounter (Signed)
Patient called stating that she had just cut herself accidentilly and was bleeding. Patient wanted to come into the office before 5 pm to get stitches. Transferred her to [facility] @ [phone]

## 2015-12-03 NOTE — Telephone Encounter (Signed)
FYI

## 2015-12-03 NOTE — Telephone Encounter (Signed)
[facility] - Client [place]    --------------------------------------------------------------------------------   Patient Name: Casey Schmidt  Gender: Female  DOB: Sep 20, 1956   Age: 60 Y 21 D  Return Phone Number: [phone] (Primary), [phone] (Secondary)  Address:     City/State/Zip:  [place]     [facility] - Client  Client Site [facility] - Day  Physician Larose Kells, [place]   Contact Type Call  Call Type Triage / Clinical  Relationship To Patient Self  Appointment Disposition EMR Appointment Not Necessary  Info pasted into Epic Yes  Return Phone Number [phone] (Primary)  Chief Complaint Finger Injury  Initial Comment Caller says she cut her thumb 30 min at the joint ago; cut is quarter inch but is deep. Has a towel wraped tight keeps bleeding contained but it starts if she takes it off; and it bleeds a lot   PreDisposition Go to Urgent Care/Walk-In Clinic       Nurse Assessment  Nurse: Venetia Maxon, RN, Manuela Schwartz Date/Time (Eastern Time): 12/03/2015 4:33:58 PM  Confirm and document reason for call. If symptomatic, describe symptoms. You must click the next button to save text entered. ---Caller says she cut her thumb 30 min at the joint ago; cut is quarter inch but is deep. Has a towel wraped tight keeps bleeding contained but it starts if she takes it off; and it bleeds a lot She states she was cutting a candle with a tea knife and cut her left thumb. and it was the top of the thumb. not on blood thinners.    Has the patient traveled out of the country within the last 30 days? ---No    Does the patient have any new or worsening symptoms? ---Yes    Will a triage be completed? ---Yes    Related visit to physician within the last 2 weeks? ---No    Does the PT have any chronic conditions? (i.e. diabetes, asthma, etc.) ---Yes    List chronic conditions.  ---Bronchitis HTN COPD cholesterol arthritis anxiety    Is this a behavioral health or substance abuse call? ---No           Guidelines          Guideline Title Affirmed Question Affirmed Notes Nurse Date/Time (Eastern Time)  Cuts and Lacerations Skin is split open or gaping (or length > 1/2 inch or 12 mm on the skin, 1/4 inch or 6 mm on the face)    Venetia Maxon, RN, Manuela Schwartz 12/03/2015 4:36:26 PM    Disp. Time Eilene Ghazi Time) Disposition Final User         12/03/2015 4:40:04 PM Go to ED Now Yes Venetia Maxon, RN, Edwena Bunde Understands: Yes  Disagree/Comply: Comply       Care Advice Given Per Guideline        GO TO ED NOW: You need to be seen in the Emergency Department. Go to the ER at ___________ [place] now. Drive carefully. ALTERNATE DISPOSITION - URGENT CARE CENTER: An Urgent [place] can usually manage this problem, IF one is available in the caller's area. DRESSING: Cover with a sterile gauze or clean cloth until seen. CARE ADVICE given per Cuts and Lacerations (Adult) guideline. CLEANSING LACERATIONS:    After Care Instructions Given        Call Event Type User Date /  Time Description        --------------------------------------------------------------------------------            Referrals  [facility] - ED

## 2015-12-06 NOTE — Telephone Encounter (Signed)
Noted, thx.

## 2015-12-07 ENCOUNTER — Encounter (HOSPITAL_BASED_OUTPATIENT_CLINIC_OR_DEPARTMENT_OTHER): Payer: Self-pay | Admitting: *Deleted

## 2015-12-07 ENCOUNTER — Emergency Department (HOSPITAL_BASED_OUTPATIENT_CLINIC_OR_DEPARTMENT_OTHER): Payer: Medicare Other

## 2015-12-07 DIAGNOSIS — M1990 Unspecified osteoarthritis, unspecified site: Secondary | ICD-10-CM | POA: Insufficient documentation

## 2015-12-07 DIAGNOSIS — Y9301 Activity, walking, marching and hiking: Secondary | ICD-10-CM | POA: Diagnosis not present

## 2015-12-07 DIAGNOSIS — S92001A Unspecified fracture of right calcaneus, initial encounter for closed fracture: Secondary | ICD-10-CM | POA: Diagnosis not present

## 2015-12-07 DIAGNOSIS — F419 Anxiety disorder, unspecified: Secondary | ICD-10-CM | POA: Insufficient documentation

## 2015-12-07 DIAGNOSIS — F17210 Nicotine dependence, cigarettes, uncomplicated: Secondary | ICD-10-CM | POA: Insufficient documentation

## 2015-12-07 DIAGNOSIS — Z8701 Personal history of pneumonia (recurrent): Secondary | ICD-10-CM | POA: Insufficient documentation

## 2015-12-07 DIAGNOSIS — I10 Essential (primary) hypertension: Secondary | ICD-10-CM | POA: Diagnosis not present

## 2015-12-07 DIAGNOSIS — H269 Unspecified cataract: Secondary | ICD-10-CM | POA: Diagnosis not present

## 2015-12-07 DIAGNOSIS — W1839XA Other fall on same level, initial encounter: Secondary | ICD-10-CM | POA: Diagnosis not present

## 2015-12-07 DIAGNOSIS — Z880 Allergy status to penicillin: Secondary | ICD-10-CM | POA: Insufficient documentation

## 2015-12-07 DIAGNOSIS — K219 Gastro-esophageal reflux disease without esophagitis: Secondary | ICD-10-CM | POA: Insufficient documentation

## 2015-12-07 DIAGNOSIS — Z7951 Long term (current) use of inhaled steroids: Secondary | ICD-10-CM | POA: Insufficient documentation

## 2015-12-07 DIAGNOSIS — J449 Chronic obstructive pulmonary disease, unspecified: Secondary | ICD-10-CM | POA: Diagnosis not present

## 2015-12-07 DIAGNOSIS — S99921A Unspecified injury of right foot, initial encounter: Secondary | ICD-10-CM | POA: Diagnosis present

## 2015-12-07 DIAGNOSIS — Z79899 Other long term (current) drug therapy: Secondary | ICD-10-CM | POA: Insufficient documentation

## 2015-12-07 DIAGNOSIS — Y998 Other external cause status: Secondary | ICD-10-CM | POA: Diagnosis not present

## 2015-12-07 DIAGNOSIS — Y9289 Other specified places as the place of occurrence of the external cause: Secondary | ICD-10-CM | POA: Diagnosis not present

## 2015-12-07 NOTE — ED Notes (Signed)
Pain in her right heel. She fell in the floor 2 hours ago.

## 2015-12-08 ENCOUNTER — Encounter (HOSPITAL_BASED_OUTPATIENT_CLINIC_OR_DEPARTMENT_OTHER): Payer: Self-pay | Admitting: Emergency Medicine

## 2015-12-08 ENCOUNTER — Telehealth: Payer: Self-pay | Admitting: Internal Medicine

## 2015-12-08 ENCOUNTER — Emergency Department (HOSPITAL_BASED_OUTPATIENT_CLINIC_OR_DEPARTMENT_OTHER)
Admission: EM | Admit: 2015-12-08 | Discharge: 2015-12-08 | Disposition: A | Discharge status: Home / Self Care | Payer: Medicare Other | Attending: Emergency Medicine | Admitting: Emergency Medicine

## 2015-12-08 DIAGNOSIS — W19XXXA Unspecified fall, initial encounter: Secondary | ICD-10-CM

## 2015-12-08 DIAGNOSIS — S92001A Unspecified fracture of right calcaneus, initial encounter for closed fracture: Secondary | ICD-10-CM

## 2015-12-08 MED ORDER — OXYCODONE-ACETAMINOPHEN 5-325 MG PO TABS
1.0000 | ORAL_TABLET | Freq: Once | ORAL | Status: AC
  Administered 2015-12-08: 1 via ORAL
  Filled 2015-12-08: qty 1

## 2015-12-08 MED ORDER — FREE SPIRIT KNEE/LEG WALKER MISC: 1.0000 [IU] | Status: DC

## 2015-12-08 MED ORDER — KETOROLAC TROMETHAMINE 60 MG/2ML IM SOLN
60.0000 mg | Freq: Once | INTRAMUSCULAR | Status: AC
  Administered 2015-12-08: 60 mg via INTRAMUSCULAR
  Filled 2015-12-08: qty 2

## 2015-12-08 MED ORDER — ALPRAZOLAM 0.5 MG PO TABS: 0.5000 mg | ORAL_TABLET | Freq: Four times a day (QID) | ORAL | Status: DC | PRN

## 2015-12-08 NOTE — ED Notes (Signed)
Removed first splint due to Pt stating the splint was making the pain worst. Once splint was removed, Pt stated she just could not tell if her pain got better with the splint off, and I informed MD Palumbo & RN Judson Roch. RN Judson Roch spoke with Pt and informed me that she would be ok to redo her splint. Pt state the second splint was better, and I went over what happened with Pts son.

## 2015-12-08 NOTE — Telephone Encounter (Signed)
Rx printed, awaiting MD signature.  

## 2015-12-08 NOTE — Telephone Encounter (Signed)
Okay #80, 1 refill We can discuss a bone density test at her regular office visit

## 2015-12-08 NOTE — ED Provider Notes (Signed)
CSN: [id number]     Arrival date & time 12/07/15  2238 History   First MD Initiated Contact with Patient 12/08/15 0035     Chief Complaint  Patient presents with  . Foot Pain     (Consider location/radiation/quality/duration/timing/severity/associated sxs/prior Treatment) Patient is a 60 y.o. female presenting with foot injury. The history is provided by the patient.  Foot Injury Location:  Foot Time since incident:  3 hours Injury: yes   Mechanism of injury: fall   Fall:    Fall occurred:  Walking   Impact surface:  Hard floor   Point of impact:  Feet   Entrapped after fall: no   Foot location:  Sole of R foot Pain details:    Quality:  Aching   Radiates to:  Does not radiate   Severity:  Severe   Onset quality:  Sudden   Timing:  Constant   Progression:  Unchanged Chronicity:  New Dislocation: no   Foreign body present:  No foreign bodies Prior injury to area:  No Relieved by:  Nothing Worsened by:  Nothing tried Ineffective treatments: methadone and xanax. Associated symptoms: no back pain and no tingling   Risk factors: no concern for non-accidental trauma     Past Medical History  Diagnosis Date  . Arthritis   . COPD (chronic obstructive pulmonary disease) ([place])   . GERD (gastroesophageal reflux disease)   . Multiple gastric ulcers in the 70s    d/t BC powders  . Hypertension   . Migraines     h/o  . Anxiety   . Anal fissure   . Pneumonia   . Allergy     SEASONAL  . Cataract     BILATERAL   Past Surgical History  Procedure Laterality Date  . Right oophorectomy    . Wrist surgery Right 2015    fx  . Tubal ligation    . Ganglion cyst removal Left remotely     from wrist   Family History  Problem Relation Age of Onset  . Hypertension Mother   . Emphysema Mother   . Hypertension Father   . Skin cancer Father   . Stroke Father   . Colon cancer Maternal Grandmother   . Breast cancer Neg Hx   . CAD Neg Hx   . Colon polyps Paternal Aunt   .  Heart disease Paternal Grandfather    Social History  Substance Use Topics  . Smoking status: Current Every Day Smoker -- 0.25 packs/day for 32 years    Types: Cigarettes  . Smokeless tobacco: Never Used     Comment: smoking again as of 09-2014   . Alcohol Use: 0.0 oz/week    0 Standard drinks or equivalent per week     Comment: rare   OB History    No data available     Review of Systems  Musculoskeletal: Negative for back pain.  All other systems reviewed and are negative.     Allergies  Amoxicillin; Aspirin; Clarithromycin; Codeine; Erythromycin; Nsaids; and Tramadol  Home Medications   Prior to Admission medications   Medication Sig Start Date End Date Taking? Authorizing Provider  albuterol (PROVENTIL HFA;VENTOLIN HFA) 108 (90 BASE) MCG/ACT inhaler Inhale 2 puffs into the lungs every 6 (six) hours as needed for wheezing or shortness of breath. 07/20/15   Colon Branch, MD  ALPRAZolam Duanne Moron) 0.5 MG tablet Take 1 tablet (0.5 mg total) by mouth 4 (four) times daily as needed. 11/02/15   Jose  Ladona Horns, MD  augmented betamethasone dipropionate (DIPROLENE-AF) 0.05 % cream Apply topically 2 (two) times daily. Patient not taking: Reported on 11/10/2015 09/16/15   Colon Branch, MD  budesonide-formoterol [facility]) 160-4.5 MCG/ACT inhaler Inhale 2 puffs into the lungs 2 (two) times daily. 11/23/15   Colon Branch, MD  Cholecalciferol (VITAMIN D PO) Take 1 tablet by mouth daily.    Historical Provider, MD  diltiazem 2 % GEL Apply 1 application topically 3 (three) times daily. 07/20/15   Ladene Artist, MD  doxycycline (VIBRA-TABS) 100 MG tablet Take 1 tablet (100 mg total) by mouth 2 (two) times daily. 11/23/15   Colon Branch, MD  ENSURE (ENSURE) Take 3 Cans by mouth daily.    Historical Provider, MD  fluticasone (FLONASE) 50 MCG/ACT nasal spray Place 2 sprays into both nostrils daily as needed for allergies or rhinitis. 06/25/15   Colon Branch, MD  methadone (DOLOPHINE) 10 MG/5ML solution Take 55 mg  by mouth daily.    Historical Provider, MD  mupirocin cream (BACTROBAN) 2 % Apply 1 application topically 2 (two) times daily. 11/10/15   Colon Branch, MD  predniSONE (DELTASONE) 10 MG tablet Take 1 tablet (10 mg total) by mouth daily. 2 tabs a day x 5 days 11/23/15   Colon Branch, MD  ranitidine (ZANTAC) 300 MG tablet Take 1 tablet (300 mg total) by mouth at bedtime. 06/25/15   Colon Branch, MD  triamcinolone cream (KENALOG) 0.1 % Apply 1 application topically 2 (two) times daily. Patient not taking: Reported on 11/10/2015 08/20/15   Owens Loffler, MD   BP 137/81 mmHg  Pulse 78  Temp(Src) 98 F (36.7 C) (Oral)  Resp 18  Ht 5\' 3"  (1.6 m)  Wt 90 lb (40.824 kg)  BMI 15.95 kg/m2  SpO2 95% Physical Exam  Constitutional: She is oriented to person, place, and time. She appears well-developed and well-nourished. No distress.  HENT:  Head: Normocephalic and atraumatic. Head is without raccoon's eyes and without Battle's sign.  Right Ear: No mastoid tenderness. No hemotympanum.  Left Ear: No mastoid tenderness. No hemotympanum.  Mouth/Throat: Oropharynx is clear and moist.  Eyes: Conjunctivae are normal. Pupils are equal, round, and reactive to light.  Neck: Normal range of motion. Neck supple.  Cardiovascular: Normal rate, regular rhythm and intact distal pulses.   Pulmonary/Chest: Effort normal and breath sounds normal. No respiratory distress. She has no wheezes. She has no rales.  Abdominal: Soft. Bowel sounds are normal. There is no tenderness. There is no rebound and no guarding.  Musculoskeletal: Normal range of motion.       Right ankle: Normal.       Right foot: There is tenderness. There is normal range of motion, normal capillary refill, no crepitus and no laceration.       Feet:  Neurological: She is alert and oriented to person, place, and time. She has normal reflexes.  Skin: Skin is warm and dry.  Psychiatric: She has a normal mood and affect.    ED Course  Procedures (including  critical care time) Labs Review Labs Reviewed - No data to display  Imaging Review Dg Foot Complete Right  12/08/2015  CLINICAL DATA:  Status post fall, with injury to right foot. Right calcaneal pain and bruising. Initial encounter. EXAM: RIGHT FOOT COMPLETE - 3+ VIEW COMPARISON:  None. FINDINGS: There is a mildly displaced fracture through the posterior aspect of the calcaneus, with overlying edema within Kager's fat pad. The joint  spaces are preserved. There is no evidence of talar subluxation; the subtalar joint is unremarkable in appearance. IMPRESSION: Mildly displaced fracture through the posterior aspect of the calcaneus, with overlying edema in Kager's fat pad. Electronically Signed   By: Garald Balding M.D.   On: 12/08/2015 00:28   I have personally reviewed and evaluated these images and lab results as part of my medical decision-making.   EKG Interpretation None      MDM   Final diagnoses:  Fall    Patient with severe COPD with calcaneal fracture from fall. Patient is in the methadone clinic for chronic pain and is also on xanax as well and cannot take NSAIDs per her report.  She will need to see them for titration of her medication during this acute illness. Knee scooter RX ordered.  Follow up with orthopedics.      Veatrice Kells, MD 12/08/15 2257

## 2015-12-08 NOTE — Telephone Encounter (Signed)
[facility]  

## 2015-12-08 NOTE — ED Notes (Signed)
Pt verbalizes understanding of d/c instructions and denies any further needs at this time. 

## 2015-12-08 NOTE — Telephone Encounter (Signed)
Pt called for refill on xanax. She has 2 left. She is taking 4/day.  [id number] [place], [place]

## 2015-12-08 NOTE — Telephone Encounter (Signed)
Pt is requesting refill on Alprazolam.  Last OV: 11/23/2015 Last Fill: 11/02/2015 #80 and 0RF Pt is taking 1 tablet qid everyday, not PRN UDS: 06/24/2015   Also, Pt seen today at ED s/p fall, calcaneal fracture. Do you want an ED F/U scheduled? Possible bone density?  Please advise.

## 2015-12-09 DIAGNOSIS — S92001A Unspecified fracture of right calcaneus, initial encounter for closed fracture: Secondary | ICD-10-CM | POA: Diagnosis not present

## 2015-12-20 DIAGNOSIS — H2511 Age-related nuclear cataract, right eye: Secondary | ICD-10-CM | POA: Diagnosis not present

## 2015-12-20 DIAGNOSIS — H25011 Cortical age-related cataract, right eye: Secondary | ICD-10-CM | POA: Diagnosis not present

## 2015-12-23 DIAGNOSIS — S92001D Unspecified fracture of right calcaneus, subsequent encounter for fracture with routine healing: Secondary | ICD-10-CM | POA: Diagnosis not present

## 2015-12-28 DIAGNOSIS — H2511 Age-related nuclear cataract, right eye: Secondary | ICD-10-CM | POA: Diagnosis not present

## 2015-12-29 ENCOUNTER — Encounter: Payer: Self-pay | Admitting: Gastroenterology

## 2015-12-30 DIAGNOSIS — Y929 Unspecified place or not applicable: Secondary | ICD-10-CM | POA: Diagnosis not present

## 2015-12-30 DIAGNOSIS — Y999 Unspecified external cause status: Secondary | ICD-10-CM | POA: Diagnosis not present

## 2015-12-30 DIAGNOSIS — G8918 Other acute postprocedural pain: Secondary | ICD-10-CM | POA: Diagnosis not present

## 2015-12-30 DIAGNOSIS — S92034A Nondisplaced avulsion fracture of tuberosity of right calcaneus, initial encounter for closed fracture: Secondary | ICD-10-CM | POA: Diagnosis not present

## 2015-12-30 DIAGNOSIS — S92001A Unspecified fracture of right calcaneus, initial encounter for closed fracture: Secondary | ICD-10-CM | POA: Diagnosis not present

## 2015-12-30 HISTORY — PX: ORIF CALCANEAL FRACTURE: SUR921

## 2016-01-21 ENCOUNTER — Ambulatory Visit: Payer: Medicare Other | Admitting: Internal Medicine

## 2016-02-01 ENCOUNTER — Other Ambulatory Visit: Payer: Self-pay | Admitting: Internal Medicine

## 2016-02-01 NOTE — Telephone Encounter (Signed)
Pt is requesting refill on Alprazolam.  Last OV: 11/23/2015 Last Fill: 12/08/2015 #80 and 1RF Pt sig: 1 tablet qid PRN UDS: 06/24/2015 Low risk  Please advise.

## 2016-02-02 NOTE — Telephone Encounter (Signed)
Rx printed, awaiting MD signature.  

## 2016-02-02 NOTE — Telephone Encounter (Signed)
Okay #80 and 2 refill

## 2016-02-02 NOTE — Telephone Encounter (Signed)
[facility]  

## 2016-02-09 ENCOUNTER — Ambulatory Visit: Payer: Medicare Other | Admitting: Internal Medicine

## 2016-02-16 DIAGNOSIS — S92001D Unspecified fracture of right calcaneus, subsequent encounter for fracture with routine healing: Secondary | ICD-10-CM | POA: Diagnosis not present

## 2016-02-18 ENCOUNTER — Telehealth: Payer: Self-pay | Admitting: Internal Medicine

## 2016-02-18 DIAGNOSIS — M159 Polyosteoarthritis, unspecified: Secondary | ICD-10-CM

## 2016-02-18 NOTE — Telephone Encounter (Signed)
Please advise. Okay to place pain medicine referral?

## 2016-02-18 NOTE — Telephone Encounter (Signed)
Pt called in to check the status of a pain management referral . Not showing a referral. Please assist further.     CB: [phone]

## 2016-02-18 NOTE — Telephone Encounter (Signed)
Referral placed.

## 2016-02-18 NOTE — Telephone Encounter (Signed)
I liked to refer her to pain management back in August 2016, if a referral is not in, please enter it, dx DJD

## 2016-03-15 DIAGNOSIS — S92001D Unspecified fracture of right calcaneus, subsequent encounter for fracture with routine healing: Secondary | ICD-10-CM | POA: Diagnosis not present

## 2016-03-30 ENCOUNTER — Telehealth: Payer: Self-pay | Admitting: Internal Medicine

## 2016-03-30 NOTE — Telephone Encounter (Signed)
Received a letter from date [facility], the patient is under the care and receive 95 mg of methadone daily. They need to know what medications we are prescribing. Response: We prescribed Xanax on regular basis  Also, call patient, due for an  appointment, please schedule.

## 2016-03-31 NOTE — Telephone Encounter (Signed)
Called pt and advised of the below. Pt says that she has a broken heel and is unable to come in. Pt refused scheduling an appt.

## 2016-03-31 NOTE — Telephone Encounter (Signed)
Thx ,noted

## 2016-03-31 NOTE — Telephone Encounter (Signed)
FYI

## 2016-04-27 DIAGNOSIS — S93601A Unspecified sprain of right foot, initial encounter: Secondary | ICD-10-CM | POA: Diagnosis not present

## 2016-05-05 ENCOUNTER — Telehealth: Payer: Self-pay | Admitting: Internal Medicine

## 2016-05-05 ENCOUNTER — Other Ambulatory Visit: Payer: Self-pay | Admitting: Internal Medicine

## 2016-05-05 MED ORDER — ALPRAZOLAM 0.5 MG PO TABS: 0.5000 mg | ORAL_TABLET | Freq: Four times a day (QID) | ORAL | Status: DC | PRN

## 2016-05-05 NOTE — Telephone Encounter (Signed)
Pt is requesting refill on Alprazolam.  Last OV: 11/23/2015, appt scheduled for 05/09/2016 Last Fill: 02/02/2016 #80 and 2RF Pt sig: 1 tablet qid PRN UDS: 06/24/2015 Low risk  Please advise.

## 2016-05-05 NOTE — Telephone Encounter (Signed)
°  Relationship to patient: Self  Can be reached: [phone]    Reason for call: Patient request refill on ALPRAZolam (XANAX) 0.5 MG tablet. States she realize she need to come in for a follow up and has scheduled to come in on Tuesday 6/20

## 2016-05-05 NOTE — Telephone Encounter (Addendum)
Okay #80 and no refills

## 2016-05-05 NOTE — Telephone Encounter (Signed)
Rx printed, awaiting MD signature.  

## 2016-05-05 NOTE — Telephone Encounter (Signed)
[facility]  

## 2016-05-09 ENCOUNTER — Ambulatory Visit: Payer: Medicare Other | Admitting: Internal Medicine

## 2016-05-09 NOTE — Telephone Encounter (Signed)
Refills sent on 05/05/2016, appt that was scheduled today 05/09/2016 cancelled. No further refills will be given to Pt until she has been seen.

## 2016-06-04 ENCOUNTER — Other Ambulatory Visit: Payer: Self-pay | Admitting: Internal Medicine

## 2016-06-06 ENCOUNTER — Other Ambulatory Visit: Payer: Self-pay | Admitting: Internal Medicine

## 2016-06-06 NOTE — Telephone Encounter (Signed)
°  Relation to [id number] Call back number:[phone] [id number] [place], [place] [phone] (Phone) [phone] (Fax)         Reason for call:  \Patient requesting a refill ALPRAZolam (XANAX) 0.5 MG tablet. Patient has a scheduled appt for 07/07/2016. Patient was admant about Rx being filled prior to PCP going on vacation; please advise

## 2016-06-06 NOTE — Telephone Encounter (Signed)
See telephone note 06/06/2016. Refill denied, per Dr. Larose Kells will need OV before refills can be given.

## 2016-06-06 NOTE — Telephone Encounter (Signed)
Unable to refill

## 2016-06-06 NOTE — Telephone Encounter (Signed)
Per Dr. Larose Kells, he will be unable to refill until Pt can be seen.

## 2016-06-06 NOTE — Telephone Encounter (Signed)
Patient scheduled for medication refill for 06/08/2016 with Johnnette Gourd.

## 2016-06-06 NOTE — Telephone Encounter (Signed)
Pt is requesting refill on Alprazolam. Last filled on 05/05/2016 #80 and 0RF, and informed no further refills w/o visit. Pt cancelled appt for 05/09/2016 on 05/08/2016. Pt has scheduled another follow-up appt for 07/07/2016 and requesting refill. Please advise.

## 2016-06-08 ENCOUNTER — Encounter: Payer: Self-pay | Admitting: Medical

## 2016-06-08 ENCOUNTER — Ambulatory Visit (INDEPENDENT_AMBULATORY_CARE_PROVIDER_SITE_OTHER): Payer: Medicare Other | Admitting: Medical

## 2016-06-08 ENCOUNTER — Ambulatory Visit: Payer: Medicare Other | Admitting: Medical

## 2016-06-08 VITALS — BP 102/78 | HR 88 | Temp 98.1°F | Ht 63.5 in | Wt 87.4 lb

## 2016-06-08 DIAGNOSIS — F419 Anxiety disorder, unspecified: Secondary | ICD-10-CM | POA: Diagnosis not present

## 2016-06-08 MED ORDER — ALPRAZOLAM 0.5 MG PO TABS: 0.5000 mg | ORAL_TABLET | Freq: Four times a day (QID) | ORAL | Status: DC | PRN

## 2016-06-08 NOTE — Patient Instructions (Signed)
For your chronic anxiety I did prescribe you your 80 tabs of xanax. You have used for years and won't make changes. For further refills contact Dr. Larose Kells or his MA. Follow up with him as regularly scheduled or as needed

## 2016-06-08 NOTE — Progress Notes (Signed)
Subjective:    Patient ID: Casey Schmidt, female    DOB: 1956-02-09, 60 y.o.   MRN: [id number]  HPI  Pt in with some anxiety. She has history of anxiety and gets xanax for years. She estimates about 30 yrs of use. Pt takes about 80 tablets within a month. Has gotten meds from Dr. Larose Kells for years. Pt mom has a lot of anxiety as well per pt.  Review of Systems  Constitutional: Negative for fever, chills and fatigue.  Respiratory: Negative for cough, chest tightness, shortness of breath and wheezing.   Cardiovascular: Negative for chest pain and palpitations.  Musculoskeletal: Negative for back pain.  Skin: Negative for rash.  Neurological: Negative for dizziness, seizures and weakness.  Hematological: Negative for adenopathy. Does not bruise/bleed easily.  Psychiatric/Behavioral: The patient is nervous/anxious.     Past Medical History  Diagnosis Date  . Arthritis   . COPD (chronic obstructive pulmonary disease) ([place])   . GERD (gastroesophageal reflux disease)   . Multiple gastric ulcers in the 70s    d/t BC powders  . Hypertension   . Migraines     h/o  . Anxiety   . Anal fissure   . Pneumonia   . Allergy     SEASONAL  . Cataract     BILATERAL  . Right calcaneal fracture 2017    ORIF in 12/2015     Social History   Social History  . Marital Status: Widowed    Spouse Name: N/A  . Number of Children: 2  . Years of Education: N/A   Occupational History  . retired-disability d/t anxiety-djd    Social History Main Topics  . Smoking status: Current Every Day Smoker -- 0.25 packs/day for 32 years    Types: Cigarettes  . Smokeless tobacco: Never Used     Comment: smoking again as of 09-2014   . Alcohol Use: 0.0 oz/week    0 Standard drinks or equivalent per week     Comment: rare  . Drug Use: No  . Sexual Activity: Not on file   Other Topics Concern  . Not on file   Social History Narrative   Lost husband 2014, son lives w/ her, has a boyfriend    Past  Surgical History  Procedure Laterality Date  . Right oophorectomy    . Wrist surgery Right 2015    fx  . Tubal ligation    . Ganglion cyst removal Left remotely     from wrist  . Orif calcaneal fracture Right 12/30/2015     Dr. Mardelle Matte    Family History  Problem Relation Age of Onset  . Hypertension Mother   . Emphysema Mother   . Hypertension Father   . Skin cancer Father   . Stroke Father   . Colon cancer Maternal Grandmother   . Breast cancer Neg Hx   . CAD Neg Hx   . Colon polyps Paternal Aunt   . Heart disease Paternal Grandfather     Allergies  Allergen Reactions  . Amoxicillin Other (See Comments)    Blood in stools. Pt states she can't take any "cillins"  (?? See OV note 06-29-15)  . Aspirin Other (See Comments)    "Burns stomach"  . Clarithromycin     REACTION: GI upset  . Codeine     REACTION: GI upset  . Erythromycin     REACTION: GI upset  . Nsaids     REACTION: GI upset  . Tramadol  Other (See Comments)    "Burns stomach"    Current Outpatient Prescriptions on File Prior to Visit  Medication Sig Dispense Refill  . albuterol (PROVENTIL HFA;VENTOLIN HFA) 108 (90 BASE) MCG/ACT inhaler Inhale 2 puffs into the lungs every 6 (six) hours as needed for wheezing or shortness of breath. 1 Inhaler 5  . ALPRAZolam (XANAX) 0.5 MG tablet Take 1 tablet (0.5 mg total) by mouth 4 (four) times daily as needed for anxiety. 80 tablet 0  . budesonide-formoterol (SYMBICORT) 160-4.5 MCG/ACT inhaler Inhale 2 puffs into the lungs 2 (two) times daily. 1 Inhaler 3  . Cholecalciferol (VITAMIN D PO) Take 1 tablet by mouth daily.    Marland Kitchen diltiazem 2 % GEL Apply 1 application topically 3 (three) times daily. 30 g 1  . doxycycline (VIBRA-TABS) 100 MG tablet Take 1 tablet (100 mg total) by mouth 2 (two) times daily. 14 tablet 0  . ENSURE (ENSURE) Take 3 Cans by mouth daily.    . fluticasone (FLONASE) 50 MCG/ACT nasal spray Place 2 sprays into both nostrils daily as needed for allergies or  rhinitis. 16 g 3  . methadone (DOLOPHINE) 10 MG/5ML solution Take 55 mg by mouth daily.    . Misc. Devices (FREE SPIRIT KNEE/LEG WALKER) MISC 1 Units by Does not apply route as directed. 1 each 0  . mupirocin cream (BACTROBAN) 2 % Apply 1 application topically 2 (two) times daily. 60 g 1  . predniSONE (DELTASONE) 10 MG tablet Take 1 tablet (10 mg total) by mouth daily. 2 tabs a day x 5 days 10 tablet 0  . ranitidine (ZANTAC) 300 MG tablet Take 1 tablet (300 mg total) by mouth at bedtime. 30 tablet 1  . triamcinolone cream (KENALOG) 0.1 % Apply 1 application topically 2 (two) times daily. 80 g 1   No current facility-administered medications on file prior to visit.    BP 102/78 mmHg  Pulse 88  Temp(Src) 98.1 F (36.7 C) (Oral)  Ht 5' 3.5" (1.613 m)  Wt 87 lb 6.4 oz (39.644 kg)  BMI 15.24 kg/m2  SpO2 95%       Objective:   Physical Exam  General Mental Status- Alert. General Appearance- Not in acute distress.     Chest and Lung Exam Auscultation: Breath Sounds:-Normal.  Cardiovascular Auscultation:Rythm- Regular. Murmurs & Other Heart Sounds:Auscultation of the heart reveals- No Murmurs.   Neurologic Cranial Nerve exam:- CN III-XII intact(No nystagmus), symmetric smile.       Assessment & Plan:  For your chronic anxiety I did prescribe you your 80 tabs of xanax. You have used for years and won't make changes. For further refills contact Dr. Larose Kells or his MA. Follow up with him as regularly scheduled or as needed

## 2016-06-08 NOTE — Progress Notes (Signed)
Pre visit review using our clinic review tool, if applicable. No additional management support is needed unless otherwise documented below in the visit note. 

## 2016-07-04 ENCOUNTER — Other Ambulatory Visit: Payer: Self-pay | Admitting: Internal Medicine

## 2016-07-05 NOTE — Telephone Encounter (Signed)
done

## 2016-07-07 ENCOUNTER — Ambulatory Visit: Payer: Medicare Other | Admitting: Internal Medicine

## 2016-07-10 ENCOUNTER — Other Ambulatory Visit: Payer: Self-pay | Admitting: Medical

## 2016-07-10 NOTE — Telephone Encounter (Signed)
I saw pt one time and refilled her xanax. I think Dr. Larose Kells was out of town. I was given history that has been on med for years. I gave rx but instructed that pcp would need to fill in future. So please pass this by Dr. Larose Kells and let pt know his decision.

## 2016-07-12 ENCOUNTER — Telehealth: Payer: Self-pay | Admitting: Medical

## 2016-07-12 NOTE — Telephone Encounter (Signed)
Pt is requesting refill on Alprazolam.  Last OV: 06/08/2016 w/ Edward Last Fill: 06/08/2016 #80 and 0RF UDS: 06/24/2015 Low risk  Please advise.

## 2016-07-12 NOTE — Telephone Encounter (Signed)
Okay 80, 2 refills

## 2016-07-12 NOTE — Telephone Encounter (Signed)
Rx printed, awaiting MD signature.  

## 2016-07-12 NOTE — Telephone Encounter (Signed)
[facility]  

## 2016-07-22 ENCOUNTER — Other Ambulatory Visit: Payer: Self-pay | Admitting: Internal Medicine

## 2016-07-22 DIAGNOSIS — J410 Simple chronic bronchitis: Secondary | ICD-10-CM

## 2016-07-26 ENCOUNTER — Ambulatory Visit (INDEPENDENT_AMBULATORY_CARE_PROVIDER_SITE_OTHER): Payer: Medicare Other | Admitting: Medical

## 2016-07-26 ENCOUNTER — Encounter: Payer: Self-pay | Admitting: Medical

## 2016-07-26 VITALS — BP 120/63 | HR 107 | Temp 99.8°F | Ht 63.5 in | Wt 100.0 lb

## 2016-07-26 DIAGNOSIS — G894 Chronic pain syndrome: Secondary | ICD-10-CM

## 2016-07-26 DIAGNOSIS — J209 Acute bronchitis, unspecified: Secondary | ICD-10-CM | POA: Diagnosis not present

## 2016-07-26 DIAGNOSIS — Z8709 Personal history of other diseases of the respiratory system: Secondary | ICD-10-CM

## 2016-07-26 DIAGNOSIS — R062 Wheezing: Secondary | ICD-10-CM | POA: Diagnosis not present

## 2016-07-26 MED ORDER — DOXYCYCLINE HYCLATE 100 MG PO TABS: 100.0000 mg | ORAL_TABLET | Freq: Two times a day (BID) | ORAL | 0 refills | Status: DC

## 2016-07-26 MED ORDER — PREDNISONE 10 MG PO TABS: ORAL_TABLET | ORAL | 0 refills | Status: DC

## 2016-07-26 MED ORDER — HYDROCODONE-HOMATROPINE 5-1.5 MG/5ML PO SYRP: 5.0000 mL | ORAL_SOLUTION | Freq: Three times a day (TID) | ORAL | 0 refills | Status: DC | PRN

## 2016-07-26 NOTE — Patient Instructions (Addendum)
You appear to have bronchitis. Rest hydrate and tylenol for fever. I am prescribing  Hycodan cough medicine(pt clarifies can take no reaction),  and  Doxycycline antibiotic. For your nasal congestion you could use otc the counter nasal steroid.   You should gradually get better. If not then notify us and would recommend a chest xray and cbc  Continue your albuterol inhaler as needed  Follow up in 7-10 days or as needed  Note I tried to convince pt to get cxr and cbc today. Explained the benefit of test and reason wanted to order. She states will get only if worsens.  Marland Kitchen

## 2016-07-26 NOTE — Progress Notes (Signed)
Subjective:    Patient ID: Casey Schmidt, female    DOB: 1956/05/09, 60 y.o.   MRN: [id number]  HPI  Pt in and states coughing since past Friday. She has been coughing up mucous. Using mucinex otc.  Pt has been wheezing. Using albuterol 2-3 times a day.  Pt refused to get cbc. It was close to 5 pm. I asked her to follow me to lab and she declined. She was concerned for cost and thought maybe not necessary at this point.  Mild subjective fever on and off over weekend.  Pt smokes 2 cigarette a day recently.  Pt states symbicort too expensive.  Pt does not want chest xray either. She declines.     Review of Systems  Constitutional: Positive for fever. Negative for chills and fatigue.       Subjective low grade over the weekend.  HENT: Negative for congestion, rhinorrhea, sinus pressure and sneezing.   Respiratory: Positive for cough and wheezing. Negative for chest tightness and shortness of breath.   Cardiovascular: Negative for chest pain and palpitations.  Gastrointestinal: Negative for abdominal pain.  Genitourinary: Negative for dysuria.  Musculoskeletal: Negative for back pain.  Skin: Negative for pallor.  Hematological: Negative for adenopathy. Does not bruise/bleed easily.    Past Medical History:  Diagnosis Date  . Allergy    SEASONAL  . Anal fissure   . Anxiety   . Arthritis   . Cataract    BILATERAL  . COPD (chronic obstructive pulmonary disease) ([place])   . GERD (gastroesophageal reflux disease)   . Hypertension   . Migraines    h/o  . Multiple gastric ulcers in the 70s   d/t BC powders  . Pneumonia   . Right calcaneal fracture 2017   ORIF in 12/2015     Social History   Social History  . Marital status: Widowed    Spouse name: N/A  . Number of children: 2  . Years of education: N/A   Occupational History  . retired-disability d/t anxiety-djd    Social History Main Topics  . Smoking status: Current Every Day Smoker    Packs/day: 0.25      Years: 32.00    Types: Cigarettes  . Smokeless tobacco: Never Used     Comment: smoking again as of 09-2014   . Alcohol use 0.0 oz/week     Comment: rare  . Drug use: No  . Sexual activity: Not on file   Other Topics Concern  . Not on file   Social History Narrative   Lost husband 2014, son lives w/ her, has a boyfriend    Past Surgical History:  Procedure Laterality Date  . ganglion cyst removal Left remotely    from wrist  . ORIF CALCANEAL FRACTURE Right 12/30/2015    Dr. Mardelle Matte  . RIGHT OOPHORECTOMY    . TUBAL LIGATION    . WRIST SURGERY Right 2015   fx    Family History  Problem Relation Age of Onset  . Hypertension Mother   . Emphysema Mother   . Hypertension Father   . Skin cancer Father   . Stroke Father   . Colon cancer Maternal Grandmother   . Breast cancer Neg Hx   . CAD Neg Hx   . Colon polyps Paternal Aunt   . Heart disease Paternal Grandfather     Allergies  Allergen Reactions  . Amoxicillin Other (See Comments)    Blood in stools. Pt states she can't take  any "cillins"  (?? See OV note 06-29-15)  . Aspirin Other (See Comments)    "Burns stomach"  . Clarithromycin     REACTION: GI upset  . Codeine     REACTION: GI upset  . Erythromycin     REACTION: GI upset  . Nsaids     REACTION: GI upset  . Tramadol Other (See Comments)    "Burns stomach"    Current Outpatient Prescriptions on File Prior to Visit  Medication Sig Dispense Refill  . albuterol (PROAIR HFA) 108 (90 Base) MCG/ACT inhaler Inhale 2 puffs into the lungs every 4 (four) hours as needed for wheezing or shortness of breath. 18 g 5  . ALPRAZolam (XANAX) 0.5 MG tablet Take 1 tablet (0.5 mg total) by mouth 4 (four) times daily as needed for anxiety. 80 tablet 2  . budesonide-formoterol (SYMBICORT) 160-4.5 MCG/ACT inhaler Inhale 2 puffs into the lungs 2 (two) times daily. 1 Inhaler 3  . Cholecalciferol (VITAMIN D PO) Take 1 tablet by mouth daily.    Marland Kitchen diltiazem 2 % GEL Apply 1  application topically 3 (three) times daily. 30 g 1  . doxycycline (VIBRA-TABS) 100 MG tablet Take 1 tablet (100 mg total) by mouth 2 (two) times daily. 14 tablet 0  . ENSURE (ENSURE) Take 3 Cans by mouth daily.    . fluticasone (FLONASE) 50 MCG/ACT nasal spray Place 2 sprays into both nostrils daily as needed for allergies or rhinitis. 16 g 3  . methadone (DOLOPHINE) 10 MG/5ML solution Take 55 mg by mouth daily.    . Misc. Devices (FREE SPIRIT KNEE/LEG WALKER) MISC 1 Units by Does not apply route as directed. 1 each 0  . mupirocin cream (BACTROBAN) 2 % Apply 1 application topically 2 (two) times daily. 60 g 1  . ranitidine (ZANTAC) 300 MG tablet Take 1 tablet (300 mg total) by mouth at bedtime. 30 tablet 3  . triamcinolone cream (KENALOG) 0.1 % Apply 1 application topically 2 (two) times daily. 80 g 1  . predniSONE (DELTASONE) 10 MG tablet Take 1 tablet (10 mg total) by mouth daily. 2 tabs a day x 5 days (Patient not taking: Reported on 07/26/2016) 10 tablet 0   No current facility-administered medications on file prior to visit.     BP 120/63   Pulse (!) 107   Temp 99.8 F (37.7 C) (Oral)   Ht 5' 3.5" (1.613 m)   Wt 100 lb (45.4 kg)   SpO2 93%   BMI 17.44 kg/m       Objective:   Physical Exam  General  Mental Status - Alert. General Appearance - Well groomed. Not in acute distress.  Skin Rashes- No Rashes.  HEENT Head- Normal. Ear Auditory Canal - Left- Normal. Right - Normal.Tympanic Membrane- Left- Normal. Right- Normal. Eye Sclera/Conjunctiva- Left- Normal. Right- Normal. Nose & Sinuses Nasal Mucosa- Left-  Boggy and Congested. Right-  Boggy and  Congested.Bilateral maxillary and frontal sinus pressure. Mouth & Throat Lips: Upper Lip- Normal: no dryness, cracking, pallor, cyanosis, or vesicular eruption. Lower Lip-Normal: no dryness, cracking, pallor, cyanosis or vesicular eruption. Buccal Mucosa- Bilateral- No Aphthous ulcers. Oropharynx- No Discharge or  Erythema. Tonsils: Characteristics- Bilateral- No Erythema or Congestion. Size/Enlargement- Bilateral- No enlargement. Discharge- bilateral-None.  Neck Neck- Supple. No Masses.   Chest and Lung Exam Auscultation: Breath Sounds:-even and unlabored.(but very shallow tight respirations)  Cardiovascular Auscultation:Rythm- Regular, rate and rhythm. Murmurs & Other Heart Sounds:Ausculatation of the heart reveal- No Murmurs.  Lymphatic Head &  Neck General Head & Neck Lymphatics: Bilateral: Description- No Localized lymphadenopathy.       Assessment & Plan:  You appear to have bronchitis. Rest hydrate and tylenol for fever. I am prescribing  Hycodan cough medicine, and and  Doxycycline antibiotic. For your nasal congestion you could use otc the counter nasal steroid.   You should gradually get better. If not then notify us and would recommend a chest xray.  Continue your albuterol inhaler as needed  Follow up in 7-10 days or as needed  Note I tried to convince pt to get cxr and cbc today. Explained the benefit of test and reason wanted to order. She states will get only if worsens.  LPN gave her depomedrol  40 mg imas well.She wanted me to go higher and longer on oral prednisone. As compromise I agreed to give depomedrol IM. She was agreeable to this.

## 2016-07-27 DIAGNOSIS — J209 Acute bronchitis, unspecified: Secondary | ICD-10-CM | POA: Diagnosis not present

## 2016-07-27 DIAGNOSIS — G894 Chronic pain syndrome: Secondary | ICD-10-CM | POA: Diagnosis not present

## 2016-07-27 MED ORDER — METHYLPREDNISOLONE ACETATE 40 MG/ML IJ SUSP
40.0000 mg | Freq: Once | INTRAMUSCULAR | Status: AC
  Administered 2016-07-27: 40 mg via INTRAMUSCULAR

## 2016-07-27 NOTE — Addendum Note (Signed)
Addended by: Bunnie Domino on: 07/27/2016 08:54 AM   Modules accepted: Orders

## 2016-07-31 ENCOUNTER — Telehealth: Payer: Self-pay | Admitting: *Deleted

## 2016-07-31 MED ORDER — PREDNISONE 20 MG PO TABS: 20.0000 mg | ORAL_TABLET | Freq: Every day | ORAL | 0 refills | Status: DC

## 2016-07-31 NOTE — Telephone Encounter (Signed)
[facility] note received via fax  Call:   Date: 07/30/16 Time: [id number]   Caller: Self Return number: [phone]  Nurse: Melba Coon, RN   Chief Complaint: Cough  Reason for call: Caller states wants more medication called in for bronchitis. Caller states was instructed by PA to call back if when she got down to 3 to call and he would give her more. Caller is still takin abx doxycycline. Caller states symptoms are getting better today but not as good as caller had expected. RN advised caller to call back in the morning. Caller verbalized understanding.   Related visit to physician within the last 2 weeks: N/A  Guideline: Clinical Call  Disposition: N/A  **Called pt, she states her symptoms are improved, but she still feels like she needs more prednisone. States she still has cough, fatigue, and breathing is not back at baseline. Please advise.**

## 2016-07-31 NOTE — Telephone Encounter (Signed)
I called pt. She tells me she is feeling better. But end of taper prednisone feel needs additional extended prednisone. Some wheezing but less than before. Overall feeling better. She still has doxycycline.  If offered appointment any time this week if she feels need to come in.   Strongly explained if not completely better by Friday then want to see her before the weekend. She declined cbc and cxr on last visit. So I explained my plans would be to get those some time this week to see how she is. Particularly on Friday before weekend as want to avoid her having to be seen over weekend. Pt expressed understanding.

## 2016-08-15 ENCOUNTER — Telehealth: Payer: Self-pay | Admitting: Internal Medicine

## 2016-08-15 DIAGNOSIS — R05 Cough: Secondary | ICD-10-CM

## 2016-08-15 DIAGNOSIS — R059 Cough, unspecified: Secondary | ICD-10-CM

## 2016-08-15 NOTE — Telephone Encounter (Signed)
Please call pt and advise she can be seen tomorrow. Maybe have her schedule in afternoon. Get xray. Have her scheduled for tomorrow in the afternoon around 1 pm. But get xray around noon or little earlier. Order already placed and she can get. But want to actually see her for visit and listen to her lungs as well.

## 2016-08-15 NOTE — Telephone Encounter (Signed)
Caller name: Relationship to patient: Self Can be reached: [phone]  Pharmacy:  Reason for call: Patient called stating that she is not feeling much better and wants to have the chest xray done Thursday. Saw Percell Miller and was informed to call if she was not getting better after taking the antibiotic.

## 2016-08-16 ENCOUNTER — Ambulatory Visit (HOSPITAL_BASED_OUTPATIENT_CLINIC_OR_DEPARTMENT_OTHER)
Admission: RE | Admit: 2016-08-16 | Discharge: 2016-08-16 | Disposition: A | Discharge status: Home / Self Care | Payer: Medicare Other | Source: Ambulatory Visit | Attending: Medical | Admitting: Medical

## 2016-08-16 ENCOUNTER — Ambulatory Visit (INDEPENDENT_AMBULATORY_CARE_PROVIDER_SITE_OTHER): Payer: Medicare Other | Admitting: Medical

## 2016-08-16 ENCOUNTER — Encounter: Payer: Self-pay | Admitting: Medical

## 2016-08-16 VITALS — BP 122/70 | HR 103 | Temp 98.9°F | Ht 63.5 in | Wt 101.8 lb

## 2016-08-16 DIAGNOSIS — J441 Chronic obstructive pulmonary disease with (acute) exacerbation: Secondary | ICD-10-CM | POA: Diagnosis not present

## 2016-08-16 DIAGNOSIS — J449 Chronic obstructive pulmonary disease, unspecified: Secondary | ICD-10-CM | POA: Diagnosis not present

## 2016-08-16 DIAGNOSIS — J189 Pneumonia, unspecified organism: Secondary | ICD-10-CM

## 2016-08-16 DIAGNOSIS — R61 Generalized hyperhidrosis: Secondary | ICD-10-CM

## 2016-08-16 DIAGNOSIS — I700 Atherosclerosis of aorta: Secondary | ICD-10-CM | POA: Diagnosis not present

## 2016-08-16 DIAGNOSIS — R059 Cough, unspecified: Secondary | ICD-10-CM

## 2016-08-16 DIAGNOSIS — R05 Cough: Secondary | ICD-10-CM

## 2016-08-16 DIAGNOSIS — R079 Chest pain, unspecified: Secondary | ICD-10-CM | POA: Diagnosis not present

## 2016-08-16 DIAGNOSIS — R062 Wheezing: Secondary | ICD-10-CM | POA: Diagnosis not present

## 2016-08-16 DIAGNOSIS — R918 Other nonspecific abnormal finding of lung field: Secondary | ICD-10-CM | POA: Diagnosis not present

## 2016-08-16 LAB — CBC WITH DIFFERENTIAL/PLATELET
BASOS ABS: 0.1 10*3/uL (ref 0.0–0.1)
Basophils Relative: 0.4 % (ref 0.0–3.0)
EOS ABS: 0.3 10*3/uL (ref 0.0–0.7)
Eosinophils Relative: 2.1 % (ref 0.0–5.0)
HEMATOCRIT: 37.7 % (ref 36.0–46.0)
HEMOGLOBIN: 12.8 g/dL (ref 12.0–15.0)
LYMPHS PCT: 16.7 % (ref 12.0–46.0)
Lymphs Abs: 2.3 10*3/uL (ref 0.7–4.0)
MCHC: 34.1 g/dL (ref 30.0–36.0)
MCV: 94.2 fl (ref 78.0–100.0)
Monocytes Absolute: 0.7 10*3/uL (ref 0.1–1.0)
Monocytes Relative: 4.8 % (ref 3.0–12.0)
Neutro Abs: 10.5 10*3/uL — ABNORMAL HIGH (ref 1.4–7.7)
Neutrophils Relative %: 76 % (ref 43.0–77.0)
Platelets: 180 10*3/uL (ref 150.0–400.0)
RBC: 4 Mil/uL (ref 3.87–5.11)
RDW: 13.6 % (ref 11.5–15.5)
WBC: 13.9 10*3/uL — AB (ref 4.0–10.5)

## 2016-08-16 MED ORDER — PREDNISONE 10 MG PO TABS: ORAL_TABLET | ORAL | 0 refills | Status: DC

## 2016-08-16 MED ORDER — AZITHROMYCIN 250 MG PO TABS: ORAL_TABLET | ORAL | 0 refills | Status: DC

## 2016-08-16 MED ORDER — BENZONATATE 100 MG PO CAPS: 100.0000 mg | ORAL_CAPSULE | Freq: Three times a day (TID) | ORAL | 0 refills | Status: DC | PRN

## 2016-08-16 MED ORDER — METHYLPREDNISOLONE ACETATE 40 MG/ML IJ SUSP
40.0000 mg | Freq: Once | INTRAMUSCULAR | Status: AC
  Administered 2016-08-16: 40 mg via INTRAMUSCULAR

## 2016-08-16 MED ORDER — IPRATROPIUM-ALBUTEROL 0.5-2.5 (3) MG/3ML IN SOLN
3.0000 mL | Freq: Four times a day (QID) | RESPIRATORY_TRACT | Status: AC
  Administered 2016-08-16: 3 mL via RESPIRATORY_TRACT

## 2016-08-16 MED ORDER — PREDNISONE 10 MG PO TABS: 10.0000 mg | ORAL_TABLET | Freq: Every day | ORAL | 0 refills | Status: DC

## 2016-08-16 MED ORDER — FLUTICASONE-SALMETEROL 250-50 MCG/DOSE IN AEPB: 1.0000 | INHALATION_SPRAY | Freq: Two times a day (BID) | RESPIRATORY_TRACT | 0 refills | Status: AC

## 2016-08-16 MED ORDER — CEFDINIR 300 MG PO CAPS: 300.0000 mg | ORAL_CAPSULE | Freq: Two times a day (BID) | ORAL | 0 refills | Status: DC

## 2016-08-16 NOTE — Progress Notes (Signed)
Subjective:    Patient ID: Casey Schmidt, female    DOB: Dec 21, 1955, 60 y.o.   MRN: [id number]  HPI  Pt in for follow up. Pt states she is not getting over this illness completely. She states she has some chest congestion and some wheezing recently. But some  wheeze occurs when cough. Lying flat on her back she will wheeze and feel some sob as well. She states needs to prop herself against pillow at times.  Pt is now smoking 2 cigarettes a day. Pt has been cutting back for some time. Pt used to smoke pack a day 3-4 months ago. Pt started smoking at 60 yo.  Pt in past states symbicort was too expensive.   On 07-26-2016 pt declined both cbc and cxr.   That day I rx'd doxycycline, albuterol, and tapered prednisone.(called [facility] into [facility] on [address]).  I talked with pt on 07-31-2016 she was feeling some better(but not completely) and I  extended course of prednisone at her request. Pt The followed up today. She reports better overall but not completely. Some sweating at night.      Review of Systems  Constitutional: Positive for diaphoresis and fatigue. Negative for chills and fever.  HENT: Negative for congestion, ear discharge, ear pain, facial swelling, postnasal drip, rhinorrhea and sinus pressure.   Respiratory: Positive for cough and wheezing. Negative for chest tightness and shortness of breath.   Cardiovascular: Negative for chest pain and palpitations.  Gastrointestinal: Negative for abdominal pain, constipation, diarrhea, nausea and vomiting.  Musculoskeletal: Negative for back pain.  Skin: Negative for rash.  Neurological: Negative for dizziness and headaches.  Hematological: Negative for adenopathy. Does not bruise/bleed easily.  Psychiatric/Behavioral: Negative for behavioral problems and confusion.   Past Medical History:  Diagnosis Date  . Allergy    SEASONAL  . Anal fissure   . Anxiety   . Arthritis   . Cataract    BILATERAL  . COPD (chronic  obstructive pulmonary disease) ([place])   . GERD (gastroesophageal reflux disease)   . Hypertension   . Migraines    h/o  . Multiple gastric ulcers in the 70s   d/t BC powders  . Pneumonia   . Right calcaneal fracture 2017   ORIF in 12/2015     Social History   Social History  . Marital status: Widowed    Spouse name: N/A  . Number of children: 2  . Years of education: N/A   Occupational History  . retired-disability d/t anxiety-djd    Social History Main Topics  . Smoking status: Current Every Day Smoker    Packs/day: 0.25    Years: 32.00    Types: Cigarettes  . Smokeless tobacco: Never Used     Comment: smoking again as of 09-2014   . Alcohol use 0.0 oz/week     Comment: rare  . Drug use: No  . Sexual activity: Not on file   Other Topics Concern  . Not on file   Social History Narrative   Lost husband 2014, son lives w/ her, has a boyfriend    Past Surgical History:  Procedure Laterality Date  . ganglion cyst removal Left remotely    from wrist  . ORIF CALCANEAL FRACTURE Right 12/30/2015    Dr. Mardelle Matte  . RIGHT OOPHORECTOMY    . TUBAL LIGATION    . WRIST SURGERY Right 2015   fx    Family History  Problem Relation Age of Onset  . Hypertension Mother   .  Emphysema Mother   . Hypertension Father   . Skin cancer Father   . Stroke Father   . Colon cancer Maternal Grandmother   . Breast cancer Neg Hx   . CAD Neg Hx   . Colon polyps Paternal Aunt   . Heart disease Paternal Grandfather     Allergies  Allergen Reactions  . Amoxicillin Other (See Comments)    Blood in stools. Pt states she can't take any "cillins"  (?? See OV note 06-29-15)  . Aspirin Other (See Comments)    "Burns stomach"  . Clarithromycin     REACTION: GI upset  . Codeine     REACTION: GI upset  . Erythromycin     REACTION: GI upset  . Nsaids     REACTION: GI upset  . Tramadol Other (See Comments)    "Burns stomach"    Current Outpatient Prescriptions on File Prior to Visit    Medication Sig Dispense Refill  . albuterol (PROAIR HFA) 108 (90 Base) MCG/ACT inhaler Inhale 2 puffs into the lungs every 4 (four) hours as needed for wheezing or shortness of breath. 18 g 5  . ALPRAZolam (XANAX) 0.5 MG tablet Take 1 tablet (0.5 mg total) by mouth 4 (four) times daily as needed for anxiety. 80 tablet 2  . Cholecalciferol (VITAMIN D PO) Take 1 tablet by mouth daily.    Marland Kitchen diltiazem 2 % GEL Apply 1 application topically 3 (three) times daily. 30 g 1  . ENSURE (ENSURE) Take 3 Cans by mouth daily.    . methadone (DOLOPHINE) 10 MG/5ML solution Take 55 mg by mouth daily.    . ranitidine (ZANTAC) 300 MG tablet Take 1 tablet (300 mg total) by mouth at bedtime. 30 tablet 3   No current facility-administered medications on file prior to visit.     BP 122/70 Comment: Patient moving a lot  Pulse (!) 103   Temp 98.9 F (37.2 C) (Oral)   Ht 5' 3.5" (1.613 m)   Wt 101 lb 12.8 oz (46.2 kg)   SpO2 (!) 88%   BMI 17.75 kg/m       Objective:   Physical Exam  General  Mental Status - Alert. General Appearance - Well groomed. Not in acute distress.  Skin Rashes- No Rashes.  HEENT Head- Normal. Ear Auditory Canal - Left- Normal. Right - Normal.Tympanic Membrane- Left- Normal. Right- Normal. Eye Sclera/Conjunctiva- Left- Normal. Right- Normal. Nose & Sinuses Nasal Mucosa- Left-  Boggy and Congested. Right-  Boggy and  Congested.Bilateral maxillary and frontal sinus pressure. Mouth & Throat Lips: Upper Lip- Normal: no dryness, cracking, pallor, cyanosis, or vesicular eruption. Lower Lip-Normal: no dryness, cracking, pallor, cyanosis or vesicular eruption. Buccal Mucosa- Bilateral- No Aphthous ulcers. Oropharynx- No Discharge or Erythema. Tonsils: Characteristics- Bilateral- No Erythema or Congestion. Size/Enlargement- Bilateral- No enlargement. Discharge- bilateral-None.  Neck Neck- Supple. No Masses.   Chest and Lung Exam Auscultation: Breath Sounds:-shallow and tight.  On deep inspiration coarse upper lobe breath sounds bilaterally.  Cardiovascular Auscultation:Rythm- Regular, rate and rhythm. Murmurs & Other Heart Sounds:Ausculatation of the heart reveal- No Murmurs.  Lymphatic Head & Neck General Head & Neck Lymphatics: Bilateral: Description- No Localized lymphadenopathy.       Assessment & Plan:  For possible early infiltrate by cxr will rx cefdnir and azithromycin Considered levofloxin but warning significant q t prolongation with methadone. So chose cerfnir and azithromycin(counseled on her side effect hx but does not sound like allergic reaction.) Note I discucssed with pharmacist in  our building and she though zpack safer than levofloxin. Also IV form antibiotic typically is more concern) Pt ekg in our chart shows sinus rhythm in past.  cxr done today. Repeat in 7-10 days.  Will get cbc today.   Neb duoneb treatment in office today. Depomedrol im 40 mg today. prednisone low dose for 5 days.Also asking staff to get me [facility] number so I can arrange pt to have duoneb solution with machine available to use by this Friday. Machine and duoneb solution ordered for copd. Gave filled form for Santiago Glad LPN to fax over.  Rx advair today. Rx albuterol inhaler.  Considering referral to pulmonologist. Pt declining presently but will go if her symptoms persists despite above measures.  Follow up in 7 days or as needed

## 2016-08-16 NOTE — Telephone Encounter (Signed)
Their # is [phone]. They also have an order form I will give to Santiago Glad as well.

## 2016-08-16 NOTE — Telephone Encounter (Signed)
Faxed documents to [place] for patients Nebulizer medications.

## 2016-08-16 NOTE — Progress Notes (Signed)
Pre visit review using our clinic tool,if applicable. No additional management support is needed unless otherwise documented below in the visit note.  

## 2016-08-16 NOTE — Telephone Encounter (Signed)
Patient agreed to come in today at 1 and have XR done before appointment.

## 2016-08-16 NOTE — Telephone Encounter (Signed)
Will you give me number to [facility]. I want to talk with them about getting pt a neb machine with duo neb solutions. Would like her to have before weekend. So could you get me their number.

## 2016-08-16 NOTE — Patient Instructions (Addendum)
For possible early infiltrate by cxr will rx cefdnir and azithromycin. Considered levofloxin but warning significant q t prolongation with methadone. So chose cerfnir and azithromycin(counseled on her side effect hx but does not sound like allergic reaction.)  cxr done today. Repeat in 7-10 days.  Will get cbc today.   Neb duoneb treatment in office today. Depomedrol im 40 mg today. Also prednisone low dose for 5 days. Also asking staff to get me [facility] number so I can arrange pt to have duoneb solution with machine available to use by this Friday.Machine and duoneb solution ordered for copd.   Rx advair today. Rx albuterol inhaler.  Considering referral to pulmonologist. Pt declining presently but will go if her symptoms persists despite above measures.  Follow up in 7 days or as needed

## 2016-08-17 ENCOUNTER — Telehealth: Payer: Self-pay | Admitting: Internal Medicine

## 2016-08-17 LAB — COMPREHENSIVE METABOLIC PANEL
ALT: 13 U/L (ref 0–35)
AST: 32 U/L (ref 0–37)
Albumin: 3.9 g/dL (ref 3.5–5.2)
Alkaline Phosphatase: 115 U/L (ref 39–117)
BUN: 6 mg/dL (ref 6–23)
CHLORIDE: 94 meq/L — AB (ref 96–112)
CO2: 29 mEq/L (ref 19–32)
Calcium: 8.9 mg/dL (ref 8.4–10.5)
Creatinine, Ser: 0.94 mg/dL (ref 0.40–1.20)
GFR: 64.83 mL/min (ref >60.00)
GLUCOSE: 86 mg/dL (ref 70–99)
POTASSIUM: 3.8 meq/L (ref 3.5–5.1)
SODIUM: 131 meq/L — AB (ref 135–145)
Total Bilirubin: 0.3 mg/dL (ref 0.2–1.2)
Total Protein: 7.6 g/dL (ref 6.0–8.3)

## 2016-08-17 NOTE — Telephone Encounter (Signed)
Patient saw Percell Miller yesterday and was prescribed a few different medications. She said she got what she could but the cefdinir (OMNICEF) 300 MG capsule was too expensive and she would like to know if there is another medication that we could call in that is a little cheaper for her? Please advise.   Patient Relation: Self Patient [id number] or [phone]

## 2016-08-17 NOTE — Telephone Encounter (Signed)
Pt seen by Percell Miller yesterday. Forwarding.

## 2016-08-17 NOTE — Telephone Encounter (Signed)
Caller name: Bethanne Ginger Relationship to patient: Ace Gins Can be reached: [phone] Pharmacy:  Reason for call: [facility] needs a new Rx for Nebulizer and supplies. Need it to be Every 6 hour and PRN. Also need printed name of provider. Fax number: [phone]

## 2016-08-17 NOTE — Telephone Encounter (Signed)
Faxed order with documentation for Nebulizer and supplies to [facility].

## 2016-08-18 MED ORDER — CEPHALEXIN 500 MG PO CAPS: 500.0000 mg | ORAL_CAPSULE | Freq: Two times a day (BID) | ORAL | 0 refills | Status: DC

## 2016-08-18 NOTE — Telephone Encounter (Signed)
Rx cephalexin since cefdnir too expensive. How is pt doing. Keflex similar and should be less expensive. How is she? Will you call and check on her. Did she get neb machine yet? Offer follow up on Monday?

## 2016-08-18 NOTE — Telephone Encounter (Signed)
Called patient back. Left message for return call.

## 2016-08-18 NOTE — Telephone Encounter (Signed)
What is hold up from [place]. Will you investigate

## 2016-08-18 NOTE — Telephone Encounter (Signed)
Stated they sent back Certificate of [place]. They need Frequency to state (and as needed) . Syou ays the Rx is outdated needs new RX. Needs all of Nebulized supplies checked and prescribers printed name. Maybe you just need to completed another one. States they sent it back but I don't see it. Looked through the incoming correspondence.

## 2016-08-18 NOTE — Telephone Encounter (Signed)
Pt called back in to follow up on request. She says that medication is just to expensive. She would like a call back to discuss further.

## 2016-08-18 NOTE — Telephone Encounter (Signed)
Patient called back. States she is better but far from normal. States she has not giotten he Neb. Machine yet.

## 2016-08-21 ENCOUNTER — Telehealth: Payer: Self-pay

## 2016-08-21 NOTE — Telephone Encounter (Signed)
Received Confirmation order form for Nebulizer and supplies. Document signed and sent back to [place] a copy to [place].

## 2016-08-21 NOTE — Telephone Encounter (Signed)
Paper work for [employer] filled again. And I signed order for med for neb and papers stating copd diagnosis. Gave to karen to fax.

## 2016-08-22 DIAGNOSIS — J449 Chronic obstructive pulmonary disease, unspecified: Secondary | ICD-10-CM | POA: Diagnosis not present

## 2016-08-23 ENCOUNTER — Telehealth: Payer: Self-pay | Admitting: Internal Medicine

## 2016-08-23 NOTE — Telephone Encounter (Signed)
Appointment very important on this  Friday since she is being treated for probable pneumonia and reporting diarrhea issue. So please have her schedule.

## 2016-08-23 NOTE — Telephone Encounter (Signed)
Pharmacy is calling to let us know that patients ipratropium-albuterol (DUONEB) 0.5-2.5 (3) MG/3ML nebulizer solution 3 mL is not covered by insurance because it says "AS NEEDED" Pharmacy is wondering if we can drop the as needed so that it can be covered. Please advise.    Pharmacy:[facility] Contact: [phone] ext. [phone]

## 2016-08-23 NOTE — Telephone Encounter (Signed)
Pt seen by Percell Miller last week, forwarding.

## 2016-08-23 NOTE — Telephone Encounter (Signed)
Can you call [facility] at the pharmacy. See number and explain can drop prn. See if med covered. Also will you call pt and see if she is still having diarrhea. She could stop keflex temporarily. But advise follow up this Friday. Need to listen to her lungs. Also if still has diarrhea then she will need to do stool panel kit to check for c dif. Appoitment Friday very imprortant.

## 2016-08-23 NOTE — Telephone Encounter (Signed)
Pt called in to follow up. She says that she received 2 antibiotics. She says that CephALEXin is causing nausea and diarrhea. Pt would like to be advised on if she should continue taking medication?    CB: [phone]

## 2016-08-24 NOTE — Telephone Encounter (Signed)
Called to follow up with patient.  Pt states she has decreased her Keflex dose to one capsule a day and is no longer experiencing nausea and diarrhea.  However, she c/o feeling really weak and tired.  Continues to experience a little cough and congestion.  She is agreeable to coming in tomorrow to follow up with Percell Miller.  Appt scheduled for 08/25/16 @ 1:30pm.    Pt wants to know if it's okay to continue taking one capsule of Keflex rather than stopping medication?  Please advise.

## 2016-08-25 ENCOUNTER — Telehealth: Payer: Self-pay | Admitting: Internal Medicine

## 2016-08-25 ENCOUNTER — Ambulatory Visit (INDEPENDENT_AMBULATORY_CARE_PROVIDER_SITE_OTHER): Payer: Medicare Other | Admitting: Medical

## 2016-08-25 ENCOUNTER — Ambulatory Visit (HOSPITAL_BASED_OUTPATIENT_CLINIC_OR_DEPARTMENT_OTHER)
Admission: RE | Admit: 2016-08-25 | Discharge: 2016-08-25 | Disposition: A | Discharge status: Home / Self Care | Payer: Medicare Other | Source: Ambulatory Visit | Attending: Medical | Admitting: Medical

## 2016-08-25 VITALS — BP 120/98 | HR 100 | Temp 99.0°F | Ht 63.5 in | Wt 102.0 lb

## 2016-08-25 DIAGNOSIS — R05 Cough: Secondary | ICD-10-CM | POA: Diagnosis not present

## 2016-08-25 DIAGNOSIS — D72829 Elevated white blood cell count, unspecified: Secondary | ICD-10-CM

## 2016-08-25 DIAGNOSIS — I700 Atherosclerosis of aorta: Secondary | ICD-10-CM | POA: Diagnosis not present

## 2016-08-25 DIAGNOSIS — Z8701 Personal history of pneumonia (recurrent): Secondary | ICD-10-CM | POA: Insufficient documentation

## 2016-08-25 DIAGNOSIS — J449 Chronic obstructive pulmonary disease, unspecified: Secondary | ICD-10-CM | POA: Diagnosis not present

## 2016-08-25 DIAGNOSIS — R5383 Other fatigue: Secondary | ICD-10-CM

## 2016-08-25 DIAGNOSIS — R062 Wheezing: Secondary | ICD-10-CM | POA: Diagnosis not present

## 2016-08-25 DIAGNOSIS — R918 Other nonspecific abnormal finding of lung field: Secondary | ICD-10-CM | POA: Insufficient documentation

## 2016-08-25 DIAGNOSIS — J441 Chronic obstructive pulmonary disease with (acute) exacerbation: Secondary | ICD-10-CM

## 2016-08-25 DIAGNOSIS — J189 Pneumonia, unspecified organism: Secondary | ICD-10-CM

## 2016-08-25 LAB — CBC WITH DIFFERENTIAL/PLATELET
Basophils Absolute: 0 cells/uL (ref 0–200)
Basophils Relative: 0 %
EOS PCT: 2 %
Eosinophils Absolute: 270 cells/uL (ref 15–500)
HEMATOCRIT: 42.7 % (ref 35.0–45.0)
Hemoglobin: 14.3 g/dL (ref 11.7–15.5)
LYMPHS ABS: 4050 {cells}/uL — AB (ref 850–3900)
LYMPHS PCT: 30 %
MCH: 32.4 pg (ref 27.0–33.0)
MCHC: 33.5 g/dL (ref 32.0–36.0)
MCV: 96.8 fL (ref 80.0–100.0)
MONO ABS: 675 {cells}/uL (ref 200–950)
MONOS PCT: 5 %
MPV: 8.5 fL (ref 7.5–12.5)
NEUTROS ABS: 8505 {cells}/uL — AB (ref 1500–7800)
NEUTROS PCT: 63 %
PLATELETS: 290 10*3/uL (ref 140–400)
RBC: 4.41 MIL/uL (ref 3.80–5.10)
RDW: 13.6 % (ref 11.0–15.0)
WBC: 13.5 10*3/uL — AB (ref 3.8–10.8)

## 2016-08-25 LAB — COMPREHENSIVE METABOLIC PANEL
ALT: 14 U/L (ref 6–29)
AST: 18 U/L (ref 10–35)
Albumin: 3.9 g/dL (ref 3.6–5.1)
Alkaline Phosphatase: 105 U/L (ref 33–130)
BILIRUBIN TOTAL: 0.2 mg/dL (ref 0.2–1.2)
BUN: 13 mg/dL (ref 7–25)
CALCIUM: 8.8 mg/dL (ref 8.6–10.4)
CO2: 29 mmol/L (ref 20–31)
Chloride: 96 mmol/L — ABNORMAL LOW (ref 98–110)
Creat: 0.93 mg/dL (ref 0.50–1.05)
GLUCOSE: 72 mg/dL (ref 65–99)
Potassium: 4.6 mmol/L (ref 3.5–5.3)
Sodium: 134 mmol/L — ABNORMAL LOW (ref 135–146)
Total Protein: 6.5 g/dL (ref 6.1–8.1)

## 2016-08-25 MED ORDER — METHYLPREDNISOLONE ACETATE 40 MG/ML IJ SUSP
40.0000 mg | Freq: Once | INTRAMUSCULAR | Status: AC
  Administered 2016-08-25: 40 mg via INTRAMUSCULAR

## 2016-08-25 MED ORDER — CEFTRIAXONE SODIUM 500 MG IJ SOLR
500.0000 mg | Freq: Once | INTRAMUSCULAR | Status: AC
  Administered 2016-08-25: 500 mg via INTRAMUSCULAR

## 2016-08-25 MED ORDER — PREDNISONE 10 MG PO TABS: ORAL_TABLET | ORAL | 0 refills | Status: DC

## 2016-08-25 NOTE — Patient Instructions (Addendum)
Xray showed patchy atelectasis or early infiltrate peripherally in the left midlung. Underlying COPD. Treatment options limited due to you allergy history and medicine interactions. Since you are only able to tolerate 1 cephalexin a day will give rocephin 500 mg im in office today.(you have finished azithromycin)  For you wheezing and copd we will give you another depomedrol 40 mg im. Please get the advair filled. [facility] advised [place] that your medication was delivered with your machine.  I want you to get cbc today and chest xray today to see if interval clearing.(,may need to add back doxycycline)  I think best to go ahead and refer you to pulmonlogist since you recent illness has been quite prolonged.  Also if not better by Monday may try to arrange appointment with your pcp Casey Schmidt.  Follow up in 3 days or as needed

## 2016-08-25 NOTE — Progress Notes (Signed)
Pre visit review using our clinic tool,if applicable. No additional management support is needed unless otherwise documented below in the visit note.  

## 2016-08-25 NOTE — Telephone Encounter (Signed)
Patient calling back in to give you the number for the medication for her breathing machine [phone] said she did not know the name of the medication but you would. Can be reached at [phone]

## 2016-08-25 NOTE — Telephone Encounter (Signed)
Patient in today to see E. Saguier PA-C.

## 2016-08-25 NOTE — Telephone Encounter (Signed)
Pt seen by Mackie Pai, PA-C today (08/25/16).

## 2016-08-25 NOTE — Progress Notes (Signed)
Subjective:    Patient ID: Casey Schmidt, female    DOB: 1956-02-17, 60 y.o.   MRN: [id number]  HPI  Pt in for follow up. She states she still feels weak. She states she feels better than she was. She is not coughing as much and not having as much wheezing. Pt states she has not gotten the advair just yet.   Pt is coughing up less mucous.  Pt has been on azithromycin and keflex. Cefdnir was too expensive.Pt got cephalexin and she states could only take one time a day. Caused her to have loose stools. She is only taking cephalexin one tab a day and she does not have loose stools since decreasing dose frequency.  Pt wheezing is improved. She is using albuterol 3-4 times a day. She did not get advair filled.    Review of Systems  Constitutional: Positive for fatigue. Negative for chills and fever.  HENT: Negative for congestion, ear pain, sinus pressure, sneezing and sore throat.   Respiratory: Positive for cough, shortness of breath and wheezing. Negative for apnea and stridor.   Cardiovascular: Negative for chest pain and palpitations.  Gastrointestinal: Negative for abdominal pain.  Neurological: Negative for dizziness and headaches.  Hematological: Negative for adenopathy. Does not bruise/bleed easily.  Psychiatric/Behavioral: Negative for behavioral problems and confusion.   Past Medical History:  Diagnosis Date  . Allergy    SEASONAL  . Anal fissure   . Anxiety   . Arthritis   . Cataract    BILATERAL  . COPD (chronic obstructive pulmonary disease) ([place])   . GERD (gastroesophageal reflux disease)   . Hypertension   . Migraines    h/o  . Multiple gastric ulcers in the 70s   d/t BC powders  . Pneumonia   . Right calcaneal fracture 2017   ORIF in 12/2015     Social History   Social History  . Marital status: Widowed    Spouse name: N/A  . Number of children: 2  . Years of education: N/A   Occupational History  . retired-disability d/t anxiety-djd     Social History Main Topics  . Smoking status: Current Every Day Smoker    Packs/day: 0.25    Years: 32.00    Types: Cigarettes  . Smokeless tobacco: Never Used     Comment: smoking again as of 09-2014   . Alcohol use 0.0 oz/week     Comment: rare  . Drug use: No  . Sexual activity: Not on file   Other Topics Concern  . Not on file   Social History Narrative   Lost husband 2014, son lives w/ her, has a boyfriend    Past Surgical History:  Procedure Laterality Date  . ganglion cyst removal Left remotely    from wrist  . ORIF CALCANEAL FRACTURE Right 12/30/2015    Dr. Mardelle Matte  . RIGHT OOPHORECTOMY    . TUBAL LIGATION    . WRIST SURGERY Right 2015   fx    Family History  Problem Relation Age of Onset  . Hypertension Mother   . Emphysema Mother   . Hypertension Father   . Skin cancer Father   . Stroke Father   . Colon cancer Maternal Grandmother   . Breast cancer Neg Hx   . CAD Neg Hx   . Colon polyps Paternal Aunt   . Heart disease Paternal Grandfather     Allergies  Allergen Reactions  . Amoxicillin Other (See Comments)  Blood in stools. Pt states she can't take any "cillins"  (?? See OV note 06-29-15)  . Aspirin Other (See Comments)    "Burns stomach"  . Clarithromycin     REACTION: GI upset  . Codeine     REACTION: GI upset  . Erythromycin     REACTION: GI upset  . Nsaids     REACTION: GI upset  . Tramadol Other (See Comments)    "Burns stomach"    Current Outpatient Prescriptions on File Prior to Visit  Medication Sig Dispense Refill  . albuterol (PROAIR HFA) 108 (90 Base) MCG/ACT inhaler Inhale 2 puffs into the lungs every 4 (four) hours as needed for wheezing or shortness of breath. 18 g 5  . ALPRAZolam (XANAX) 0.5 MG tablet Take 1 tablet (0.5 mg total) by mouth 4 (four) times daily as needed for anxiety. 80 tablet 2  . azithromycin (ZITHROMAX) 250 MG tablet Take 2 tablets by mouth on day 1, followed by 1 tablet by mouth daily for 4 days. 6 tablet  0  . benzonatate (TESSALON) 100 MG capsule Take 1 capsule (100 mg total) by mouth 3 (three) times daily as needed for cough. 20 capsule 0  . cephALEXin (KEFLEX) 500 MG capsule Take 1 capsule (500 mg total) by mouth 2 (two) times daily. 20 capsule 0  . Cholecalciferol (VITAMIN D PO) Take 1 tablet by mouth daily.    Marland Kitchen diltiazem 2 % GEL Apply 1 application topically 3 (three) times daily. 30 g 1  . ENSURE (ENSURE) Take 3 Cans by mouth daily.    . Fluticasone-Salmeterol (ADVAIR) 250-50 MCG/DOSE AEPB Inhale 1 puff into the lungs 2 (two) times daily. 60 each 0  . methadone (DOLOPHINE) 10 MG/5ML solution Take 55 mg by mouth daily.    . ranitidine (ZANTAC) 300 MG tablet Take 1 tablet (300 mg total) by mouth at bedtime. 30 tablet 3   Current Facility-Administered Medications on File Prior to Visit  Medication Dose Route Frequency Provider Last Rate Last Dose  . ipratropium-albuterol (DUONEB) 0.5-2.5 (3) MG/3ML nebulizer solution 3 mL  3 mL Nebulization Q6H Keath Matera, PA-C   3 mL at 08/16/16 1520    BP (!) 120/98   Pulse 100   Temp 99 F (37.2 C) (Oral)   Ht 5' 3.5" (1.613 m)   Wt 102 lb (46.3 kg)   SpO2 95%   BMI 17.79 kg/m       Objective:   Physical Exam  General  Mental Status - Alert. General Appearance - Well groomed. Not in acute distress.  Skin Rashes- No Rashes.  HEENT Head- Normal. Ear Auditory Canal - Left- Normal. Right - Normal.Tympanic Membrane- Left- Normal. Right- Normal. Eye Sclera/Conjunctiva- Left- Normal. Right- Normal. Nose & Sinuses Nasal Mucosa- Left-  Boggy and Congested. Right-  Boggy and  Congested.Bilateral  No maxillary and no  frontal sinus pressure. Mouth & Throat Lips: Upper Lip- Normal: no dryness, cracking, pallor, cyanosis, or vesicular eruption. Lower Lip-Normal: no dryness, cracking, pallor, cyanosis or vesicular eruption. Buccal Mucosa- Bilateral- No Aphthous ulcers. Oropharynx- No Discharge or Erythema. Tonsils: Characteristics-  Bilateral- No Erythema or Congestion. Size/Enlargement- Bilateral- No enlargement. Discharge- bilateral-None.  Neck Neck- Supple. No Masses.   Chest and Lung Exam Auscultation: Breath Sounds:-deeper and clearer but still some coarse breath sounds  Cardiovascular Auscultation:Rythm- Regular, rate and rhythm. Murmurs & Other Heart Sounds:Ausculatation of the heart reveal- No Murmurs.  Lymphatic Head & Neck General Head & Neck Lymphatics: Bilateral: Description- No Localized lymphadenopathy.  Assessment & Plan:  Xray showed patchy atelectasis or early infiltrate peripherally in the left midlung. Underlying COPD. Treatment options limited due to you allergy history and medicine interactions. Since you are only able to tolerate 1 cephalexin a day will give rocephin 500 mg im in office today.(you have finished azithromycin)  For you wheezing and copd we will give you another depomedrol 40 mg im. Please get the advair filled. [facility] advised [place] that your medication was delivered with your machine.  I want you to get cbc today and chest xray today to see if interval clearing.(,may need to add back doxycycline)  I think best to go ahead and refer you to pulmonlogist since you recent illness has been quite prolonged.  Also if not better by Monday may try to arrange appointment with your pcp Dr. Larose Kells.  Follow up in 3 days or as needed.   Natale Barba, Percell Miller, PA-C

## 2016-08-25 NOTE — Telephone Encounter (Signed)
Will you call numbers patient left me on late Friday. Ask them status of duoneb solution rx. Pt states she has not gotten med yet but has machine.

## 2016-08-29 NOTE — Telephone Encounter (Signed)
Will you ask pt since she won't see pulmonlogist if she would be willing to see Dr. Larose Kells. Then coordinate with [facility]. Explaining since I saw her 3 times in past month wanted her to follow up with Dr. Larose Kells. Pt currently wants to delay pulmonologist referral.

## 2016-09-06 ENCOUNTER — Telehealth: Payer: Self-pay

## 2016-09-06 NOTE — Telephone Encounter (Signed)
Patient states she does feel better and does not want to be seen by a provider right now. States all she wants is for her medication for her breathing machine to be delivered. Came out 2 times to delivery and stated she was not home but patient says she was. States she will come in if she starts to feel bad.

## 2016-09-06 NOTE — Telephone Encounter (Signed)
Called [facility] for medication delivery of patients Duoneb. States they will cal before delivering medicine. 2 attempts made in the past for delivery.

## 2016-09-06 NOTE — Telephone Encounter (Signed)
Called [facility],ordered Nebulizer medication delivery. States they will call patient before delivery due to 2 previous unsuccesfull delivery attempts.

## 2016-09-22 DIAGNOSIS — J449 Chronic obstructive pulmonary disease, unspecified: Secondary | ICD-10-CM | POA: Diagnosis not present

## 2016-09-29 ENCOUNTER — Telehealth: Payer: Self-pay | Admitting: Internal Medicine

## 2016-09-29 NOTE — Telephone Encounter (Signed)
I did not call patient.

## 2016-09-29 NOTE — Telephone Encounter (Signed)
Caller name:Riannon Attaway Relationship to patient: Can be [phone] Pharmacy:  Reason for call:Returning call, no message was left.

## 2016-09-29 NOTE — Telephone Encounter (Signed)
Did you try to call Pt regarding Pain Clinic appt?

## 2016-10-09 ENCOUNTER — Telehealth: Payer: Self-pay | Admitting: Internal Medicine

## 2016-10-10 ENCOUNTER — Telehealth: Payer: Self-pay | Admitting: Internal Medicine

## 2016-10-10 MED ORDER — ALPRAZOLAM 0.5 MG PO TABS: 0.5000 mg | ORAL_TABLET | Freq: Four times a day (QID) | ORAL | 0 refills | Status: DC | PRN

## 2016-10-10 NOTE — Addendum Note (Signed)
Addended byDamita Dunnings D on: 10/10/2016 10:04 AM   Modules accepted: Orders

## 2016-10-10 NOTE — Telephone Encounter (Signed)
Called pt. Made her aware. Pt scheduled her appt for 10/17/16 at 2:15p.

## 2016-10-10 NOTE — Telephone Encounter (Signed)
Okay #30, no RF,  needs office visit.

## 2016-10-10 NOTE — Telephone Encounter (Signed)
Error

## 2016-10-10 NOTE — Telephone Encounter (Signed)
Noted  

## 2016-10-10 NOTE — Telephone Encounter (Signed)
Rx printed, awaiting MD signature.  

## 2016-10-10 NOTE — Telephone Encounter (Signed)
Rx faxed to [facility]. Please call Pt, 30 tablets given, however, no further refills can be given until Pt has seen PCP. Last OV w/ Dr. Larose Kells was 11/2015.

## 2016-10-10 NOTE — Telephone Encounter (Signed)
Pt is requesting refill on Alprazolam.  Last OV w/ PCP: 11/23/2015. Has seen Percell Miller 07/26/2016, 08/16/2016, and 08/25/2016 Last Fill: 07/12/2016 #80 and 2RF Pt sig: 1 tab qid PRN UDS: 06/24/2015 Low risk  Please advise.

## 2016-10-10 NOTE — Telephone Encounter (Signed)
Pt called in to follow up on refill request. Advised that it was denied due to a visit being needed. Pt says that now isn't a good time. She doesn't have the time. Pt would like to know if provider could send in one refill until next month when she is able to come in?    Please advise.

## 2016-10-17 ENCOUNTER — Ambulatory Visit: Payer: Medicare Other | Admitting: Internal Medicine

## 2016-10-22 DIAGNOSIS — J449 Chronic obstructive pulmonary disease, unspecified: Secondary | ICD-10-CM | POA: Diagnosis not present

## 2016-10-23 ENCOUNTER — Ambulatory Visit: Payer: Medicare Other | Admitting: Internal Medicine

## 2016-10-23 DIAGNOSIS — Z0289 Encounter for other administrative examinations: Secondary | ICD-10-CM

## 2016-10-24 ENCOUNTER — Encounter: Payer: Self-pay | Admitting: Internal Medicine

## 2016-10-24 ENCOUNTER — Telehealth: Payer: Self-pay

## 2016-10-24 NOTE — Telephone Encounter (Signed)
Form and letter signed and forwarded to Martinique Johnson, Engineer, building services.

## 2016-10-24 NOTE — Telephone Encounter (Signed)
Cancelled 10/17/2016 and no showed 10/23/2016 after controlled substance refill given. Pt was aware that she must keep appt provider to further refills.

## 2016-10-24 NOTE — Telephone Encounter (Signed)
Please  start the dismissal process

## 2016-10-24 NOTE — Telephone Encounter (Signed)
Dismissal letter and [place] dismissal form completed, awaiting MD signature.

## 2016-10-30 ENCOUNTER — Telehealth: Payer: Self-pay | Admitting: Internal Medicine

## 2016-10-30 NOTE — Telephone Encounter (Signed)
Patient dismissed from [facility] by Kathlene November MD , effective October 24, 2016. Dismissal letter sent out by certified / registered mail.  DAJ

## 2016-10-31 ENCOUNTER — Encounter: Payer: Self-pay | Admitting: Internal Medicine

## 2016-10-31 ENCOUNTER — Ambulatory Visit (INDEPENDENT_AMBULATORY_CARE_PROVIDER_SITE_OTHER): Payer: Medicare Other | Admitting: Internal Medicine

## 2016-10-31 VITALS — BP 130/80 | HR 105 | Temp 98.0°F | Ht 64.0 in | Wt 104.2 lb

## 2016-10-31 DIAGNOSIS — F419 Anxiety disorder, unspecified: Secondary | ICD-10-CM

## 2016-10-31 DIAGNOSIS — Z23 Encounter for immunization: Secondary | ICD-10-CM

## 2016-10-31 DIAGNOSIS — R159 Full incontinence of feces: Secondary | ICD-10-CM

## 2016-10-31 DIAGNOSIS — J410 Simple chronic bronchitis: Secondary | ICD-10-CM

## 2016-10-31 MED ORDER — ALPRAZOLAM 0.5 MG PO TABS: 0.5000 mg | ORAL_TABLET | Freq: Four times a day (QID) | ORAL | 0 refills | Status: DC | PRN

## 2016-10-31 NOTE — Progress Notes (Signed)
[facility]prazolam Rx [facility]ed to [facility].  

## 2016-10-31 NOTE — Progress Notes (Signed)
Pre visit review using our clinic review tool, if applicable. No additional management support is needed unless otherwise documented below in the visit note. 

## 2016-10-31 NOTE — Patient Instructions (Signed)
Keep the appointment with a lung specialist  We are referring you to a gastroenterologist   Your blood count needs to be recheck in 3 months

## 2016-10-31 NOTE — Progress Notes (Signed)
Subjective:    Patient ID: Casey Schmidt, female    DOB: 26-Jun-1956, 60 y.o.   MRN: [id number]  DOS:  10/31/2016 Type of visit - description : rov Interval history: The patient has actually been dismissed from the office but the letter was recently sent and she has not gotten it yet  consequently we had to see the patient today. Anxiety: needs a refill on Xanax, states that she likes 120 tablets prescribed because she feels like she needs to take it 4 times a day. COPD: Since the last time I saw her several months ago she has been seen here about 4 times, diagnosed with pneumonia, subsequent x-ray improved. Had antibiotics and steroids. Overall respiratory status is okay, she has an appointment pending to see pulmonary. Also, complaining of difficulty controlling her stools, this is going on for a few months.  Review of Systems Denies back pain, takes methadone for hand pain mostly. Denies lower extremity paresthesias or urinary incontinence. Denies depression or suicidal ideas   Past Medical History:  Diagnosis Date  . Allergy    SEASONAL  . Anal fissure   . Anxiety   . Arthritis   . Cataract    BILATERAL  . COPD (chronic obstructive pulmonary disease) ([place])   . GERD (gastroesophageal reflux disease)   . Hypertension   . Migraines    h/o  . Multiple gastric ulcers in the 70s   d/t BC powders  . Pneumonia   . Right calcaneal fracture 2017   ORIF in 12/2015    Past Surgical History:  Procedure Laterality Date  . ganglion cyst removal Left remotely    from wrist  . ORIF CALCANEAL FRACTURE Right 12/30/2015    Dr. Mardelle Matte  . RIGHT OOPHORECTOMY    . TUBAL LIGATION    . WRIST SURGERY Right 2015   fx    Social History   Social History  . Marital status: Widowed    Spouse name: N/A  . Number of children: 2  . Years of education: N/A   Occupational History  . retired-disability d/t anxiety-djd    Social History Main Topics  . Smoking status: Current Every  Day Smoker    Packs/day: 0.25    Years: 32.00    Types: Cigarettes  . Smokeless tobacco: Never Used     Comment: smoking again as of 09-2014   . Alcohol use 0.0 oz/week     Comment: rare  . Drug use: No  . Sexual activity: Not on file   Other Topics Concern  . Not on file   Social History Narrative   Lost husband 2014, son lives w/ her, has a boyfriend        Medication List       Accurate as of 10/31/16  8:33 PM. Always use your most recent med list.          albuterol 108 (90 Base) MCG/ACT inhaler Commonly known as:  PROAIR HFA Inhale 2 puffs into the lungs every 4 (four) hours as needed for wheezing or shortness of breath.   ALPRAZolam 0.5 MG tablet Commonly known as:  XANAX Take 1 tablet (0.5 mg total) by mouth 4 (four) times daily as needed for anxiety.   diltiazem 2 % Gel Apply 1 application topically 3 (three) times daily.   ENSURE Take 3 Cans by mouth daily.   Fluticasone-Salmeterol 250-50 MCG/DOSE Aepb Commonly known as:  ADVAIR Inhale 1 puff into the lungs 2 (two) times daily.  methadone 10 MG/5ML solution Commonly known as:  DOLOPHINE Take 55 mg by mouth daily.   ranitidine 300 MG tablet Commonly known as:  ZANTAC Take 1 tablet (300 mg total) by mouth at bedtime.   VITAMIN D PO Take 1 tablet by mouth daily.          Objective:   Physical Exam BP 130/80 (BP Location: Right Arm, Patient Position: Sitting, Cuff Size: Normal)   Pulse (!) 105   Temp 98 F (36.7 C) (Oral)   Ht 5\' 4"  (1.626 m)   Wt 104 lb 3.2 oz (47.3 kg)   SpO2 95% Comment: RA  BMI 17.89 kg/m  General:   Well developed, underweight appearing in no distress HEENT:  Normocephalic . Face symmetric, atraumatic Lungs:  Rhonchi present, no crackles. Normal respiratory effort, no intercostal retractions, no accessory muscle use. Heart: RRR,  no murmur.  No pretibial edema bilaterally  Skin: Not pale. Not jaundice Neurologic:  alert & oriented X3.  Speech normal, gait  appropriate for age and unassisted Psych--  Cognition and judgment appear intact.  Cooperative with normal attention span and concentration.  Behavior appropriate. No anxious or depressed appearing.      Assessment & Plan:   Assessment HTN Anxiety-insomnia: Xanax rx by pcp (60/month) Pulmonary  --Snoring, poor sleep, sleep study neg for OSA [id number]) --COPD, never PFTs ($$) -- smoker GERD MSK --DJD --Sees the pain management clinic Underweight    PLAN COPD: Had pneumonia recently, follow-up chest x-ray clear. Has an appointment to see pulmonary. Anxiety: On chronic Xanax, she reports that SSRIs before never worked for her and the only thing that helped the Xanax or Valium (see my comments on 06/24/2015), this is a red flag. In addition she takes methadone prescribed elsewhere. She is requesting 120 tablets monthly, I prescribed 90 and no refills today. Leukocytosis: Ongoing elevated white count, the last 2 times labs were done shortly after she was prescribed prednisone. That needs to be recheck but without the use of prednisone. Bowel incontinence: Etiology unclear, refer to GI. Dismissal: Multiple  no-shows and cancellations are leading to her dismissal. We recently send a letter, another letter is printed and provided to  her today.

## 2016-10-31 NOTE — Assessment & Plan Note (Signed)
COPD: Had pneumonia recently, follow-up chest x-ray clear. Has an appointment to see pulmonary. Anxiety: On chronic Xanax, she reports that SSRIs before never worked for her and the only thing that helped the Xanax or Valium (see my comments on 06/24/2015), this is a red flag. In addition she takes methadone prescribed elsewhere. She is requesting 120 tablets monthly, I prescribed 90 and no refills today. Leukocytosis: Ongoing elevated white count, the last 2 times labs were done shortly after she was prescribed prednisone. That needs to be recheck but without the use of prednisone. Bowel incontinence: Etiology unclear, refer to GI. Dismissal: Multiple  no-shows and cancellations are leading to her dismissal. We recently send a letter, another letter is printed and provided to  her today.

## 2016-11-01 ENCOUNTER — Telehealth: Payer: Self-pay | Admitting: Internal Medicine

## 2016-11-01 DIAGNOSIS — D72829 Elevated white blood cell count, unspecified: Secondary | ICD-10-CM

## 2016-11-01 NOTE — Telephone Encounter (Signed)
I don't see oncology referral in system. Please advise

## 2016-11-01 NOTE — Telephone Encounter (Signed)
I did mention to the patient  that when somebody has elevated white blood cells persistently, that needs to be investigated, malignancy is a possibility. On reviewing the chart, her white cell have been elevated always in the context of taking steroids.  Recommend to discuss with her new PCP, I suggest a repeat a CBC without taking  steroids , if WBC  are elevated then she will need a referral. This evaluation  is not urgent

## 2016-11-01 NOTE — Telephone Encounter (Signed)
Per Dr. Larose Kells, Pt has leukocytosis. Recommends she come in for repeat CBC at her convenience, further advice w/ results. Katie-please call Pt and schedule lab appt. Thank you.

## 2016-11-01 NOTE — Telephone Encounter (Signed)
Patient called and spoke to [facility] and stated that she refused to come in for labs.

## 2016-11-01 NOTE — Telephone Encounter (Signed)
LVM informing patient Dr. Larose Kells would like her to come in for labs. Will try again this afternoon.

## 2016-11-01 NOTE — Telephone Encounter (Signed)
Patient states that Dr. Larose Kells told her she may have cancer. She is calling with the name of the oncologist she would like to be referred to...  Dr. Jonette Eva- he is a hemology oncologist  Phone: [phone]   Patient phone: [phone]

## 2016-11-02 NOTE — Telephone Encounter (Signed)
LMOM informing Pt of Dr. Larose Kells recommendations and thoughts. Instructed her to call if questions/concerns.

## 2016-11-03 IMAGING — CR DG FOOT COMPLETE 3+V*R*
3 series · 3 of 3 positions shown · non-contrast
Comparison: None.

CLINICAL DATA: Status post fall, with injury to right foot. Right
calcaneal pain and bruising. Initial encounter.

EXAM:
RIGHT FOOT COMPLETE - 3+ VIEW

[t foot ap right]
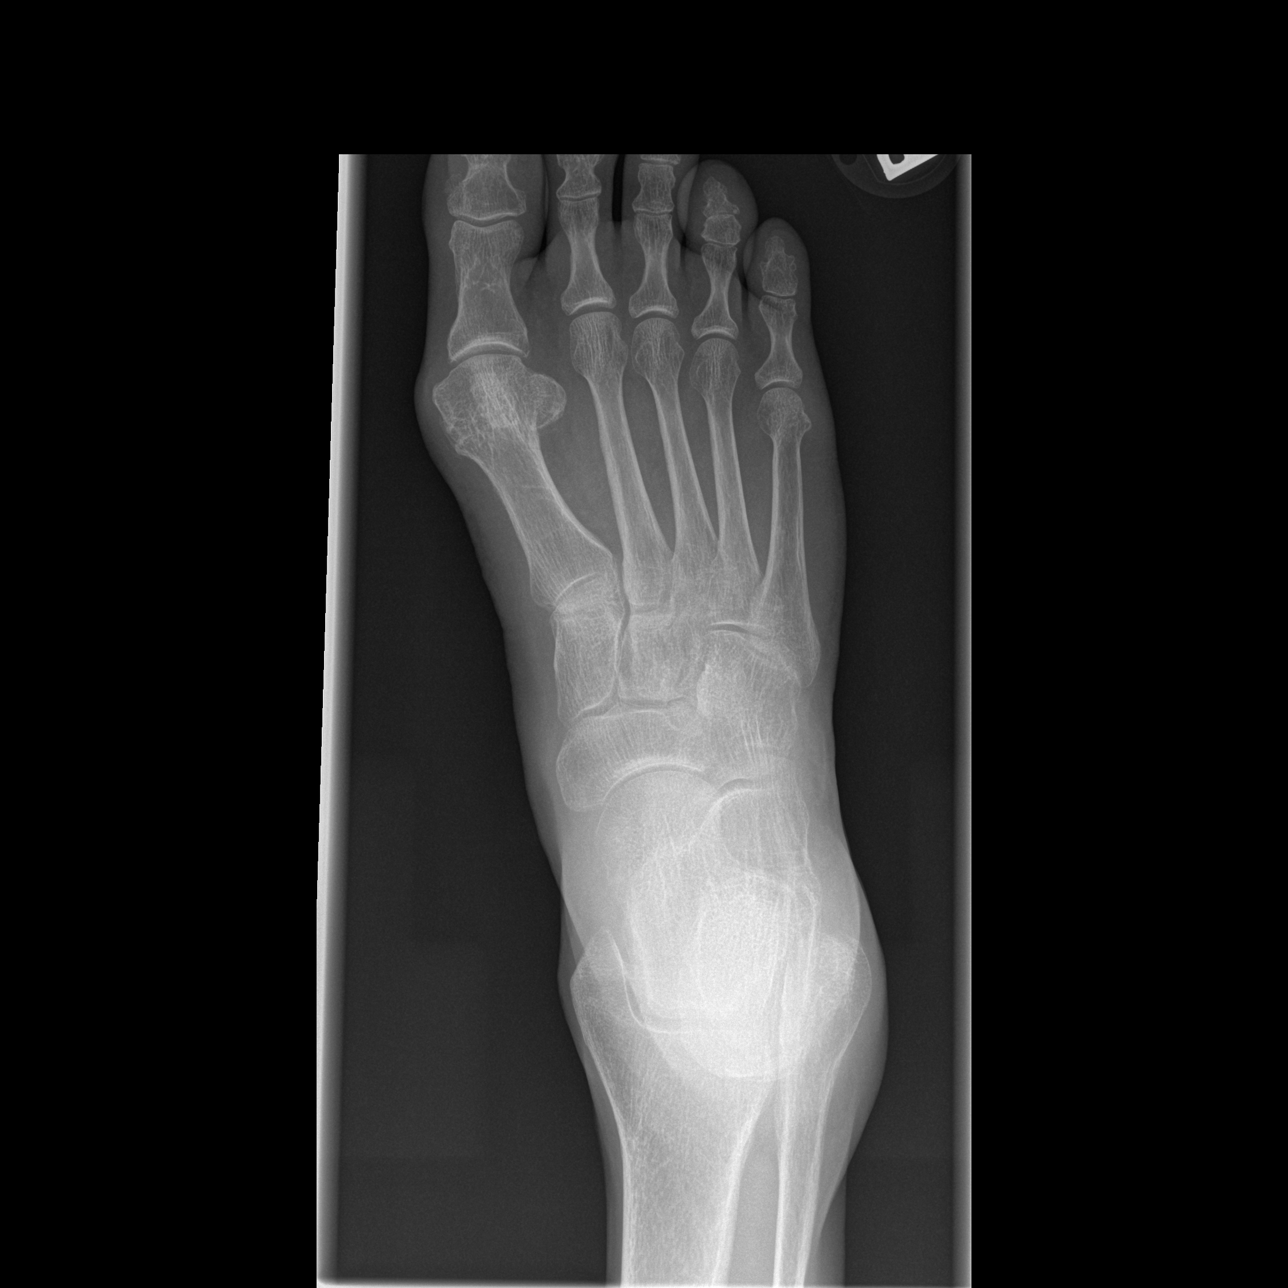

[t foot oblique right]
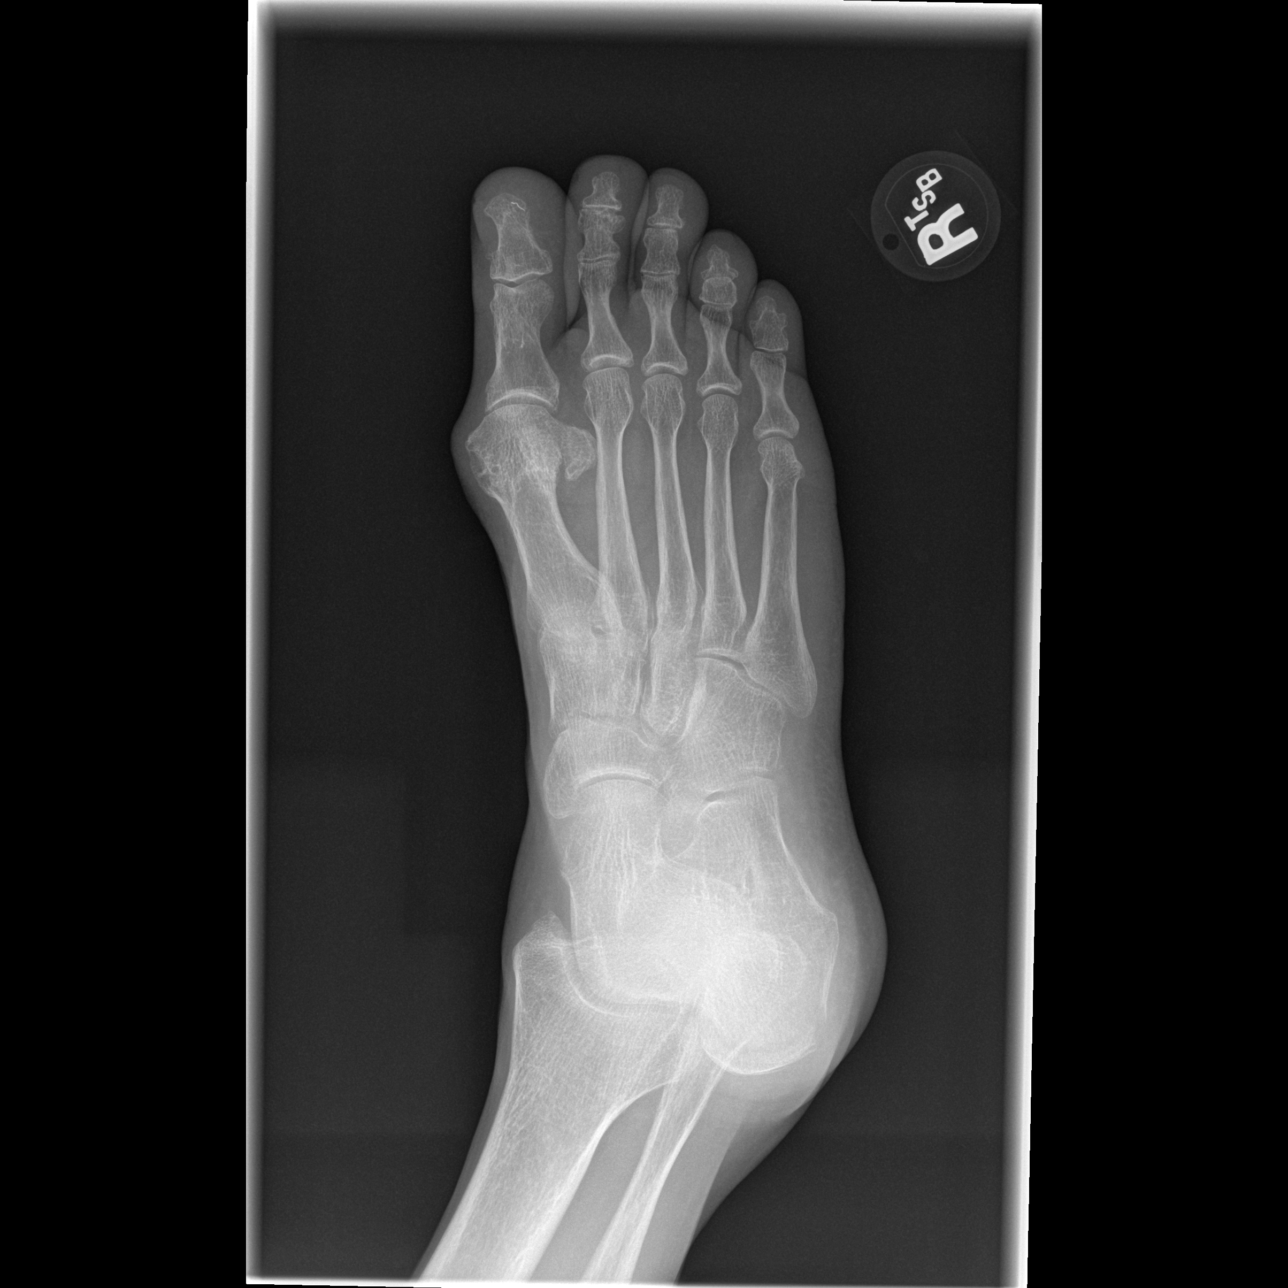

[t foot lat right]
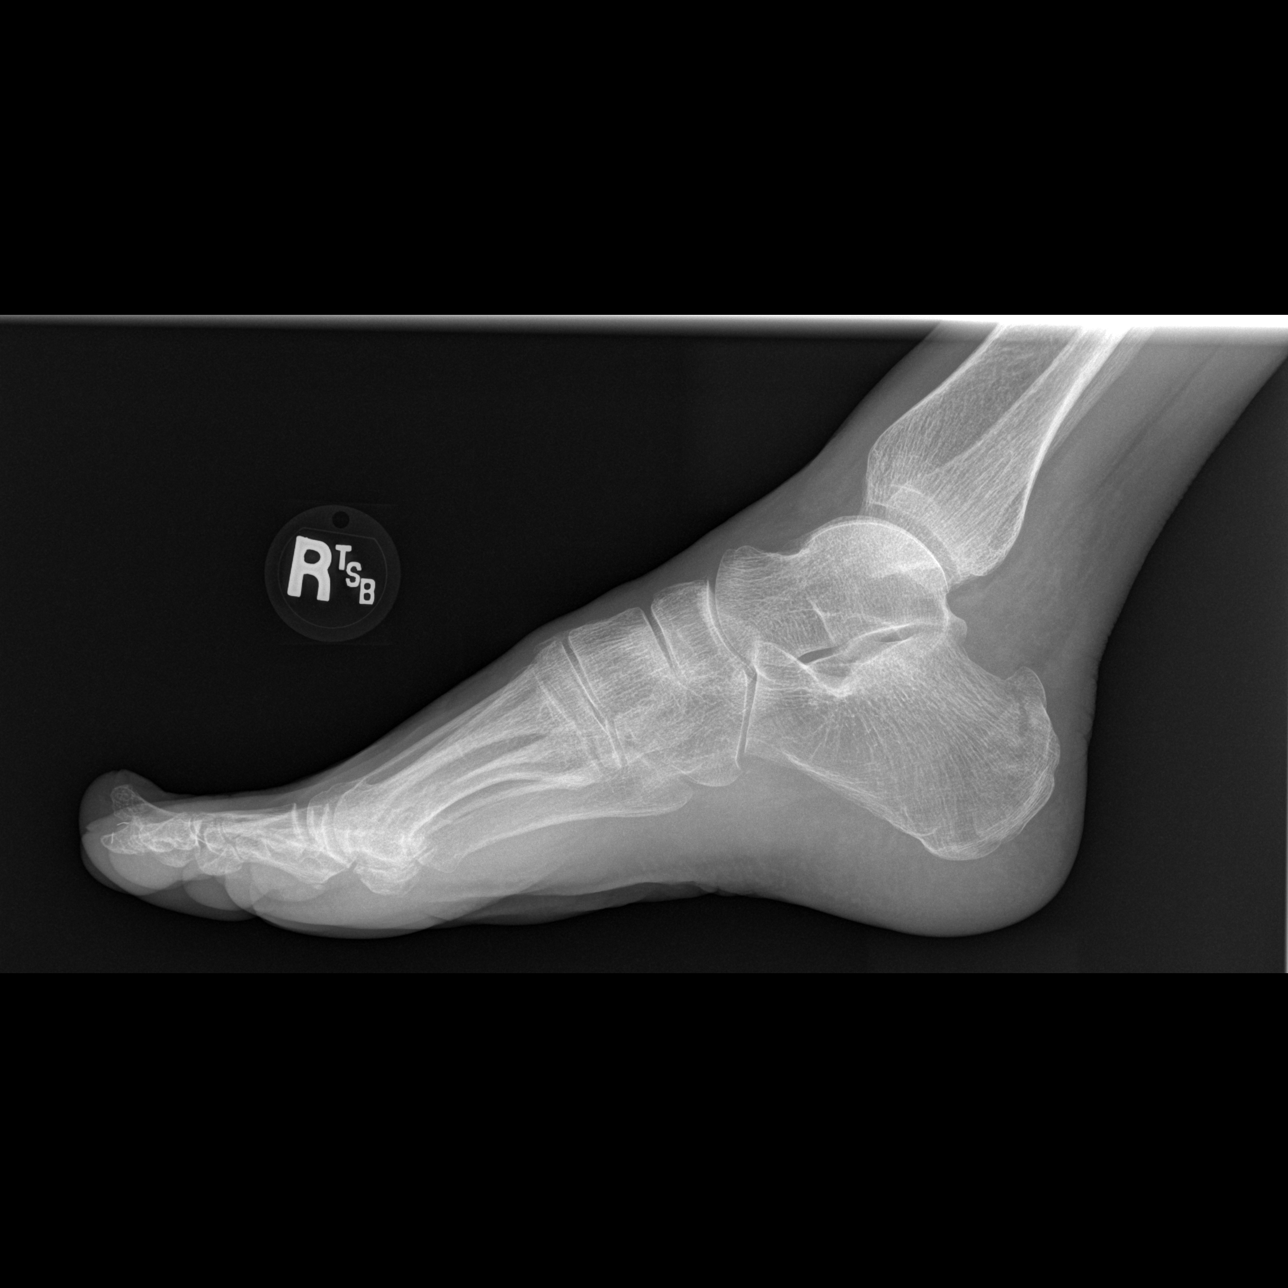

[3 of 3 positions shown; findings below may reference images not displayed]

FINDINGS: There is a mildly displaced fracture through the posterior aspect of
the calcaneus, with overlying edema within Kager's fat pad.

The joint spaces are preserved. There is no evidence of talar
subluxation; the subtalar joint is unremarkable in appearance.
IMPRESSION: Mildly displaced fracture through the posterior aspect of the
calcaneus, with overlying edema in Kager's fat pad.

## 2016-11-03 NOTE — Telephone Encounter (Signed)
Pt seen 10/31/2016, dismissal letter printed and given to Pt by Dr. Larose Kells. Pt aware.

## 2016-11-07 NOTE — Telephone Encounter (Signed)
Received signed domestic return receipt verifying delivery of certified letter on November 02, 2016. Article number [id number] [address]

## 2016-11-09 DIAGNOSIS — Z79899 Other long term (current) drug therapy: Secondary | ICD-10-CM | POA: Diagnosis not present

## 2016-11-09 DIAGNOSIS — J449 Chronic obstructive pulmonary disease, unspecified: Secondary | ICD-10-CM | POA: Diagnosis not present

## 2016-11-22 DIAGNOSIS — J449 Chronic obstructive pulmonary disease, unspecified: Secondary | ICD-10-CM | POA: Diagnosis not present

## 2016-12-18 ENCOUNTER — Institutional Professional Consult (permissible substitution): Payer: Medicare Other | Admitting: Emergency Medicine

## 2016-12-23 DIAGNOSIS — J449 Chronic obstructive pulmonary disease, unspecified: Secondary | ICD-10-CM | POA: Diagnosis not present

## 2016-12-25 ENCOUNTER — Ambulatory Visit: Payer: Medicare Other | Admitting: Gastroenterology

## 2016-12-27 ENCOUNTER — Other Ambulatory Visit: Payer: Self-pay | Admitting: Internal Medicine

## 2017-01-12 ENCOUNTER — Institutional Professional Consult (permissible substitution): Payer: Medicare Other | Admitting: Pulmonary Disease

## 2017-01-18 DIAGNOSIS — J449 Chronic obstructive pulmonary disease, unspecified: Secondary | ICD-10-CM | POA: Diagnosis not present

## 2017-01-20 DIAGNOSIS — J449 Chronic obstructive pulmonary disease, unspecified: Secondary | ICD-10-CM | POA: Diagnosis not present

## 2017-01-22 ENCOUNTER — Other Ambulatory Visit: Payer: Self-pay | Admitting: Internal Medicine

## 2017-01-30 ENCOUNTER — Ambulatory Visit: Payer: Medicare Other | Admitting: Gastroenterology

## 2017-02-20 DIAGNOSIS — J449 Chronic obstructive pulmonary disease, unspecified: Secondary | ICD-10-CM | POA: Diagnosis not present

## 2017-03-05 ENCOUNTER — Ambulatory Visit (INDEPENDENT_AMBULATORY_CARE_PROVIDER_SITE_OTHER): Payer: Medicare Other | Admitting: Gastroenterology

## 2017-03-05 ENCOUNTER — Encounter: Payer: Self-pay | Admitting: Gastroenterology

## 2017-03-05 VITALS — BP 120/74 | HR 92 | Ht 63.5 in | Wt 102.0 lb

## 2017-03-05 DIAGNOSIS — G8929 Other chronic pain: Secondary | ICD-10-CM

## 2017-03-05 DIAGNOSIS — K6289 Other specified diseases of anus and rectum: Secondary | ICD-10-CM

## 2017-03-05 DIAGNOSIS — Z86010 Personal history of colonic polyps: Secondary | ICD-10-CM

## 2017-03-05 DIAGNOSIS — M791 Myalgia: Secondary | ICD-10-CM | POA: Diagnosis not present

## 2017-03-05 DIAGNOSIS — M7918 Myalgia, other site: Secondary | ICD-10-CM

## 2017-03-05 MED ORDER — DICYCLOMINE HCL 10 MG PO CAPS: 10.0000 mg | ORAL_CAPSULE | Freq: Three times a day (TID) | ORAL | 11 refills | Status: DC

## 2017-03-05 NOTE — Patient Instructions (Signed)
We have sent the following medications to your pharmacy for you to pick up at your convenience:Bentyl.   You have been scheduled for a colonoscopy. Please follow written instructions given to you at your visit today.  Please pick up your prep supplies at the pharmacy within the next 1-3 days. If you use inhalers (even only as needed), please bring them with you on the day of your procedure. Your physician has requested that you go to www.startemmi.com and enter the access code given to you at your visit today. This web site gives a general overview about your procedure. However, you should still follow specific instructions given to you by our office regarding your preparation for the procedure.  Thank you for choosing me and [facility].  Pricilla Riffle. Dagoberto Ligas., MD., Marval Regal

## 2017-03-05 NOTE — Progress Notes (Signed)
History of Present Illness: This is a 61 year old female complaining of rectal pain, rectal pressure and buttock pain. She states the pain is there constantly. It is sometimes partially improved after bowel movement. She states she has 3-4 small bowel movements per day. She denies any rectal bleeding. She underwent colonoscopy in September 2016 with piecemeal polypectomies performed in the rectum and hepatic flexure. These polyps were tubular adenomas. She was advised to return for a 6 month colonoscopy to reassess the polypectomy sites however she failed to do so. She is interested in scheduling colonoscopy at this time.  Allergies  Allergen Reactions  . Amoxicillin Other (See Comments)    Blood in stools. Pt states she can't take any "cillins"  (?? See OV note 06-29-15)  . Aspirin Other (See Comments)    "Burns stomach"  . Clarithromycin     REACTION: GI upset  . Codeine     REACTION: GI upset  . Erythromycin     REACTION: GI upset  . Nsaids     REACTION: GI upset  . Tolmetin Other (See Comments)    REACTION: GI upset  . Tramadol Other (See Comments)    "Burns stomach"   Outpatient Medications Prior to Visit  Medication Sig Dispense Refill  . albuterol (PROAIR HFA) 108 (90 Base) MCG/ACT inhaler Inhale 2 puffs into the lungs every 4 (four) hours as needed for wheezing or shortness of breath. 18 g 5  . ALPRAZolam (XANAX) 0.5 MG tablet Take 1 tablet (0.5 mg total) by mouth 4 (four) times daily as needed for anxiety. 80 tablet 0  . Cholecalciferol (VITAMIN D PO) Take 1 tablet by mouth daily.    Marland Kitchen diltiazem 2 % GEL Apply 1 application topically 3 (three) times daily. 30 g 1  . ENSURE (ENSURE) Take 3 Cans by mouth daily.    . Fluticasone-Salmeterol (ADVAIR) 250-50 MCG/DOSE AEPB Inhale 1 puff into the lungs 2 (two) times daily. 60 each 0  . methadone (DOLOPHINE) 10 MG/5ML solution Take 55 mg by mouth daily.    . ranitidine (ZANTAC) 300 MG tablet Take 1 tablet (300 mg total) by mouth at  bedtime. 30 tablet 3   Facility-Administered Medications Prior to Visit  Medication Dose Route Frequency Provider Last Rate Last Dose  . ipratropium-albuterol (DUONEB) 0.5-2.5 (3) MG/3ML nebulizer solution 3 mL  3 mL Nebulization Q6H Edward Saguier, PA-C   3 mL at 08/16/16 1520   Past Medical History:  Diagnosis Date  . Allergy    SEASONAL  . Anal fissure   . Anxiety   . Arthritis   . Cataract    BILATERAL  . COPD (chronic obstructive pulmonary disease) ([place])   . GERD (gastroesophageal reflux disease)   . Hypertension   . Migraines    h/o  . Multiple gastric ulcers in the 70s   d/t BC powders  . Pneumonia   . Right calcaneal fracture 2017   ORIF in 12/2015  . Tubular adenoma of colon 07/2015   Past Surgical History:  Procedure Laterality Date  . ganglion cyst removal Left remotely    from wrist  . ORIF CALCANEAL FRACTURE Right 12/30/2015    Dr. Mardelle Matte  . RIGHT OOPHORECTOMY    . TUBAL LIGATION    . WRIST SURGERY Right 2015   fx   Social History   Social History  . Marital status: Widowed    Spouse name: N/A  . Number of children: 2  . Years of education: N/A  Occupational History  . retired-disability d/t anxiety-djd    Social History Main Topics  . Smoking status: Current Every Day Smoker    Packs/day: 0.25    Years: 32.00    Types: Cigarettes  . Smokeless tobacco: Never Used     Comment: smoking again as of 09-2014   . Alcohol use 0.0 oz/week     Comment: rare  . Drug use: No  . Sexual activity: Not Asked   Other Topics Concern  . None   Social History Narrative   Lost husband 2014, son lives w/ her, has a boyfriend   Family History  Problem Relation Age of Onset  . Hypertension Mother   . Emphysema Mother   . Hypertension Father   . Skin cancer Father   . Stroke Father   . Colon cancer Maternal Grandmother   . Colon polyps Paternal Aunt   . Heart disease Paternal Grandfather   . Breast cancer Neg Hx   . CAD Neg Hx       Physical  Exam: General: Well developed, well nourished, thin, appears older than stated age, no acute distress Head: Normocephalic and atraumatic Eyes:  sclerae anicteric, EOMI Ears: Normal auditory acuity Mouth: No deformity or lesions Lungs: Clear throughout to auscultation Heart: Regular rate and rhythm; no murmurs, rubs or bruits Abdomen: Soft, non tender and non distended. No masses, hepatosplenomegaly or hernias noted. Normal Bowel sounds Rectal: Deferred to colonoscopy Musculoskeletal: Symmetrical with no gross deformities  Pulses:  Normal pulses noted Extremities: No clubbing, cyanosis, edema or deformities noted Neurological: Alert oriented x 4, grossly nonfocal Psychological:  Alert and cooperative. Normal mood and affect   Assessment and Recommendations:  1. Rectal pain, rectal pressure, buttock pain. Etiology unclear. Possible musculoskeletal or GI etiologies. Begin dicyclomine 10 mg 3 times a day. Further evaluation at colonoscopy.  2. Personal history of tubular adenomas removed by piecemeal polypectomy in the rectum and hepatic flexure. She is overdue for colonoscopy. Schedule colonoscopy. The risks (including bleeding, perforation, infection, missed lesions, medication reactions and possible hospitalization or surgery if complications occur), benefits, and alternatives to colonoscopy with possible biopsy and possible polypectomy were discussed with the patient and they consent to proceed.

## 2017-03-06 ENCOUNTER — Encounter: Payer: Self-pay | Admitting: Gastroenterology

## 2017-03-06 ENCOUNTER — Institutional Professional Consult (permissible substitution): Payer: Medicare Other | Admitting: Pulmonary Disease

## 2017-03-20 ENCOUNTER — Ambulatory Visit (AMBULATORY_SURGERY_CENTER): Payer: Medicare Other | Admitting: Gastroenterology

## 2017-03-20 ENCOUNTER — Encounter: Payer: Self-pay | Admitting: Gastroenterology

## 2017-03-20 VITALS — BP 133/82 | HR 83 | Temp 97.3°F | Resp 16 | Ht 63.5 in | Wt 102.0 lb

## 2017-03-20 DIAGNOSIS — D120 Benign neoplasm of cecum: Secondary | ICD-10-CM

## 2017-03-20 DIAGNOSIS — D125 Benign neoplasm of sigmoid colon: Secondary | ICD-10-CM

## 2017-03-20 DIAGNOSIS — Z86010 Personal history of colonic polyps: Secondary | ICD-10-CM | POA: Diagnosis present

## 2017-03-20 DIAGNOSIS — D123 Benign neoplasm of transverse colon: Secondary | ICD-10-CM | POA: Diagnosis not present

## 2017-03-20 MED ORDER — SODIUM CHLORIDE 0.9 % IV SOLN: 500.0000 mL | INTRAVENOUS | Status: AC

## 2017-03-20 NOTE — Progress Notes (Signed)
Pt's states no medical or surgical changes since previsit or office visit. 

## 2017-03-20 NOTE — Progress Notes (Signed)
Called to room to assist during endoscopic procedure.  Patient ID and intended procedure confirmed with present staff. Received instructions for my participation in the procedure from the performing physician.  

## 2017-03-20 NOTE — Progress Notes (Signed)
Report to PACU, RN, vss, BBS= Clear.  

## 2017-03-20 NOTE — Op Note (Signed)
[facility] Patient Name: Casey Schmidt Procedure Date: 03/20/2017 9:04 AM MRN: [id number] Endoscopist: Ladene Artist , MD Age: 61 Referring MD:  Date of Birth: 11-Apr-1956 Gender: Female Account #: [id number] Procedure:                Colonoscopy Indications:              Surveillance: History of piecemeal removal adenoma                            on last colonoscopy (< 3 yrs) Medicines:                Monitored Anesthesia Care Procedure:                Pre-Anesthesia Assessment:                           - Prior to the procedure, a History and Physical                            was performed, and patient medications and                            allergies were reviewed. The patient's tolerance of                            previous anesthesia was also reviewed. The risks                            and benefits of the procedure and the sedation                            options and risks were discussed with the patient.                            All questions were answered, and informed consent                            was obtained. Prior Anticoagulants: The patient has                            taken no previous anticoagulant or antiplatelet                            agents. ASA Grade Assessment: III - A patient with                            severe systemic disease. After reviewing the risks                            and benefits, the patient was deemed in                            satisfactory condition to undergo the procedure.  After obtaining informed consent, the colonoscope                            was passed under direct vision. Throughout the                            procedure, the patient's blood pressure, pulse, and                            oxygen saturations were monitored continuously. The                            Colonoscope was introduced through the anus and                            advanced to the the  cecum, identified by                            appendiceal orifice and ileocecal valve. The                            ileocecal valve, appendiceal orifice, and rectum                            were photographed. The quality of the bowel                            preparation was adequate. The colonoscopy was                            performed without difficulty. The patient tolerated                            the procedure well. Scope In: 9:05:49 AM Scope Out: 9:23:40 AM Scope Withdrawal Time: 0 hours 15 minutes 40 seconds  Total Procedure Duration: 0 hours 17 minutes 51 seconds  Findings:                 The perianal and digital rectal examinations were                            normal.                           Three sessile polyps were found in the sigmoid                            colon, transverse colon and cecum. The polyps were                            6 to 9 mm in size. These polyps were removed with a                            cold snare. Resection and retrieval were complete.  A small post polypectomy scar was found in the                            rectum marked by prior tattoos. There was no                            evidence of the previous polyp.                           The exam was otherwise without abnormality on                            direct and retroflexion views. Complications:            No immediate complications. Estimated blood loss:                            None. Estimated Blood Loss:     Estimated blood loss: none. Impression:               - Three 6 to 9 mm polyps in the sigmoid colon, in                            the transverse colon and in the cecum, removed with                            a cold snare. Resected and retrieved.                           - Post-polypectomy scar and prior tattoos in the                            rectum.                           - The examination was otherwise normal on direct                             and retroflexion views. Recommendation:           - Repeat colonoscopy in 3 years for surveillance.                           - Patient has a contact number available for                            emergencies. The signs and symptoms of potential                            delayed complications were discussed with the                            patient. Return to normal activities tomorrow.  Written discharge instructions were provided to the                            patient.                           - Resume previous diet.                           - Continue present medications.                           - Await pathology results. Ladene Artist, MD 03/20/2017 9:28:30 AM This report has been signed electronically.

## 2017-03-20 NOTE — Patient Instructions (Signed)
YOU HAD AN ENDOSCOPIC PROCEDURE TODAY AT THE [facility]:   Refer to the procedure report that was given to you for any specific questions about what was found during the examination.  If the procedure report does not answer your questions, please call your gastroenterologist to clarify.  If you requested that your care partner not be given the details of your procedure findings, then the procedure report has been included in a sealed envelope for you to review at your convenience later.  YOU SHOULD EXPECT: Some feelings of bloating in the abdomen. Passage of more gas than usual.  Walking can help get rid of the air that was put into your GI tract during the procedure and reduce the bloating. If you had a lower endoscopy (such as a colonoscopy or flexible sigmoidoscopy) you may notice spotting of blood in your stool or on the toilet paper. If you underwent a bowel prep for your procedure, you may not have a normal bowel movement for a few days.  Please Note:  You might notice some irritation and congestion in your nose or some drainage.  This is from the oxygen used during your procedure.  There is no need for concern and it should clear up in a day or so.  SYMPTOMS TO REPORT IMMEDIATELY:   Following lower endoscopy (colonoscopy or flexible sigmoidoscopy):  Excessive amounts of blood in the stool  Significant tenderness or worsening of abdominal pains  Swelling of the abdomen that is new, acute  Fever of 100F or higher    For urgent or emergent issues, a gastroenterologist can be reached at any hour by calling [phone].   DIET:  We do recommend a small meal at first, but then you may proceed to your regular diet.  Drink plenty of fluids but you should avoid alcoholic beverages for 24 hours.  ACTIVITY:  You should plan to take it easy for the rest of today and you should NOT DRIVE or use heavy machinery until tomorrow (because of the sedation medicines used during the test).     FOLLOW UP: Our staff will call the number listed on your records the next business day following your procedure to check on you and address any questions or concerns that you may have regarding the information given to you following your procedure. If we do not reach you, we will leave a message.  However, if you are feeling well and you are not experiencing any problems, there is no need to return our call.  We will assume that you have returned to your regular daily activities without incident.  If any biopsies were taken you will be contacted by phone or by letter within the next 1-3 weeks.  Please call us at [phone] if you have not heard about the biopsies in 3 weeks.    SIGNATURES/CONFIDENTIALITY: You and/or your care partner have signed paperwork which will be entered into your electronic medical record.  These signatures attest to the fact that that the information above on your After Visit Summary has been reviewed and is understood.  Full responsibility of the confidentiality of this discharge information lies with you and/or your care-partner.   Resume medications. Information given on polyps. 

## 2017-03-21 ENCOUNTER — Telehealth: Payer: Self-pay | Admitting: *Deleted

## 2017-03-21 NOTE — Telephone Encounter (Signed)
  Follow up Call-  Call back number 03/20/2017 07/27/2015  Post procedure Call Back phone  # [phone]  Permission to leave phone message Yes Yes  Some recent data might be hidden     Patient questions:  Do you have a fever, pain , or abdominal swelling? No. Pain Score  0 *  Have you tolerated food without any problems? Yes.    Have you been able to return to your normal activities? Yes.    Do you have any questions about your discharge instructions: Diet   No. Medications  No. Follow up visit  No.  Do you have questions or concerns about your Care? No.  Actions: * If pain score is 4 or above: No action needed, pain <4.

## 2017-03-22 DIAGNOSIS — J449 Chronic obstructive pulmonary disease, unspecified: Secondary | ICD-10-CM | POA: Diagnosis not present

## 2017-04-02 ENCOUNTER — Encounter: Payer: Self-pay | Admitting: Gastroenterology

## 2017-04-22 DIAGNOSIS — J449 Chronic obstructive pulmonary disease, unspecified: Secondary | ICD-10-CM | POA: Diagnosis not present

## 2017-05-02 DIAGNOSIS — Z79899 Other long term (current) drug therapy: Secondary | ICD-10-CM | POA: Diagnosis not present

## 2017-05-02 DIAGNOSIS — K219 Gastro-esophageal reflux disease without esophagitis: Secondary | ICD-10-CM | POA: Diagnosis not present

## 2017-05-02 DIAGNOSIS — J449 Chronic obstructive pulmonary disease, unspecified: Secondary | ICD-10-CM | POA: Diagnosis not present

## 2017-05-22 DIAGNOSIS — J449 Chronic obstructive pulmonary disease, unspecified: Secondary | ICD-10-CM | POA: Diagnosis not present

## 2017-05-28 ENCOUNTER — Institutional Professional Consult (permissible substitution): Payer: Medicare Other | Admitting: Internal Medicine

## 2017-07-23 IMAGING — DX DG CHEST 2V
2 series · 2 of 2 positions shown · non-contrast
Comparison: August 16, 2016.

CLINICAL DATA: Recent pneumonia with persistent cough and
congestion.

EXAM:
CHEST  2 VIEW

[chest pa]
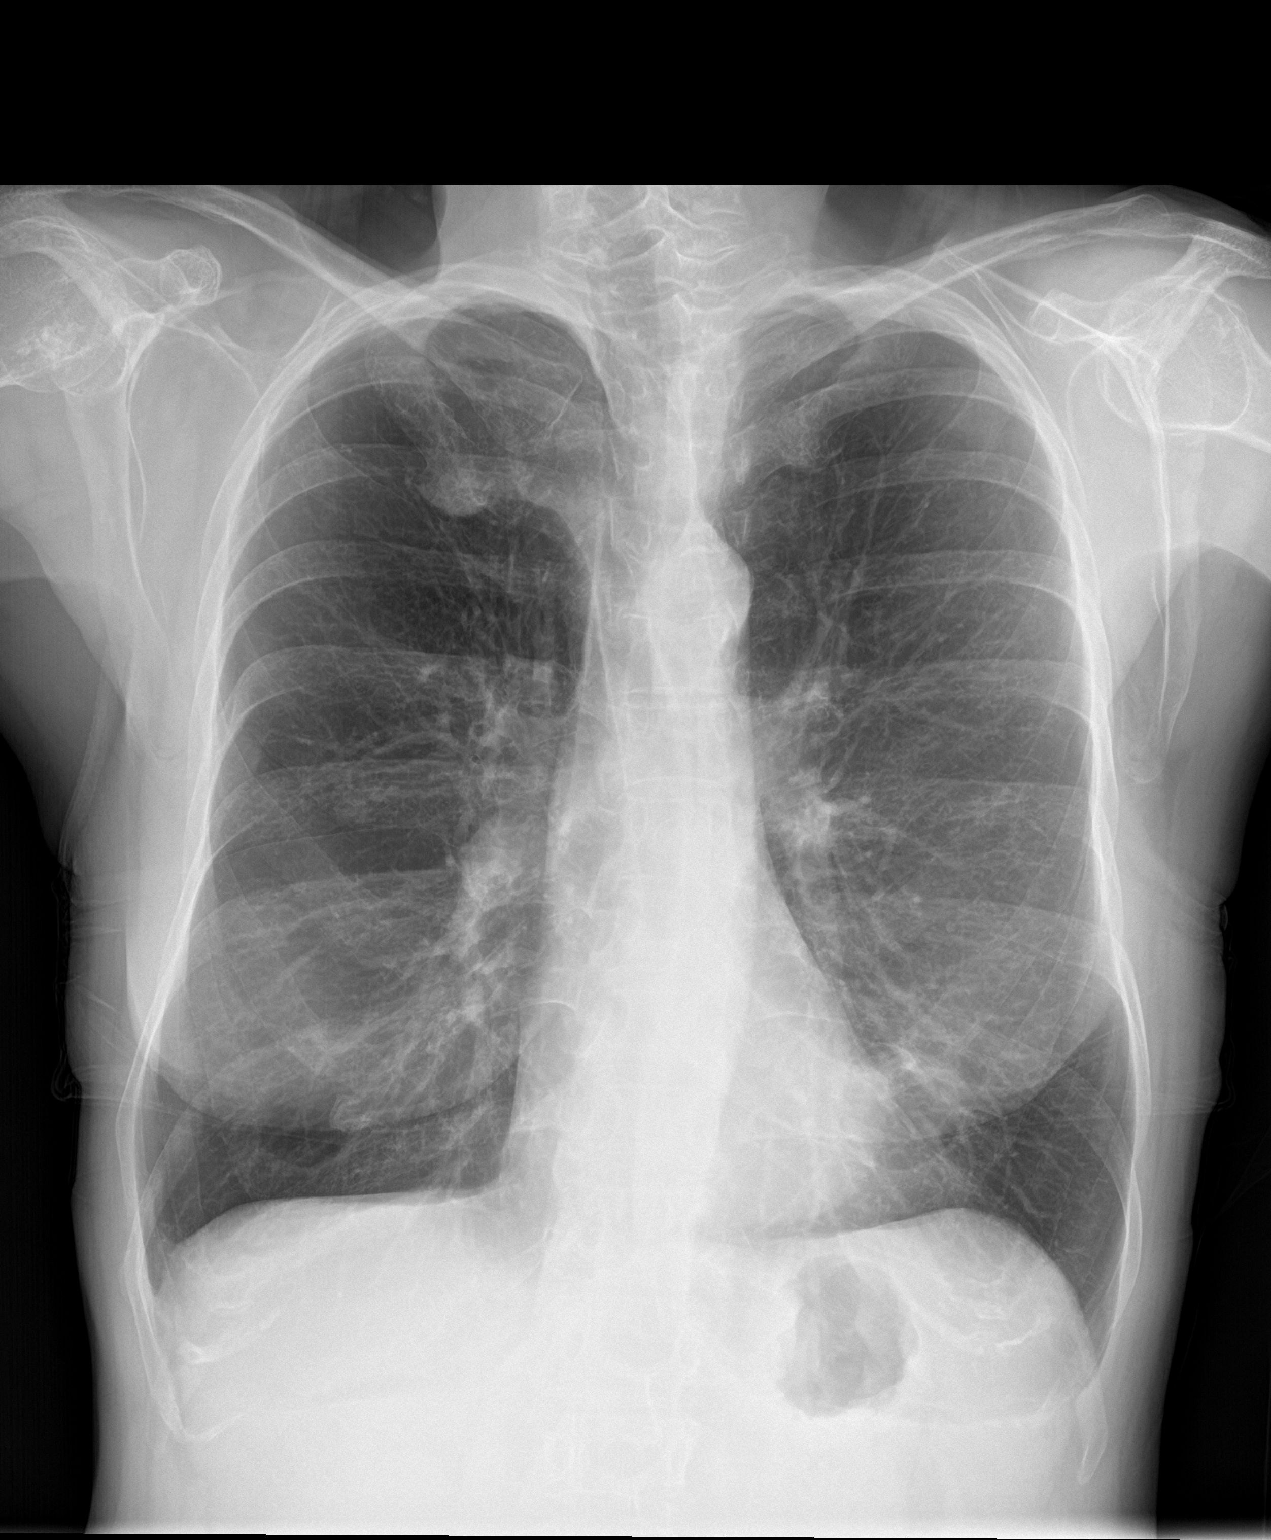

[chest lat]
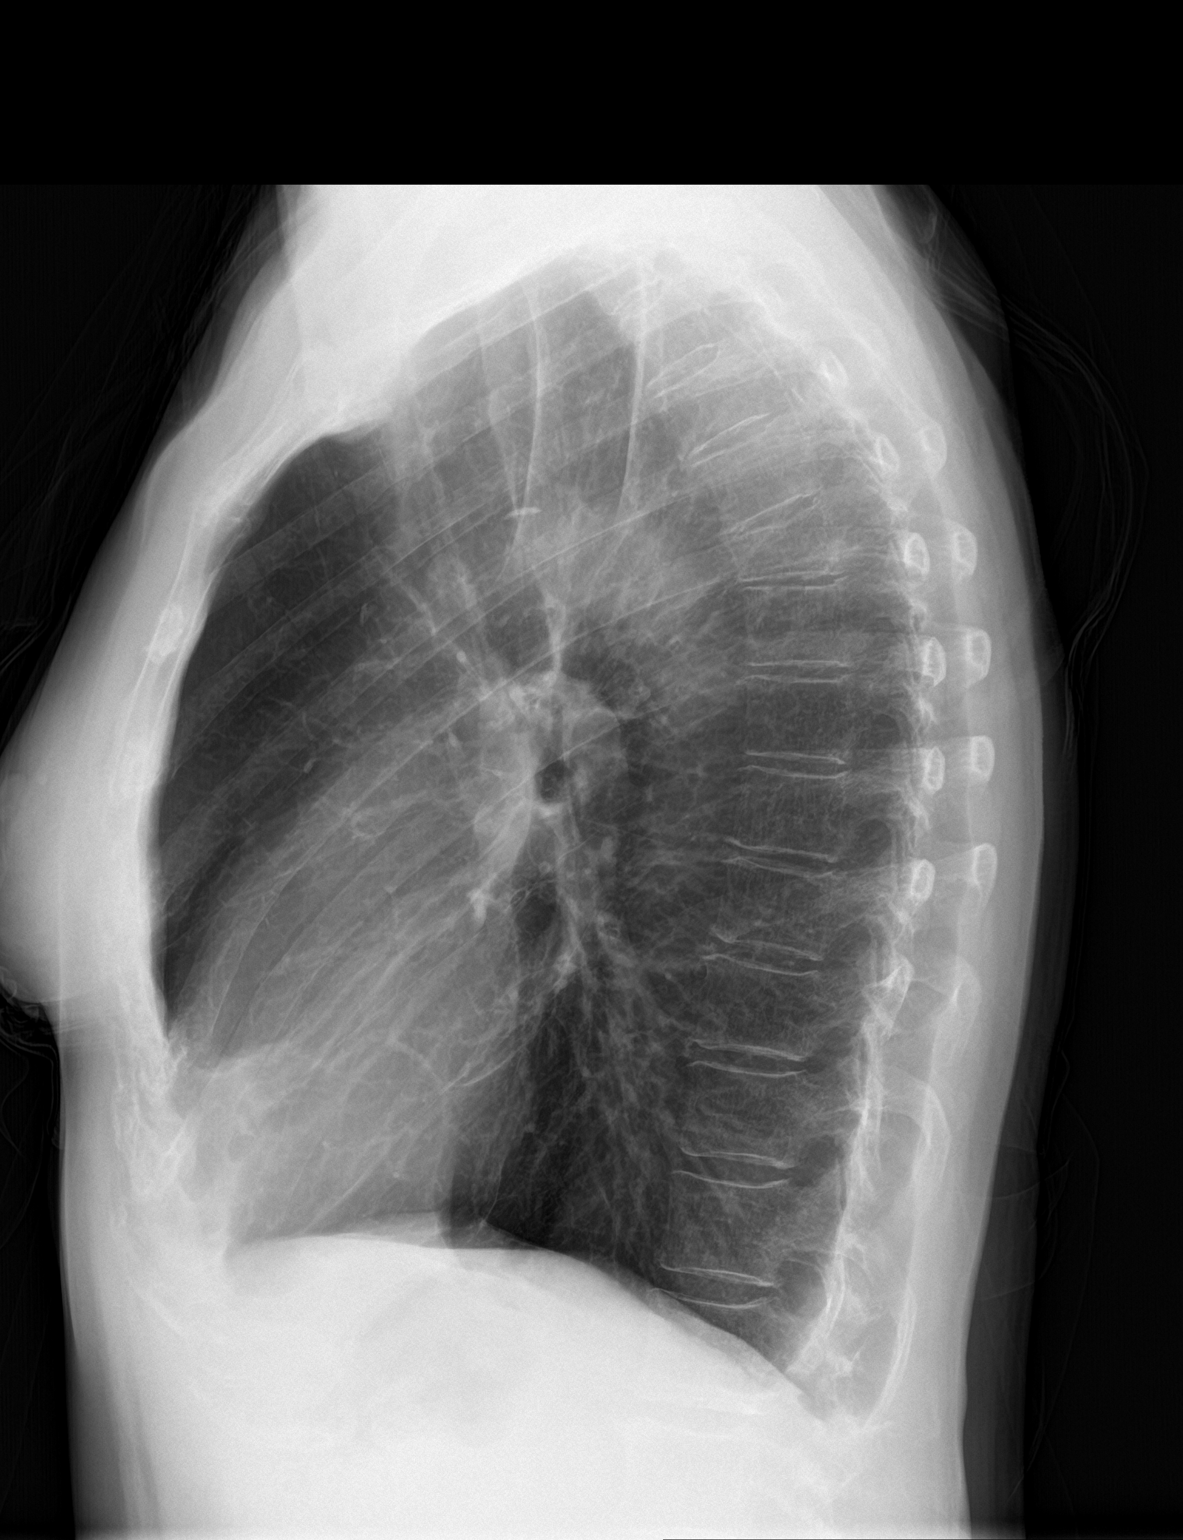

[2 of 2 positions shown; findings below may reference images not displayed]

FINDINGS: The lungs are somewhat hyperexpanded. There is evidence of bullous
disease in the upper lobes. There is no edema or consolidation.
Pulmonary vascularity appears somewhat diminished in the upper
lobes, apparently due to the bullous disease. No adenopathy. There
is atherosclerotic calcification in the aorta. There is stable
anterior wedging of an upper thoracic vertebral body. There are
sclerotic areas in each humeral head, likely due to bone infarcts.
There is an exostosis arising from the anterior right first rib,
stable.
IMPRESSION: Upper lobe bullous disease. No edema or consolidation. Interval
clearing of patchy infiltrate from the left mid lung. Stable cardiac
silhouette. Aortic atherosclerosis noted. Probable small infarcts in
each humeral head.

## 2017-07-31 ENCOUNTER — Institutional Professional Consult (permissible substitution): Payer: Medicare Other | Admitting: Internal Medicine

## 2017-08-03 ENCOUNTER — Institutional Professional Consult (permissible substitution): Payer: Medicare Other | Admitting: Internal Medicine

## 2017-08-14 DIAGNOSIS — M129 Arthropathy, unspecified: Secondary | ICD-10-CM | POA: Diagnosis not present

## 2017-08-14 DIAGNOSIS — Z23 Encounter for immunization: Secondary | ICD-10-CM | POA: Diagnosis not present

## 2017-08-31 ENCOUNTER — Institutional Professional Consult (permissible substitution): Payer: Medicare Other | Admitting: Internal Medicine

## 2017-09-14 ENCOUNTER — Emergency Department (HOSPITAL_COMMUNITY)
Admission: EM | Admit: 2017-09-14 | Discharge: 2017-09-15 | Disposition: A | Discharge status: Home / Self Care | Payer: No Typology Code available for payment source | Attending: Emergency Medicine | Admitting: Emergency Medicine

## 2017-09-14 ENCOUNTER — Encounter (HOSPITAL_COMMUNITY): Payer: Self-pay

## 2017-09-14 ENCOUNTER — Emergency Department (HOSPITAL_COMMUNITY): Payer: No Typology Code available for payment source

## 2017-09-14 DIAGNOSIS — S4992XA Unspecified injury of left shoulder and upper arm, initial encounter: Secondary | ICD-10-CM | POA: Diagnosis present

## 2017-09-14 DIAGNOSIS — Z79899 Other long term (current) drug therapy: Secondary | ICD-10-CM | POA: Diagnosis not present

## 2017-09-14 DIAGNOSIS — Y999 Unspecified external cause status: Secondary | ICD-10-CM | POA: Diagnosis not present

## 2017-09-14 DIAGNOSIS — S40012A Contusion of left shoulder, initial encounter: Secondary | ICD-10-CM | POA: Diagnosis not present

## 2017-09-14 DIAGNOSIS — J449 Chronic obstructive pulmonary disease, unspecified: Secondary | ICD-10-CM | POA: Insufficient documentation

## 2017-09-14 DIAGNOSIS — M25461 Effusion, right knee: Secondary | ICD-10-CM | POA: Insufficient documentation

## 2017-09-14 DIAGNOSIS — Y9389 Activity, other specified: Secondary | ICD-10-CM | POA: Diagnosis not present

## 2017-09-14 DIAGNOSIS — Y929 Unspecified place or not applicable: Secondary | ICD-10-CM | POA: Insufficient documentation

## 2017-09-14 DIAGNOSIS — F17210 Nicotine dependence, cigarettes, uncomplicated: Secondary | ICD-10-CM | POA: Insufficient documentation

## 2017-09-14 DIAGNOSIS — I10 Essential (primary) hypertension: Secondary | ICD-10-CM | POA: Diagnosis not present

## 2017-09-14 MED ORDER — CYCLOBENZAPRINE HCL 10 MG PO TABS: 10.0000 mg | ORAL_TABLET | Freq: Two times a day (BID) | ORAL | 0 refills | Status: AC | PRN

## 2017-09-14 MED ORDER — HYDROCODONE-ACETAMINOPHEN 5-325 MG PO TABS: 1.0000 | ORAL_TABLET | Freq: Four times a day (QID) | ORAL | 0 refills | Status: DC | PRN
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
## 2022-05-11 ENCOUNTER — Ambulatory Visit (HOSPITAL_COMMUNITY)
Admission: EM | Admit: 2022-05-11 | Discharge: 2022-05-11 | Disposition: A | Discharge status: Home / Self Care | Payer: Medicare Other | Attending: Family Medicine | Admitting: Family Medicine

## 2022-05-11 ENCOUNTER — Ambulatory Visit (HOSPITAL_COMMUNITY): Payer: Medicare Other

## 2022-05-11 ENCOUNTER — Encounter (HOSPITAL_COMMUNITY): Payer: Self-pay

## 2022-05-11 ENCOUNTER — Ambulatory Visit (INDEPENDENT_AMBULATORY_CARE_PROVIDER_SITE_OTHER): Payer: Medicare Other

## 2022-05-11 ENCOUNTER — Other Ambulatory Visit (HOSPITAL_COMMUNITY): Payer: Medicare Other

## 2022-05-11 DIAGNOSIS — S42291A Other displaced fracture of upper end of right humerus, initial encounter for closed fracture: Secondary | ICD-10-CM

## 2022-05-11 DIAGNOSIS — M25511 Pain in right shoulder: Secondary | ICD-10-CM

## 2022-05-11 MED ORDER — OXYCODONE HCL 5 MG PO TABS: 5.0000 mg | ORAL_TABLET | Freq: Four times a day (QID) | ORAL | 0 refills | Status: DC | PRN

## 2022-05-11 NOTE — ED Triage Notes (Signed)
Pt states she fell last night and thinks she dislocated her  right shoulder.  Bruising to arm from shoulder down. N/V intact. Pt unable to move arm.

## 2022-05-11 NOTE — Discharge Instructions (Addendum)

## 2022-05-15 ENCOUNTER — Other Ambulatory Visit: Payer: Self-pay | Admitting: Physician Assistant

## 2022-05-15 ENCOUNTER — Encounter: Payer: Self-pay | Admitting: Physician Assistant

## 2022-05-15 ENCOUNTER — Ambulatory Visit: Payer: Medicare Other | Admitting: Physician Assistant

## 2022-05-15 ENCOUNTER — Ambulatory Visit: Payer: Self-pay

## 2022-05-15 DIAGNOSIS — S42201A Unspecified fracture of upper end of right humerus, initial encounter for closed fracture: Secondary | ICD-10-CM

## 2022-05-15 DIAGNOSIS — M25511 Pain in right shoulder: Secondary | ICD-10-CM | POA: Diagnosis not present

## 2022-05-15 DIAGNOSIS — S42309A Unspecified fracture of shaft of humerus, unspecified arm, initial encounter for closed fracture: Secondary | ICD-10-CM | POA: Insufficient documentation

## 2022-05-15 MED ORDER — OXYCODONE HCL 5 MG PO TABS: 5.0000 mg | ORAL_TABLET | Freq: Four times a day (QID) | ORAL | 0 refills | Status: DC | PRN

## 2022-05-25 ENCOUNTER — Encounter: Payer: Self-pay | Admitting: Orthopaedic Surgery

## 2022-05-25 ENCOUNTER — Ambulatory Visit (INDEPENDENT_AMBULATORY_CARE_PROVIDER_SITE_OTHER): Payer: Medicare Other

## 2022-05-25 ENCOUNTER — Ambulatory Visit (INDEPENDENT_AMBULATORY_CARE_PROVIDER_SITE_OTHER): Payer: Medicare Other | Admitting: Orthopaedic Surgery

## 2022-05-25 DIAGNOSIS — M25521 Pain in right elbow: Secondary | ICD-10-CM

## 2022-05-25 DIAGNOSIS — S42201A Unspecified fracture of upper end of right humerus, initial encounter for closed fracture: Secondary | ICD-10-CM

## 2022-05-25 DIAGNOSIS — M25531 Pain in right wrist: Secondary | ICD-10-CM

## 2022-05-25 MED ORDER — OXYCODONE HCL 5 MG PO TABS: 5.0000 mg | ORAL_TABLET | Freq: Four times a day (QID) | ORAL | 0 refills | Status: DC | PRN
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
## 2022-06-08 ENCOUNTER — Encounter: Payer: Self-pay | Admitting: Orthopaedic Surgery

## 2022-06-08 ENCOUNTER — Ambulatory Visit (INDEPENDENT_AMBULATORY_CARE_PROVIDER_SITE_OTHER): Payer: Medicare Other | Admitting: Orthopaedic Surgery

## 2022-06-08 ENCOUNTER — Ambulatory Visit (INDEPENDENT_AMBULATORY_CARE_PROVIDER_SITE_OTHER): Payer: Medicare Other

## 2022-06-08 DIAGNOSIS — S42201A Unspecified fracture of upper end of right humerus, initial encounter for closed fracture: Secondary | ICD-10-CM | POA: Diagnosis not present

## 2022-06-08 MED ORDER — OXYCODONE HCL 5 MG PO TABS: 5.0000 mg | ORAL_TABLET | Freq: Four times a day (QID) | ORAL | 0 refills | Status: DC | PRN

## 2022-06-08 NOTE — Progress Notes (Signed)
Office Visit Note   Patient: Casey Schmidt           Date of Birth: [birth year]           MRN: [id number] Visit Date: 06/08/2022              Requested by: Curt Jews, PA-C [address] [place],  [place] [address] PCP: Curt Jews, PA-C   Assessment & Plan: Visit Diagnoses:  1. Closed fracture of proximal end of right humerus, unspecified fracture morphology, initial encounter     Plan: Patient is a pleasant 66 year old woman who is now almost 1 month status post right proximal humerus fracture.  Overall she is doing well.  She is very hesitant to move her arm.  She has gotten some stiffness.  We will refer her to physical therapy to begin passive range of motion and progress as tolerated.  We are also going to follow-up on a bone density scan given her fractures and her history of tobacco use.  I have refilled her oxycodone.  She will follow-up in 2 weeks.  Follow-Up Instructions: 2 weeks  Orders:  Orders Placed This Encounter  Procedures   XR Humerus Right   No orders of the defined types were placed in this encounter.     Procedures: No procedures performed   Clinical Data: No additional findings.   Subjective: Chief Complaint  Patient presents with   Right Shoulder - Follow-up   Patient presents today for follow up of Closed fracture of proximal end of right humerus which occurred on 05/11/2022. Patient states that she has been having constant aching and sharp pains noticed with or without movement. She states that she is able to use her sling however is unable to put this back on once this is removed. Today she will have repeat xrays and would like to discuss how long fracture healing process takes.    Review of Systems  All other systems reviewed and are negative.    Objective: Vital Signs: There were no vitals taken for this visit.  Physical Exam Pulmonary:     Effort: Pulmonary effort is normal.  Skin:    General: Skin is warm  and dry.     Ortho Exam Examination of her right arm she has no swelling in her hand.  Hand is warm palpable radial pulse.  She can passively abduct to 90 degrees.  Still has quite a bit of tenderness with movement Specialty Comments:  No specialty comments available.  Imaging: No results found.   PMFS History: Patient Active Problem List   Diagnosis Date Noted   Humerus fracture 05/15/2022   PCP NOTES >>>>>>>>> 08/06/2015   Annual physical exam 07/09/2015   Rectal bleeding 06/29/2015   Underweight 10/30/2014   Snoring-- insomnia 07/22/2014   DJD -- wrist pain-- pain managment 06/24/2014   SMOKER 05/04/2010   COPD (chronic obstructive pulmonary disease) ([place]) 05/04/2010   Anxiety 02/22/2010   CARPAL TUNNEL SYNDROME, BILATERAL 02/22/2010   ALLERGIC RHINITIS 02/22/2010   Past Medical History:  Diagnosis Date   Allergy    SEASONAL   Anal fissure    Anxiety    Arthritis    Cataract    BILATERAL   COPD (chronic obstructive pulmonary disease) ([facility])    GERD (gastroesophageal reflux disease)    Hypertension    Migraines    h/o   Multiple gastric ulcers in the 70s   d/t BC powders   Pneumonia  Right calcaneal fracture 2017   ORIF in 12/2015   Tubular adenoma of colon 07/2015    Family History  Problem Relation Age of Onset   Hypertension Mother    Emphysema Mother    Hypertension Father    Skin cancer Father    Stroke Father    Colon cancer Maternal Grandmother    Colon polyps Paternal Aunt    Heart disease Paternal Grandfather    Breast cancer Neg Hx    CAD Neg Hx     Past Surgical History:  Procedure Laterality Date   ganglion cyst removal Left remotely    from wrist   ORIF CALCANEAL FRACTURE Right 12/30/2015    Dr. Mardelle Matte   RIGHT OOPHORECTOMY     TUBAL LIGATION     WRIST SURGERY Right 2015   fx   Social History   Occupational History   Occupation: retired-disability d/t anxiety-djd  Tobacco Use   Smoking status: Every Day    Packs/day: 0.25     Years: 32.00    Total pack years: 8.00    Types: Cigarettes   Smokeless tobacco: Never   Tobacco comments:    smoking again as of 09-2014   Substance and Sexual Activity   Alcohol use: Yes    Alcohol/week: 0.0 standard drinks of alcohol    Comment: rare   Drug use: No   Sexual activity: Not on file

## 2022-06-22 ENCOUNTER — Ambulatory Visit: Payer: Medicare Other | Admitting: Orthopaedic Surgery

## 2022-06-28 ENCOUNTER — Telehealth: Payer: Self-pay | Admitting: Radiology

## 2022-06-28 NOTE — Telephone Encounter (Signed)
Patient called triage line stating that she had received a call from our office but could not hear anyone talking to her. She thinks that this call was to reschedule her appointment that she had said she would call back to reschedule previously. Per her last office note, she was to follow up with Dr. Durward Fortes in two weeks. I explained to patient that he is out of the office, however, I could make her an appointment with Bevely Palmer. She wishes to see Dr. Durward Fortes this time if possible. Appointment scheduled for 07/13/2022 as patient requested afternoon appointment.   Kisha-sending to you in case you all were trying to reach her to schedule PT. Referral is in chart. Thanks.  [phone]

## 2022-06-29 ENCOUNTER — Ambulatory Visit: Payer: Medicare Other | Admitting: Rehabilitative and Restorative Service Providers"

## 2022-07-13 ENCOUNTER — Ambulatory Visit: Payer: Medicare Other | Admitting: Orthopaedic Surgery

## 2022-07-25 ENCOUNTER — Ambulatory Visit: Payer: Medicare Other | Admitting: Orthopaedic Surgery

## 2022-11-29 ENCOUNTER — Other Ambulatory Visit: Payer: Self-pay

## 2022-11-29 ENCOUNTER — Ambulatory Visit (AMBULATORY_SURGERY_CENTER): Payer: Self-pay

## 2022-11-29 VITALS — Ht 61.0 in | Wt 100.0 lb

## 2022-11-29 DIAGNOSIS — Z86010 Personal history of colonic polyps: Secondary | ICD-10-CM

## 2022-11-29 MED ORDER — NA SULFATE-K SULFATE-MG SULF 17.5-3.13-1.6 GM/177ML PO SOLN: 1.0000 | Freq: Once | ORAL | 0 refills | Status: AC

## 2022-11-29 NOTE — Progress Notes (Signed)
Denies allergies to eggs or soy products. Denies complication of anesthesia or sedation. Denies use of weight loss medication. Denies use of O2.   Emmi instructions given for colonoscopy.  

## 2022-12-29 ENCOUNTER — Encounter: Payer: Self-pay | Admitting: Gastroenterology

## 2023-01-02 ENCOUNTER — Encounter: Payer: Medicare Other | Admitting: Gastroenterology

## 2023-01-09 ENCOUNTER — Telehealth: Payer: Self-pay | Admitting: Gastroenterology

## 2023-01-09 NOTE — Telephone Encounter (Signed)
Agree with plan for an office visit as scheduled to evaluate need for EGD and/or other evaluation

## 2023-01-09 NOTE — Telephone Encounter (Signed)
Good afternoon Dr. Fuller Plan,  Patient called stating she wanted move her colonoscopy date later than 2/22 due to wanting to add a endoscopy. I advised patient she would need to come in for an office visit first.   Patients colonoscopy was canceled and patient was scheduled for an office visit on 3/27 at 23:30 with Colleen.

## 2023-01-11 ENCOUNTER — Encounter: Payer: Medicare Other | Admitting: Gastroenterology

## 2023-02-14 ENCOUNTER — Ambulatory Visit: Payer: Medicare Other | Admitting: Nurse Practitioner

## 2023-03-30 ENCOUNTER — Encounter: Payer: Self-pay | Admitting: Physician Assistant

## 2023-03-30 ENCOUNTER — Ambulatory Visit: Payer: Medicare Other | Admitting: Physician Assistant

## 2023-03-30 VITALS — BP 136/82 | HR 95 | Ht 61.0 in | Wt 106.0 lb

## 2023-03-30 DIAGNOSIS — K219 Gastro-esophageal reflux disease without esophagitis: Secondary | ICD-10-CM | POA: Diagnosis not present

## 2023-03-30 DIAGNOSIS — R1310 Dysphagia, unspecified: Secondary | ICD-10-CM | POA: Diagnosis not present

## 2023-03-30 DIAGNOSIS — Z86010 Personal history of colonic polyps: Secondary | ICD-10-CM

## 2023-03-30 MED ORDER — OMEPRAZOLE 40 MG PO CPDR: 40.0000 mg | DELAYED_RELEASE_CAPSULE | Freq: Two times a day (BID) | ORAL | 11 refills | Status: DC

## 2023-03-30 MED ORDER — NA SULFATE-K SULFATE-MG SULF 17.5-3.13-1.6 GM/177ML PO SOLN: ORAL | 0 refills | Status: DC

## 2023-03-30 NOTE — Patient Instructions (Signed)
You have been scheduled for a Barium Esophogram at [facility] (1st floor of the hospital) on 04/23/23 at 10 am. Please arrive 30 minutes prior to your appointment for registration. Make certain not to have anything to eat or drink 3 hours prior to your test. If you need to reschedule for any reason, please contact radiology at [phone] to do so. __________________________________________________________________ A barium swallow is an examination that concentrates on views of the esophagus. This tends to be a double contrast exam (barium and two liquids which, when combined, create a gas to distend the wall of the oesophagus) or single contrast (non-ionic iodine based). The study is usually tailored to your symptoms so a good history is essential. Attention is paid during the study to the form, structure and configuration of the esophagus, looking for functional disorders (such as aspiration, dysphagia, achalasia, motility and reflux) EXAMINATION You may be asked to change into a gown, depending on the type of swallow being performed. A radiologist and radiographer will perform the procedure. The radiologist will advise you of the type of contrast selected for your procedure and direct you during the exam. You will be asked to stand, sit or lie in several different positions and to hold a small amount of fluid in your mouth before being asked to swallow while the imaging is performed .In some instances you may be asked to swallow barium coated marshmallows to assess the motility of a solid food bolus. The exam can be recorded as a digital or video fluoroscopy procedure. POST PROCEDURE It will take 1-2 days for the barium to pass through your system. To facilitate this, it is important, unless otherwise directed, to increase your fluids for the next 24-48hrs and to resume your normal diet.  This test typically takes about 30 minutes to  perform. __________________________________________________________________________________   Casey Schmidt have been scheduled for an endoscopy and colonoscopy. Please follow the written instructions given to you at your visit today. Please pick up your prep supplies at the pharmacy within the next 1-3 days. If you use inhalers (even only as needed), please bring them with you on the day of your procedure.   We have sent the following medications to your pharmacy for you to pick up at your convenience: Suprep  If your blood pressure at your visit was 140/90 or greater, please contact your primary care physician to follow up on this.  _______________________________________________________  If you are age 80 or older, your body mass index should be between 23-30. Your Body mass index is 20.03 kg/m. If this is out of the aforementioned range listed, please consider follow up with your Primary Care Provider.  If you are age 29 or younger, your body mass index should be between 19-25. Your Body mass index is 20.03 kg/m. If this is out of the aformentioned range listed, please consider follow up with your Primary Care Provider.   ________________________________________________________  The [facility] providers would like to encourage you to use [facility] to communicate with providers for non-urgent requests or questions.  Due to long hold times on the telephone, sending your provider a message by [facility] may be a faster and more efficient way to get a response.  Please allow 48 business hours for a response.  Please remember that this is for non-urgent requests.  _______________________________________________________   Due to recent changes in healthcare laws, you may see the results of your imaging and laboratory studies on MyChart before your provider has had a chance to review  them.  We understand that in some cases there may be results that are confusing or concerning to you. Not all laboratory results  come back in the same time frame and the provider may be waiting for multiple results in order to interpret others.  Please give [place] 48 hours in order for your provider to thoroughly review all the results before contacting the office for clarification of your results.    Thank you for entrusting me with your care and choosing [facility].  Hyacinth Meeker PA-C

## 2023-03-30 NOTE — Progress Notes (Signed)
Chief Complaint: Discuss EGD and colonoscopy  HPI:    Casey Schmidt is a 67 year old female with a past medical history as listed below including COPD and GERD, known to Dr. Russella Dar, who presents to clinic today to discuss an EGD and colonoscopy.    03/20/2017 colonoscopy for history of adenomatous polyps with three 6-9 mm polyps in the sigmoid, transverse and cecum.  Post polypectomy scar and tattoo in the rectum.  Repeat recommended in 3 years.    01/09/2023 patient was previously scheduled for surveillance colonoscopy but this was canceled as patient also wanted to discuss an EGD.  She was set up for today's appointment.    03/29/2023 CBC normal.    Today, patient tells me she knows she is due for colonoscopy but wanted to discuss some trouble swallowing and a "burning" in her throat which has been occurring more and more and getting worse and worse over the past 6 months.  Tells me occasionally when she is eating she actually gets food stuck in her throat and then when she eventually swallows it down it is very "painful".  She has been on Omeprazole 40 mg daily for years and occasionally uses an over-the-counter Zantac for breakthrough symptoms.    Denies fever, chills, weight loss or abdominal pain.  Past Medical History:  Diagnosis Date   Allergy    SEASONAL   Anal fissure    Anxiety    Arthritis    Asthma    Cataract    BILATERAL   COPD (chronic obstructive pulmonary disease) (HCC)    Emphysema of lung (HCC)    GERD (gastroesophageal reflux disease)    Hypertension    Migraines    h/o   Multiple gastric ulcers in the 70s   d/t BC powders   Pneumonia    Right calcaneal fracture 2017   ORIF in 12/2015   Tubular adenoma of colon 07/2015    Past Surgical History:  Procedure Laterality Date   ganglion cyst removal Left remotely    from wrist   ORIF CALCANEAL FRACTURE Right 12/30/2015   Dr. Dion Saucier   RIGHT OOPHORECTOMY     SHOULDER SURGERY Left    TUBAL LIGATION     WRIST SURGERY  Right 11/20/2013   fx    Current Outpatient Medications  Medication Sig Dispense Refill   albuterol (PROAIR HFA) 108 (90 Base) MCG/ACT inhaler Inhale 2 puffs into the lungs every 4 (four) hours as needed for wheezing or shortness of breath. 18 g 5   ALPRAZolam (XANAX) 0.5 MG tablet Take 1 tablet (0.5 mg total) by mouth 4 (four) times daily as needed for anxiety. 80 tablet 0   ALPRAZolam (XANAX) 0.5 MG tablet TAKE 1 TABLET BY MOUTH TWICE A DAY AS NEEDED FOR ANXIETY     amphetamine-dextroamphetamine (ADDERALL XR) 20 MG 24 hr capsule Take 20 mg by mouth 2 (two) times daily.     cetirizine (ZYRTEC) 5 MG chewable tablet Chew 5 mg by mouth daily.     Cholecalciferol (VITAMIN D PO) Take 1 tablet by mouth daily.     cyclobenzaprine (FLEXERIL) 10 MG tablet Take 1 tablet (10 mg total) by mouth 2 (two) times daily as needed for muscle spasms. (Patient not taking: Reported on 11/29/2022) 20 tablet 0   Fluticasone-Salmeterol (ADVAIR) 250-50 MCG/DOSE AEPB Inhale 1 puff into the lungs 2 (two) times daily. (Patient not taking: Reported on 11/29/2022) 60 each 0   methadone (DOLOPHINE) 10 MG/5ML solution Take 55 mg by mouth  daily. (Patient not taking: Reported on 11/29/2022)     Multiple Vitamin (MULTIVITAMIN) tablet Take 1 tablet by mouth daily.     OVER THE COUNTER MEDICATION Vitamin D 3. Once daily.     ranitidine (ZANTAC) 300 MG tablet Take 1 tablet (300 mg total) by mouth at bedtime. 30 tablet 3   SYMBICORT 160-4.5 MCG/ACT inhaler Inhale 2 puffs into the lungs 2 (two) times daily.  5   Current Facility-Administered Medications  Medication Dose Route Frequency Provider Last Rate Last Admin   0.9 %  sodium chloride infusion  500 mL Intravenous Continuous Meryl Dare, MD       ipratropium-albuterol (DUONEB) 0.5-2.5 (3) MG/3ML nebulizer solution 3 mL  3 mL Nebulization Q6H Saguier, Edward, PA-C   3 mL at 08/16/16 1520    Allergies as of 03/30/2023 - Review Complete 11/29/2022  Allergen Reaction Noted    Amoxicillin Other (See Comments) 06/29/2015   Aspirin Other (See Comments) 02/25/2014   Clarithromycin  02/22/2010   Codeine     Erythromycin     Nsaids     Tolmetin Other (See Comments)    Tramadol Other (See Comments) 06/24/2014    Family History  Problem Relation Age of Onset   Hypertension Mother    Emphysema Mother    Hypertension Father    Skin cancer Father    Stroke Father    Colon polyps Paternal Aunt    Colon cancer Maternal Grandmother    Heart disease Paternal Grandfather    Breast cancer Neg Hx    CAD Neg Hx    Esophageal cancer Neg Hx    Stomach cancer Neg Hx    Rectal cancer Neg Hx     Social History   Socioeconomic History   Marital status: Widowed    Spouse name: Not on file   Number of children: 2   Years of education: Not on file   Highest education level: Not on file  Occupational History   Occupation: retired-disability d/t anxiety-djd  Tobacco Use   Smoking status: Former    Packs/day: 0.25    Years: 32.00    Additional pack years: 0.00    Total pack years: 8.00    Types: Cigarettes   Smokeless tobacco: Current   Tobacco comments:    smoking again as of 09-2014 Patient states she Vapes.   Substance and Sexual Activity   Alcohol use: Yes    Comment: socially   Drug use: No   Sexual activity: Not on file  Other Topics Concern   Not on file  Social History Narrative   Lost husband 56, son lives w/ her, has a boyfriend   Social Determinants of Corporate investment banker Strain: Not on file  Food Insecurity: Not on file  Transportation Needs: Not on file  Physical Activity: Not on file  Stress: Not on file  Social Connections: Not on file  Intimate Partner Violence: Not on file    Review of Systems:    Constitutional: No weight loss, fever or chills Skin: No rash  Cardiovascular: No chest pain Respiratory: No SOB  Gastrointestinal: See HPI and otherwise negative Genitourinary: No dysuria Neurological: No headache,  dizziness or syncope Musculoskeletal: No new muscle or joint pain Hematologic: No bleeding  Psychiatric: No history of depression or anxiety   Physical Exam:  Vital signs: BP 136/82   Pulse 95   Ht 5\' 1"  (1.549 m)   Wt 106 lb (48.1 kg)   BMI 20.03 kg/m  Constitutional:   Pleasant thin appearing Caucasian female appears to be in NAD, Well developed, Well nourished, alert and cooperative Head:  Normocephalic and atraumatic. Eyes:   PEERL, EOMI. No icterus. Conjunctiva pink. Ears:  Normal auditory acuity. Neck:  Supple Throat: Oral cavity and pharynx without inflammation, swelling or lesion.  Respiratory: Respirations even and unlabored. Lungs clear to auscultation bilaterally.   No wheezes, crackles, or rhonchi.  Cardiovascular: Normal S1, S2. No MRG. Regular rate and rhythm. No peripheral edema, cyanosis or pallor.  Gastrointestinal:  Soft, nondistended, nontender. No rebound or guarding. Normal bowel sounds. No appreciable masses or hepatomegaly. Rectal:  Not performed.  Msk:  Symmetrical without gross deformities. Without edema, no deformity or joint abnormality.  Neurologic:  Alert and  oriented x4;  grossly normal neurologically.  Skin:   Dry and intact without significant lesions or rashes. Psychiatric: Demonstrates good judgement and reason without abnormal affect or behaviors.  RELEVANT LABS AND IMAGING: CBC    Component Value Date/Time   WBC 13.5 (H) 08/25/2016 1519   RBC 4.41 08/25/2016 1519   HGB 14.3 08/25/2016 1519   HCT 42.7 08/25/2016 1519   PLT 290 08/25/2016 1519   MCV 96.8 08/25/2016 1519   MCH 32.4 08/25/2016 1519   MCHC 33.5 08/25/2016 1519   RDW 13.6 08/25/2016 1519   LYMPHSABS 4,050 (H) 08/25/2016 1519   MONOABS 675 08/25/2016 1519   EOSABS 270 08/25/2016 1519   BASOSABS 0 08/25/2016 1519    CMP     Component Value Date/Time   NA 134 (L) 08/25/2016 1519   K 4.6 08/25/2016 1519   CL 96 (L) 08/25/2016 1519   CO2 29 08/25/2016 1519   GLUCOSE  72 08/25/2016 1519   BUN 13 08/25/2016 1519   CREATININE 0.93 08/25/2016 1519   CALCIUM 8.8 08/25/2016 1519   PROT 6.5 08/25/2016 1519   ALBUMIN 3.9 08/25/2016 1519   AST 18 08/25/2016 1519   ALT 14 08/25/2016 1519   ALKPHOS 105 08/25/2016 1519   BILITOT 0.2 08/25/2016 1519    Assessment: 1.  History of adenomatous polyps: Patient is overdue for repeat colonoscopy given her history of adenomatous polyps 2.  Dysphagia: Increased trouble with food getting stuck in her throat as she is swallowing as well as some esophageal pain and what sounds like reflux regardless of Omeprazole 40 mg daily which she has been on for years; likely esophageal stricture versus dysmotility+/- esophagitis 3.  GERD and esophageal pain  Plan: 1.  Scheduled patient for an EGD with dilation and surveillance colonoscopy in the [facility] with Dr. Russella Dar at his next available.  Did provide the patient a detailed list of risks for the procedures and she agrees to proceed. Patient is appropriate for endoscopic procedure(s) in the ambulatory ([facility]) setting.  2.  Micah Flesher ahead and scheduled patient for barium esophagram with tablet prior to EGD for further evaluation of dysphagia.  Pending results could consider canceling EGD if necessary. 3.  Increased Omeprazole to 40 mg twice daily, 30-60 minutes for breakfast and dinner.  #60 with 5 refills. 4.  Discussed with patient that Zantac has been recalled.  Recommend she discontinue this.  If she needs something for breakthrough symptoms recommend over-the-counter Pepcid 20 mg as needed 5.  Patient to follow in clinic per recommendations from Dr. Russella Dar after time of procedures.  Hyacinth Meeker, PA-C [facility] 03/30/2023, 2:13 PM  Cc: Judd Lien, PA-C

## 2023-04-23 ENCOUNTER — Ambulatory Visit (HOSPITAL_COMMUNITY)
Admission: RE | Admit: 2023-04-23 | Discharge: 2023-04-23 | Disposition: A | Discharge status: Home / Self Care | Payer: Medicare Other | Source: Ambulatory Visit | Attending: Physician Assistant | Admitting: Physician Assistant

## 2023-04-23 DIAGNOSIS — R1310 Dysphagia, unspecified: Secondary | ICD-10-CM

## 2023-04-23 DIAGNOSIS — K219 Gastro-esophageal reflux disease without esophagitis: Secondary | ICD-10-CM

## 2023-04-23 DIAGNOSIS — Z86010 Personal history of colonic polyps: Secondary | ICD-10-CM | POA: Diagnosis present

## 2023-05-10 ENCOUNTER — Encounter: Payer: Self-pay | Admitting: Certified Registered Nurse Anesthetist

## 2023-05-15 ENCOUNTER — Ambulatory Visit (AMBULATORY_SURGERY_CENTER): Payer: Medicare Other | Admitting: Gastroenterology

## 2023-05-15 ENCOUNTER — Encounter: Payer: Self-pay | Admitting: Gastroenterology

## 2023-05-15 VITALS — BP 139/76 | HR 96 | Temp 98.9°F | Resp 12 | Ht 61.0 in | Wt 106.0 lb

## 2023-05-15 DIAGNOSIS — K222 Esophageal obstruction: Secondary | ICD-10-CM | POA: Diagnosis not present

## 2023-05-15 DIAGNOSIS — B3781 Candidal esophagitis: Secondary | ICD-10-CM

## 2023-05-15 DIAGNOSIS — R933 Abnormal findings on diagnostic imaging of other parts of digestive tract: Secondary | ICD-10-CM

## 2023-05-15 DIAGNOSIS — Z09 Encounter for follow-up examination after completed treatment for conditions other than malignant neoplasm: Secondary | ICD-10-CM

## 2023-05-15 DIAGNOSIS — K3189 Other diseases of stomach and duodenum: Secondary | ICD-10-CM | POA: Diagnosis not present

## 2023-05-15 DIAGNOSIS — K219 Gastro-esophageal reflux disease without esophagitis: Secondary | ICD-10-CM

## 2023-05-15 DIAGNOSIS — Z86010 Personal history of colonic polyps: Secondary | ICD-10-CM | POA: Diagnosis not present

## 2023-05-15 DIAGNOSIS — R1310 Dysphagia, unspecified: Secondary | ICD-10-CM

## 2023-05-15 MED ORDER — FLUCONAZOLE 100 MG PO TABS
100.0000 mg | ORAL_TABLET | Freq: Every day | ORAL | 0 refills | Status: AC
Start: 2023-05-15 — End: 2023-05-25

## 2023-05-15 MED ORDER — SODIUM CHLORIDE 0.9 % IV SOLN
500.0000 mL | Freq: Once | INTRAVENOUS | Status: DC
Start: 2023-05-15 — End: 2023-05-15

## 2023-05-15 NOTE — Op Note (Signed)
[facility] Patient Name: Casey Schmidt Procedure Date: 05/15/2023 4:43 PM MRN: [id number] Endoscopist: Meryl Dare , MD, [phone] Age: 67 Referring MD:  Date of Birth: 07/17/1956 Gender: Female Account #: [id number] Procedure:                Colonoscopy Indications:              Surveillance: Personal history of adenomatous                            polyps on last colonoscopy 5 years ago Medicines:                Monitored Anesthesia Care Procedure:                Pre-Anesthesia Assessment:                           - Prior to the procedure, a History and Physical                            was performed, and patient medications and                            allergies were reviewed. The patient's tolerance of                            previous anesthesia was also reviewed. The risks                            and benefits of the procedure and the sedation                            options and risks were discussed with the patient.                            All questions were answered, and informed consent                            was obtained. Prior Anticoagulants: The patient has                            taken no anticoagulant or antiplatelet agents. ASA                            Grade Assessment: III - A patient with severe                            systemic disease. After reviewing the risks and                            benefits, the patient was deemed in satisfactory                            condition to undergo the procedure.  After obtaining informed consent, the colonoscope                            was passed under direct vision. Throughout the                            procedure, the patient's blood pressure, pulse, and                            oxygen saturations were monitored continuously. The                            Olympus [id number]-[id number] (#[id number]) Colonoscope was                            introduced through  the anus and advanced to the the                            cecum, identified by appendiceal orifice and                            ileocecal valve. The ileocecal valve, appendiceal                            orifice, and rectum were photographed. The quality                            of the bowel preparation was adequate after                            extensive lavage, suction. The colonoscopy was                            performed without difficulty. The patient tolerated                            the procedure well. Technical problems with server                            so Provation was not available during the                            procedure: no photos Scope In: 11:40:44 PM Scope Out: 11:54:35 PM Scope Withdrawal Time: 0 hours 11 minutes 36 seconds  Total Procedure Duration: 0 hours 13 minutes 51 seconds  Findings:                 The perianal and digital rectal examinations were                            normal.                           A tattoo was seen in the rectum. A post-polypectomy  scar was found at the tattoo site.                           The exam was otherwise without abnormality on                            direct and retroflexion views. Complications:            No immediate complications. Estimated blood loss:                            None. Estimated Blood Loss:     Estimated blood loss: none. Impression:               - A tattoo was seen in the rectum. A                            post-polypectomy scar was found at the tattoo site.                           - The examination was otherwise normal on direct                            and retroflexion views.                           - No specimens collected. Recommendation:           - Repeat colonoscopy in 5 years for surveillance                            with a more extensive bowel prep.                           - Patient has a contact number available for                             emergencies. The signs and symptoms of potential                            delayed complications were discussed with the                            patient. Return to normal activities tomorrow.                            Written discharge instructions were provided to the                            patient.                           - Resume previous diet.                           - Continue present medications. Meryl Dare, MD 05/15/2023 4:55:03 PM This report has  been signed electronically.

## 2023-05-15 NOTE — Op Note (Signed)
[facility] Patient Name: Casey Schmidt Procedure Date: 05/15/2023 4:43 PM MRN: [id number] Endoscopist: Meryl Dare , MD, [phone] Age: 67 Referring MD:  Date of Birth: 10-10-1956 Gender: Female Account #: [id number] Procedure:                Upper GI endoscopy Indications:              Dysphagia, Odynophagia, Abnormal esophagram Medicines:                Monitored Anesthesia Care Procedure:                Pre-Anesthesia Assessment:                           - Prior to the procedure, a History and Physical                            was performed, and patient medications and                            allergies were reviewed. The patient's tolerance of                            previous anesthesia was also reviewed. The risks                            and benefits of the procedure and the sedation                            options and risks were discussed with the patient.                            All questions were answered, and informed consent                            was obtained. Prior Anticoagulants: The patient has                            taken no anticoagulant or antiplatelet agents. ASA                            Grade Assessment: III - A patient with severe                            systemic disease. After reviewing the risks and                            benefits, the patient was deemed in satisfactory                            condition to undergo the procedure.                           After obtaining informed consent, the endoscope was  passed under direct vision. Throughout the                            procedure, the patient's blood pressure, pulse, and                            oxygen saturations were monitored continuously. The                            Olympus Scope [id number] was introduced through the                            mouth, and advanced to the second part of duodenum.                             The upper GI endoscopy was accomplished without                            difficulty. The patient tolerated the procedure                            well. Techical problems with server so Provation                            was not available during the procedures: no photos. Scope In: 12:00:22 PM Scope Out: 12:09:34 PM Total Procedure Duration: 0 hours 9 minutes 12 seconds  Findings:                 Diffuse, white plaques were found in the proximal                            esophagus and in the mid esophagus. Biopsies were                            taken with a cold forceps for histology.                           One benign-appearing, intrinsic moderate stenosis                            was found at the gastroesophageal junction. This                            stenosis measured 1.3 cm (inner diameter) x less                            than one cm (in length). The stenosis was                            traversed. A guidewire was placed and the scope was                            withdrawn. Dilation was performed with a Savary  dilator with mild resistance at 16 mm.                           The exam of the esophagus was otherwise normal.                           Patchy mildly erythematous mucosa without bleeding                            was found in the gastric body and in the gastric                            antrum. Biopsies were taken with a cold forceps for                            histology.                           The exam of the stomach was otherwise normal.                           The duodenal bulb and second portion of the                            duodenum were normal. Complications:            No immediate complications. Estimated Blood Loss:     Estimated blood loss was minimal. Impression:               - Esophageal plaques were found, consistent with                            candidiasis. Biopsied.                           -  Benign-appearing esophageal stenosis. Dilated.                           - Erythematous mucosa in the gastric body and                            antrum. Biopsied.                           - Normal duodenal bulb and second portion of the                            duodenum. Recommendation:           - Patient has a contact number available for                            emergencies. The signs and symptoms of potential                            delayed complications were discussed with the  patient. Return to normal activities tomorrow.                            Written discharge instructions were provided to the                            patient.                           - Clear liquid diet for 2 hours, then advance as                            tolerated to soft diet today.                           - Resume prior diet tomorrow.                           - Continue present medications.                           - Diflucan 100 mg po qd, #10.                           - Await pathology results. Meryl Dare, MD 05/15/2023 4:49:42 PM This report has been signed electronically.

## 2023-05-15 NOTE — Progress Notes (Signed)
History & Physical  Primary Care Physician:  Judd Lien, PA-C Primary Gastroenterologist: Claudette Head, MD  Impression / Plan:  Personal history of adenomatous colon polyps, GERD, dysphagia, odynophagia for colonoscopy and EGD with possible dilation.  CHIEF COMPLAINT: GERD, dysphagia, odynophagia, personal history of colon polyps   HPI: Casey Schmidt is a 66 y.o. female a personal history of adenomatous colon polyps, GERD, dysphagia, odynophagia for colonoscopy and EGD with possible dilation.   Past Medical History:  Diagnosis Date   Allergy    SEASONAL   Anal fissure    Anxiety    Arthritis    Asthma    Cataract    BILATERAL   COPD (chronic obstructive pulmonary disease) (HCC)    Emphysema of lung (HCC)    GERD (gastroesophageal reflux disease)    Hypertension    Migraines    h/o   Multiple gastric ulcers in the 70s   d/t BC powders   Pneumonia    Right calcaneal fracture 2017   ORIF in 12/2015   Tubular adenoma of colon 07/2015    Past Surgical History:  Procedure Laterality Date   ganglion cyst removal Left remotely    from wrist   ORIF CALCANEAL FRACTURE Right 12/30/2015   Dr. Dion Saucier   RIGHT OOPHORECTOMY     SHOULDER SURGERY Left    TUBAL LIGATION     WRIST SURGERY Right 11/20/2013   fx    Prior to Admission medications   Medication Sig Start Date End Date Taking? Authorizing Provider  albuterol (PROAIR HFA) 108 (90 Base) MCG/ACT inhaler Inhale 2 puffs into the lungs every 4 (four) hours as needed for wheezing or shortness of breath. 07/25/16  Yes Paz, Nolon Rod, MD  ALPRAZolam Prudy Feeler) 0.5 MG tablet Take 1 tablet (0.5 mg total) by mouth 4 (four) times daily as needed for anxiety. 10/31/16  Yes Wanda Plump, MD  ALPRAZolam Prudy Feeler) 0.5 MG tablet  05/02/17  Yes [provider]  amphetamine-dextroamphetamine (ADDERALL XR) 20 MG 24 hr capsule Take 20 mg by mouth 2 (two) times daily.   Yes [provider]  cetirizine (ZYRTEC) 5 MG chewable  tablet Chew 5 mg by mouth daily.   Yes [provider]  Cholecalciferol (VITAMIN D PO) Take 1 tablet by mouth daily.   Yes [provider]  Fluticasone-Salmeterol (ADVAIR) 250-50 MCG/DOSE AEPB Inhale 1 puff into the lungs 2 (two) times daily. 08/16/16  Yes Saguier, Ramon Dredge, PA-C  methadone (DOLOPHINE) 10 MG/5ML solution Take 55 mg by mouth daily.   Yes [provider]  Multiple Vitamin (MULTIVITAMIN) tablet Take 1 tablet by mouth daily.   Yes [provider]  omeprazole (PRILOSEC) 40 MG capsule Take 1 capsule (40 mg total) by mouth 2 (two) times daily. 03/30/23  Yes Unk Lightning, PA  SYMBICORT 160-4.5 MCG/ACT inhaler Inhale 2 puffs into the lungs 2 (two) times daily. 07/27/17  Yes [provider]  cyclobenzaprine (FLEXERIL) 10 MG tablet Take 1 tablet (10 mg total) by mouth 2 (two) times daily as needed for muscle spasms. Patient not taking: Reported on 03/30/2023 09/14/17   Roxy Horseman, PA-C  OVER THE COUNTER MEDICATION Vitamin D 3. Once daily.    [provider]  ranitidine (ZANTAC) 300 MG tablet Take 1 tablet (300 mg total) by mouth at bedtime. Patient not taking: Reported on 05/15/2023 07/04/16   Wanda Plump, MD    Current Outpatient Medications  Medication Sig Dispense Refill   albuterol [facility] HFA)  108 (90 Base) MCG/ACT inhaler Inhale 2 puffs into the lungs every 4 (four) hours as needed for wheezing or shortness of breath. 18 g 5   ALPRAZolam (XANAX) 0.5 MG tablet Take 1 tablet (0.5 mg total) by mouth 4 (four) times daily as needed for anxiety. 80 tablet 0   ALPRAZolam (XANAX) 0.5 MG tablet      amphetamine-dextroamphetamine (ADDERALL XR) 20 MG 24 hr capsule Take 20 mg by mouth 2 (two) times daily.     cetirizine (ZYRTEC) 5 MG chewable tablet Chew 5 mg by mouth daily.     Cholecalciferol (VITAMIN D PO) Take 1 tablet by mouth daily.     Fluticasone-Salmeterol (ADVAIR) 250-50 MCG/DOSE AEPB Inhale 1 puff into the lungs 2 (two)  times daily. 60 each 0   methadone (DOLOPHINE) 10 MG/5ML solution Take 55 mg by mouth daily.     Multiple Vitamin (MULTIVITAMIN) tablet Take 1 tablet by mouth daily.     omeprazole (PRILOSEC) 40 MG capsule Take 1 capsule (40 mg total) by mouth 2 (two) times daily. 60 capsule 11   SYMBICORT 160-4.5 MCG/ACT inhaler Inhale 2 puffs into the lungs 2 (two) times daily.  5   cyclobenzaprine (FLEXERIL) 10 MG tablet Take 1 tablet (10 mg total) by mouth 2 (two) times daily as needed for muscle spasms. (Patient not taking: Reported on 03/30/2023) 20 tablet 0   OVER THE COUNTER MEDICATION Vitamin D 3. Once daily.     ranitidine (ZANTAC) 300 MG tablet Take 1 tablet (300 mg total) by mouth at bedtime. (Patient not taking: Reported on 05/15/2023) 30 tablet 3   Current Facility-Administered Medications  Medication Dose Route Frequency Provider Last Rate Last Admin   0.9 %  sodium chloride infusion  500 mL Intravenous Continuous Meryl Dare, MD       0.9 %  sodium chloride infusion  500 mL Intravenous Once Meryl Dare, MD       ipratropium-albuterol (DUONEB) 0.5-2.5 (3) MG/3ML nebulizer solution 3 mL  3 mL Nebulization Q6H Saguier, Edward, PA-C   3 mL at 08/16/16 1520    Allergies as of 05/15/2023 - Review Complete 05/15/2023  Allergen Reaction Noted   Amoxicillin Other (See Comments) 06/29/2015   Aspirin Other (See Comments) 02/25/2014   Clarithromycin  02/22/2010   Codeine     Erythromycin     Nsaids     Tolmetin Other (See Comments)    Tramadol Other (See Comments) 06/24/2014    Family History  Problem Relation Age of Onset   Hypertension Mother    Emphysema Mother    Hypertension Father    Skin cancer Father    Stroke Father    Colon polyps Paternal Aunt    Colon cancer Maternal Grandmother    Heart disease Paternal Grandfather    Breast cancer Neg Hx    CAD Neg Hx    Esophageal cancer Neg Hx    Stomach cancer Neg Hx    Rectal cancer Neg Hx     Social History   Socioeconomic  History   Marital status: Widowed    Spouse name: Not on file   Number of children: 2   Years of education: Not on file   Highest education level: Not on file  Occupational History   Occupation: retired-disability d/t anxiety-djd  Tobacco Use   Smoking status: Former    Packs/day: 0.25    Years: 32.00    Additional pack years: 0.00    Total pack years: 8.00  Types: Cigarettes   Smokeless tobacco: Current   Tobacco comments:    smoking again as of 09-2014 Patient states she Vapes.   Substance and Sexual Activity   Alcohol use: Yes    Comment: socially   Drug use: No   Sexual activity: Not on file  Other Topics Concern   Not on file  Social History Narrative   Lost husband 2014, son lives w/ her, has a boyfriend   Social Determinants of Corporate investment banker Strain: Not on file  Food Insecurity: Not on file  Transportation Needs: Not on file  Physical Activity: Not on file  Stress: Not on file  Social Connections: Not on file  Intimate Partner Violence: Not on file    Review of Systems:  All systems reviewed were negative except where noted in HPI.   Physical Exam:  General:  Alert, well-developed, in NAD Head:  Normocephalic and atraumatic. Eyes:  Sclera clear, no icterus.   Conjunctiva pink. Ears:  Normal auditory acuity. Mouth:  No deformity or lesions.  Neck:  Supple; no masses. Lungs:  Clear throughout to auscultation.   No wheezes, crackles, or rhonchi.  Heart:  Regular rate and rhythm; no murmurs. Abdomen:  Soft, nondistended, nontender. No masses, hepatomegaly. No palpable masses.  Normal bowel sounds.    Rectal:  Deferred   Msk:  Symmetrical without gross deformities. Extremities:  Without edema. Neurologic:  Alert and  oriented x 4; grossly normal neurologically. Skin:  Intact without significant lesions or rashes. Psych:  Alert and cooperative. Normal mood and affect.  Venita Lick. Russella Dar  05/15/2023, 11:14 AM See Loretha Stapler, [facility], to  contact our on call provider

## 2023-05-15 NOTE — Progress Notes (Signed)
Called to room to assist during endoscopic procedure.  Patient ID and intended procedure confirmed with present staff. Received instructions for my participation in the procedure from the performing physician.  

## 2023-05-15 NOTE — Patient Instructions (Signed)
Clear liquids until 2:20, soft foods for the rest of today. Tomorrow you may resume normal diet. Pick up medication.     YOU HAD AN ENDOSCOPIC PROCEDURE TODAY AT THE  ENDOSCOPY CENTER:   Refer to the procedure report that was given to you for any specific questions about what was found during the examination.  If the procedure report does not answer your questions, please call your gastroenterologist to clarify.  If you requested that your care partner not be given the details of your procedure findings, then the procedure report has been included in a sealed envelope for you to review at your convenience later.  YOU SHOULD EXPECT: Some feelings of bloating in the abdomen. Passage of more gas than usual.  Walking can help get rid of the air that was put into your GI tract during the procedure and reduce the bloating. If you had a lower endoscopy (such as a colonoscopy or flexible sigmoidoscopy) you may notice spotting of blood in your stool or on the toilet paper. If you underwent a bowel prep for your procedure, you may not have a normal bowel movement for a few days.  Please Note:  You might notice some irritation and congestion in your nose or some drainage.  This is from the oxygen used during your procedure.  There is no need for concern and it should clear up in a day or so.  SYMPTOMS TO REPORT IMMEDIATELY:  Following lower endoscopy (colonoscopy or flexible sigmoidoscopy):  Excessive amounts of blood in the stool  Significant tenderness or worsening of abdominal pains  Swelling of the abdomen that is new, acute  Fever of 100F or higher  Following upper endoscopy (EGD)  Vomiting of blood or coffee ground material  New chest pain or pain under the shoulder blades  Painful or persistently difficult swallowing  New shortness of breath  Fever of 100F or higher  Black, tarry-looking stools  For urgent or emergent issues, a gastroenterologist can be reached at any hour by calling  [phone]. Do not use MyChart messaging for urgent concerns.    DIET: Clear liquids until 2:20, soft foods for the rest of today. Tommorrow you may proceed to your regular diet.  Drink plenty of fluids but you should avoid alcoholic beverages for 24 hours.  ACTIVITY:  You should plan to take it easy for the rest of today and you should NOT DRIVE or use heavy machinery until tomorrow (because of the sedation medicines used during the test).    FOLLOW UP: Our staff will call the number listed on your records the next business day following your procedure.  We will call around 7:15- 8:00 am to check on you and address any questions or concerns that you may have regarding the information given to you following your procedure. If we do not reach you, we will leave a message.     If any biopsies were taken you will be contacted by phone or by letter within the next 1-3 weeks.  Please call us at [phone] if you have not heard about the biopsies in 3 weeks.    SIGNATURES/CONFIDENTIALITY: You and/or your care partner have signed paperwork which will be entered into your electronic medical record.  These signatures attest to the fact that that the information above on your After Visit Summary has been reviewed and is understood.  Full responsibility of the confidentiality of this discharge information lies with you and/or your care-partner.

## 2023-05-15 NOTE — Progress Notes (Signed)
Report given to PACU, vss 

## 2023-05-15 NOTE — Progress Notes (Signed)
1132 Robinul 0.1 mg IV given due large amount of secretions upon assessment.  MD made aware, vss   Data will not transfer from monitor, vs being posted manually.

## 2023-05-16 ENCOUNTER — Telehealth: Payer: Self-pay | Admitting: *Deleted

## 2023-05-16 NOTE — Telephone Encounter (Signed)
  Follow up Call-    Row Labels 05/15/2023   11:06 AM  Call back number   Section Header. No data exists in this row.   Post procedure Call Back phone  #   [phone]  Permission to leave phone message   Yes     Patient questions:  Do you have a fever, pain , or abdominal swelling? No. Pain Score  0 *  Have you tolerated food without any problems? Yes.    Have you been able to return to your normal activities? Yes.    Do you have any questions about your discharge instructions: Diet   No. Medications  No. Follow up visit  No.  Do you have questions or concerns about your Care? No.  Actions: * If pain score is 4 or above: No action needed, pain <4.

## 2023-05-27 ENCOUNTER — Encounter: Payer: Self-pay | Admitting: Gastroenterology

## 2024-08-16 ENCOUNTER — Ambulatory Visit
Admission: EM | Admit: 2024-08-16 | Discharge: 2024-08-16 | Disposition: A | Discharge status: Home / Self Care | Attending: Family Medicine | Admitting: Family Medicine

## 2024-08-16 DIAGNOSIS — R079 Chest pain, unspecified: Secondary | ICD-10-CM

## 2024-08-16 NOTE — Discharge Instructions (Signed)
Please go to the emergency room for further evaluation of your chest pain

## 2024-08-16 NOTE — ED Provider Notes (Signed)
 [facility]    CSN: [id number] Arrival date & time: 08/16/24  1559      History   Chief Complaint Chief Complaint  Patient presents with   Chest Pain   Shortness of Breath    HPI Casey Schmidt is a 68 y.o. female with a past medical history of COPD, hypertension, anxiety presents for chest pain.  Patient reports a week of a midsternal chest pain that radiates through towards her back.  It is intermittent and is associated with shortness of breath.  She denies any nausea/vomiting, palpitations, dizziness, syncope, fever.  She denies cough but has some congestion.  States she has had double pneumonia in the past but is not sure if that is what is going on.  Symptoms did begin after she was outside all day washing a car.  She does not wear oxygen for her COPD.   Chest Pain Associated symptoms: shortness of breath   Shortness of Breath Associated symptoms: chest pain     Past Medical History:  Diagnosis Date   Allergy    SEASONAL   Anal fissure    Anxiety    Arthritis    Asthma    Cataract    BILATERAL   COPD (chronic obstructive pulmonary disease) (HCC)    Emphysema of lung (HCC)    GERD (gastroesophageal reflux disease)    Hypertension    Migraines    h/o   Multiple gastric ulcers in the 70s   d/t BC powders   Pneumonia    Right calcaneal fracture 2017   ORIF in 12/2015   Tubular adenoma of colon 07/2015    Patient Active Problem List   Diagnosis Date Noted   Humerus fracture 05/15/2022   PCP NOTES >>>>>>>>> 08/06/2015   Annual physical exam 07/09/2015   Rectal bleeding 06/29/2015   Underweight 10/30/2014   Snoring-- insomnia 07/22/2014   DJD -- wrist pain-- pain managment 06/24/2014   SMOKER 05/04/2010   COPD (chronic obstructive pulmonary disease) (HCC) 05/04/2010   Anxiety 02/22/2010   CARPAL TUNNEL SYNDROME, BILATERAL 02/22/2010   Allergic rhinitis 02/22/2010    Past Surgical History:  Procedure Laterality Date   ganglion cyst  removal Left remotely    from wrist   ORIF CALCANEAL FRACTURE Right 12/30/2015   Dr. Josefina   RIGHT OOPHORECTOMY     SHOULDER SURGERY Left    TUBAL LIGATION     WRIST SURGERY Right 11/20/2013   fx    OB History   No obstetric history on file.      Home Medications    Prior to Admission medications   Medication Sig Start Date End Date Taking? Authorizing Provider  albuterol  (PROAIR  HFA) 108 (90 Base) MCG/ACT inhaler Inhale 2 puffs into the lungs every 4 (four) hours as needed for wheezing or shortness of breath. 07/25/16   Paz, Jose E, MD  ALPRAZolam  (XANAX ) 0.5 MG tablet Take 1 tablet (0.5 mg total) by mouth 4 (four) times daily as needed for anxiety. 10/31/16   Amon Aloysius BRAVO, MD  ALPRAZolam  (XANAX ) 0.5 MG tablet  05/02/17   [provider]  amphetamine-dextroamphetamine (ADDERALL XR) 20 MG 24 hr capsule Take 20 mg by mouth 2 (two) times daily.    [provider]  cetirizine (ZYRTEC) 5 MG chewable tablet Chew 5 mg by mouth daily.    [provider]  Cholecalciferol (VITAMIN D  PO) Take 1 tablet by mouth daily.    [provider]  cyclobenzaprine  (FLEXERIL ) 10 MG  tablet Take 1 tablet (10 mg total) by mouth 2 (two) times daily as needed for muscle spasms. Patient not taking: Reported on 03/30/2023 09/14/17   Vicky Charleston, PA-C  Fluticasone -Salmeterol (ADVAIR) 250-50 MCG/DOSE AEPB Inhale 1 puff into the lungs 2 (two) times daily. 08/16/16   Saguier, [place], PA-C  methadone (DOLOPHINE) 10 MG/5ML solution Take 55 mg by mouth daily.    [provider]  metoprolol succinate (TOPROL-XL) 50 MG 24 hr tablet Take 100 mg by mouth daily. 04/14/23   [provider]  Multiple Vitamin (MULTIVITAMIN) tablet Take 1 tablet by mouth daily.    [provider]  omeprazole  (PRILOSEC) 40 MG capsule Take 1 capsule (40 mg total) by mouth 2 (two) times daily. 03/30/23   Beather Delon Gibson, PA  OVER THE COUNTER MEDICATION Vitamin D  3. Once daily.     [provider]  ranitidine  (ZANTAC ) 300 MG tablet Take 1 tablet (300 mg total) by mouth at bedtime. Patient not taking: Reported on 05/15/2023 07/04/16   Paz, Jose E, MD  SYMBICORT  160-4.5 MCG/ACT inhaler Inhale 2 puffs into the lungs 2 (two) times daily. 07/27/17   [provider]    Family History Family History  Problem Relation Age of Onset   Hypertension Mother    Emphysema Mother    Hypertension Father    Skin cancer Father    Stroke Father    Colon polyps Paternal Aunt    Colon cancer Maternal Grandmother    Heart disease Paternal Grandfather    Breast cancer Neg Hx    CAD Neg Hx    Esophageal cancer Neg Hx    Stomach cancer Neg Hx    Rectal cancer Neg Hx     Social History Social History   Tobacco Use   Smoking status: Former    Current packs/day: 0.25    Average packs/day: 0.3 packs/day for 32.0 years (8.0 ttl pk-yrs)    Types: Cigarettes   Smokeless tobacco: Current   Tobacco comments:    smoking again as of 09-2014 Patient states she Vapes.   Vaping Use   Vaping status: Every Day  Substance Use Topics   Alcohol use: Yes    Comment: socially   Drug use: No     Allergies   Amoxicillin , Aspirin, Clarithromycin, Codeine, Erythromycin, Nsaids, Tolmetin, and Tramadol   Review of Systems Review of Systems  Respiratory:  Positive for shortness of breath.   Cardiovascular:  Positive for chest pain.     Physical Exam Triage Vital Signs ED Triage Vitals  Encounter Vitals Group     BP      Girls Systolic BP Percentile      Girls Diastolic BP Percentile      Boys Systolic BP Percentile      Boys Diastolic BP Percentile      Pulse      Resp      Temp      Temp src      SpO2      Weight      Height      Head Circumference      Peak Flow      Pain Score      Pain Loc      Pain Education      Exclude from Growth Chart    No data found.  Updated Vital Signs BP 126/81   Pulse 80   Temp 98.4 F (36.9 C) (Oral)   Resp 16    SpO2  94%   Visual Acuity Right Eye Distance:   Left Eye Distance:   Bilateral Distance:    Right Eye Near:   Left Eye Near:    Bilateral Near:     Physical Exam Vitals and nursing note reviewed.  Constitutional:      General: She is not in acute distress.    Appearance: Normal appearance. She is not ill-appearing.  HENT:     Head: Normocephalic and atraumatic.  Eyes:     Pupils: Pupils are equal, round, and reactive to light.  Cardiovascular:     Rate and Rhythm: Normal rate and regular rhythm.     Pulses: Normal pulses.  Pulmonary:     Effort: Pulmonary effort is normal.     Breath sounds: Normal breath sounds. No wheezing or rhonchi.  Skin:    General: Skin is warm and dry.  Neurological:     General: No focal deficit present.     Mental Status: She is alert and oriented to person, place, and time.  Psychiatric:        Mood and Affect: Mood normal.        Behavior: Behavior normal.      UC Treatments / Results  Labs (all labs ordered are listed, but only abnormal results are displayed) Labs Reviewed - No data to display  EKG   Radiology No results found.  Procedures ED EKG  Date/Time: 08/16/2024 4:19 PM  Performed by: Loreda Myla SAUNDERS, NP Authorized by: Loreda Myla SAUNDERS, NP   ECG interpreted by ED Physician in the absence of a cardiologist: no   Rate:    ECG rate:  81   ECG rate assessment: normal   Rhythm:    Rhythm: sinus rhythm   QRS:    Details:  LVH ST segments:    ST segments:  Normal T waves:    T waves: non-specific    (including critical care time)  Medications Ordered in UC Medications - No data to display  Initial Impression / Assessment and Plan / UC Course  I have reviewed the triage vital signs and the nursing notes.  Pertinent labs & imaging results that were available during my care of the patient were reviewed by me and considered in my medical decision making (see chart for details).     I reviewed exam and symptoms with  patient.  I discussed limitations and abilities of urgent care.  Given her history and her chest pain it does radiate through towards her back without a cough or fever I advised that she go to the emergency room to rule out any potential underlying cardiac cause.  She is in agreement with plan and will go POV to the emergency room. Final Clinical Impressions(s) / UC Diagnoses   Final diagnoses:  Chest pain, unspecified type     Discharge Instructions      Please go to the emergency room for further evaluation of your chest pain    ED Prescriptions   None    PDMP not reviewed this encounter.   Loreda Myla SAUNDERS, NP 08/16/24 [phone]

## 2024-08-16 NOTE — ED Triage Notes (Signed)
 C/O constant chest pains since washing her car in the heat 2 days ago. Pt states it feels like I have pneumonia. Has hx COPD; states does not wear O2.

## 2024-08-16 NOTE — ED Notes (Signed)
 Patient is being discharged from the Urgent Care and sent to the Emergency Department via private vehicle . Per DOROTHA Bold, NP, patient is in need of higher level of care due to chest pains, dyspnea. Patient is aware and verbalizes understanding of plan of care.  Vitals:   08/16/24 1603  BP: 126/81  Pulse: 80  Resp: 16  Temp: 98.4 F (36.9 C)  SpO2: 94%

## 2024-12-12 ENCOUNTER — Other Ambulatory Visit: Payer: Self-pay

## 2024-12-12 ENCOUNTER — Inpatient Hospital Stay (HOSPITAL_COMMUNITY)
Admission: EM | Admit: 2024-12-12 | Discharge: 2024-12-22 | DRG: 522 | Disposition: A | Discharge status: Skilled Nursing Facility | Attending: Internal Medicine | Admitting: Internal Medicine

## 2024-12-12 ENCOUNTER — Emergency Department (HOSPITAL_COMMUNITY)

## 2024-12-12 DIAGNOSIS — S7221XA Displaced subtrochanteric fracture of right femur, initial encounter for closed fracture: Secondary | ICD-10-CM | POA: Diagnosis present

## 2024-12-12 DIAGNOSIS — W010XXA Fall on same level from slipping, tripping and stumbling without subsequent striking against object, initial encounter: Secondary | ICD-10-CM | POA: Diagnosis present

## 2024-12-12 DIAGNOSIS — I10 Essential (primary) hypertension: Secondary | ICD-10-CM | POA: Diagnosis present

## 2024-12-12 DIAGNOSIS — Z808 Family history of malignant neoplasm of other organs or systems: Secondary | ICD-10-CM

## 2024-12-12 DIAGNOSIS — J410 Simple chronic bronchitis: Secondary | ICD-10-CM

## 2024-12-12 DIAGNOSIS — W19XXXA Unspecified fall, initial encounter: Principal | ICD-10-CM

## 2024-12-12 DIAGNOSIS — Z881 Allergy status to other antibiotic agents status: Secondary | ICD-10-CM

## 2024-12-12 DIAGNOSIS — G8929 Other chronic pain: Secondary | ICD-10-CM | POA: Diagnosis present

## 2024-12-12 DIAGNOSIS — E871 Hypo-osmolality and hyponatremia: Secondary | ICD-10-CM | POA: Diagnosis present

## 2024-12-12 DIAGNOSIS — S72009A Fracture of unspecified part of neck of unspecified femur, initial encounter for closed fracture: Secondary | ICD-10-CM

## 2024-12-12 DIAGNOSIS — S22019A Unspecified fracture of first thoracic vertebra, initial encounter for closed fracture: Secondary | ICD-10-CM | POA: Insufficient documentation

## 2024-12-12 DIAGNOSIS — J4489 Other specified chronic obstructive pulmonary disease: Secondary | ICD-10-CM | POA: Diagnosis present

## 2024-12-12 DIAGNOSIS — Z681 Body mass index (BMI) 19 or less, adult: Secondary | ICD-10-CM

## 2024-12-12 DIAGNOSIS — R0902 Hypoxemia: Secondary | ICD-10-CM | POA: Diagnosis present

## 2024-12-12 DIAGNOSIS — Z90721 Acquired absence of ovaries, unilateral: Secondary | ICD-10-CM

## 2024-12-12 DIAGNOSIS — Z7951 Long term (current) use of inhaled steroids: Secondary | ICD-10-CM

## 2024-12-12 DIAGNOSIS — Y92008 Other place in unspecified non-institutional (private) residence as the place of occurrence of the external cause: Secondary | ICD-10-CM

## 2024-12-12 DIAGNOSIS — R636 Underweight: Secondary | ICD-10-CM | POA: Diagnosis present

## 2024-12-12 DIAGNOSIS — Z800 Family history of malignant neoplasm of digestive organs: Secondary | ICD-10-CM

## 2024-12-12 DIAGNOSIS — J449 Chronic obstructive pulmonary disease, unspecified: Secondary | ICD-10-CM | POA: Diagnosis present

## 2024-12-12 DIAGNOSIS — S72001A Fracture of unspecified part of neck of right femur, initial encounter for closed fracture: Secondary | ICD-10-CM | POA: Insufficient documentation

## 2024-12-12 DIAGNOSIS — D649 Anemia, unspecified: Secondary | ICD-10-CM | POA: Insufficient documentation

## 2024-12-12 DIAGNOSIS — F419 Anxiety disorder, unspecified: Secondary | ICD-10-CM | POA: Diagnosis present

## 2024-12-12 DIAGNOSIS — M810 Age-related osteoporosis without current pathological fracture: Secondary | ICD-10-CM | POA: Diagnosis present

## 2024-12-12 DIAGNOSIS — S72011A Unspecified intracapsular fracture of right femur, initial encounter for closed fracture: Secondary | ICD-10-CM

## 2024-12-12 DIAGNOSIS — F17290 Nicotine dependence, other tobacco product, uncomplicated: Secondary | ICD-10-CM | POA: Diagnosis present

## 2024-12-12 DIAGNOSIS — J439 Emphysema, unspecified: Secondary | ICD-10-CM | POA: Diagnosis present

## 2024-12-12 DIAGNOSIS — Z885 Allergy status to narcotic agent status: Secondary | ICD-10-CM

## 2024-12-12 DIAGNOSIS — E876 Hypokalemia: Secondary | ICD-10-CM | POA: Diagnosis present

## 2024-12-12 DIAGNOSIS — Z79899 Other long term (current) drug therapy: Secondary | ICD-10-CM

## 2024-12-12 DIAGNOSIS — Z825 Family history of asthma and other chronic lower respiratory diseases: Secondary | ICD-10-CM

## 2024-12-12 DIAGNOSIS — Z8711 Personal history of peptic ulcer disease: Secondary | ICD-10-CM

## 2024-12-12 DIAGNOSIS — Z7983 Long term (current) use of bisphosphonates: Secondary | ICD-10-CM

## 2024-12-12 DIAGNOSIS — K219 Gastro-esophageal reflux disease without esophagitis: Secondary | ICD-10-CM | POA: Diagnosis present

## 2024-12-12 DIAGNOSIS — Z888 Allergy status to other drugs, medicaments and biological substances status: Secondary | ICD-10-CM

## 2024-12-12 DIAGNOSIS — Z880 Allergy status to penicillin: Secondary | ICD-10-CM

## 2024-12-12 DIAGNOSIS — S72121A Displaced fracture of lesser trochanter of right femur, initial encounter for closed fracture: Principal | ICD-10-CM | POA: Diagnosis present

## 2024-12-12 DIAGNOSIS — F909 Attention-deficit hyperactivity disorder, unspecified type: Secondary | ICD-10-CM | POA: Diagnosis present

## 2024-12-12 DIAGNOSIS — Z886 Allergy status to analgesic agent status: Secondary | ICD-10-CM

## 2024-12-12 DIAGNOSIS — Z823 Family history of stroke: Secondary | ICD-10-CM

## 2024-12-12 DIAGNOSIS — Z8249 Family history of ischemic heart disease and other diseases of the circulatory system: Secondary | ICD-10-CM

## 2024-12-12 DIAGNOSIS — D72829 Elevated white blood cell count, unspecified: Secondary | ICD-10-CM | POA: Insufficient documentation

## 2024-12-12 DIAGNOSIS — L03114 Cellulitis of left upper limb: Secondary | ICD-10-CM | POA: Diagnosis present

## 2024-12-12 LAB — CBC WITH DIFFERENTIAL/PLATELET
Abs Immature Granulocytes: 0.05 K/uL (ref 0.00–0.07)
Basophils Absolute: 0 K/uL (ref 0.0–0.1)
Basophils Relative: 0 %
Eosinophils Absolute: 0 K/uL (ref 0.0–0.5)
Eosinophils Relative: 0 %
HCT: 33.9 % — ABNORMAL LOW (ref 36.0–46.0)
Hemoglobin: 10.9 g/dL — ABNORMAL LOW (ref 12.0–15.0)
Immature Granulocytes: 0 %
Lymphocytes Relative: 5 %
Lymphs Abs: 0.7 K/uL (ref 0.7–4.0)
MCH: 27.9 pg (ref 26.0–34.0)
MCHC: 32.2 g/dL (ref 30.0–36.0)
MCV: 86.7 fL (ref 80.0–100.0)
Monocytes Absolute: 0.7 K/uL (ref 0.1–1.0)
Monocytes Relative: 5 %
Neutro Abs: 11.8 K/uL — ABNORMAL HIGH (ref 1.7–7.7)
Neutrophils Relative %: 90 %
Platelets: 387 K/uL (ref 150–400)
RBC: 3.91 MIL/uL (ref 3.87–5.11)
RDW: 16.7 % — ABNORMAL HIGH (ref 11.5–15.5)
WBC: 13.2 K/uL — ABNORMAL HIGH (ref 4.0–10.5)
nRBC: 0 % (ref 0.0–0.2)

## 2024-12-12 LAB — BLOOD GAS, VENOUS
Acid-Base Excess: 1.8 mmol/L (ref 0.0–2.0)
Bicarbonate: 27.2 mmol/L (ref 20.0–28.0)
O2 Saturation: 88.4 %
Patient temperature: 37
pCO2, Ven: 45 mmHg (ref 44–60)
pH, Ven: 7.39 (ref 7.25–7.43)
pO2, Ven: 57 mmHg — ABNORMAL HIGH (ref 32–45)

## 2024-12-12 LAB — BASIC METABOLIC PANEL WITH GFR
Anion gap: 11 (ref 5–15)
BUN: 30 mg/dL — ABNORMAL HIGH (ref 8–23)
CO2: 25 mmol/L (ref 22–32)
Calcium: 8.2 mg/dL — ABNORMAL LOW (ref 8.9–10.3)
Chloride: 98 mmol/L (ref 98–111)
Creatinine, Ser: 0.77 mg/dL (ref 0.44–1.00)
GFR, Estimated: >60 mL/min
Glucose, Bld: 105 mg/dL — ABNORMAL HIGH (ref 70–99)
Potassium: 3.1 mmol/L — ABNORMAL LOW (ref 3.5–5.1)
Sodium: 134 mmol/L — ABNORMAL LOW (ref 135–145)

## 2024-12-12 MED ORDER — ONDANSETRON HCL 4 MG/2ML IJ SOLN
4.0000 mg | Freq: Once | INTRAMUSCULAR | Status: AC
  Administered 2024-12-12: 4 mg via INTRAVENOUS
  Filled 2024-12-12: qty 2

## 2024-12-12 MED ORDER — MORPHINE SULFATE (PF) 4 MG/ML IV SOLN
4.0000 mg | Freq: Once | INTRAVENOUS | Status: AC
  Administered 2024-12-12: 4 mg via INTRAVENOUS
  Filled 2024-12-12: qty 1

## 2024-12-12 MED ORDER — LACTATED RINGERS IV BOLUS
1000.0000 mL | Freq: Once | INTRAVENOUS | Status: AC
  Administered 2024-12-12: 1000 mL via INTRAVENOUS

## 2024-12-12 NOTE — ED Provider Notes (Signed)
 " [place] [facility]MENT AT [facility] Provider Note   CSN: [id number] Arrival date & time: 12/12/24  2100     History Chief Complaint  Patient presents with   Fall    HPI: Casey Schmidt is a 69 y.o. female with history pertinent for COPD not on home oxygen, anxiety, degenerative disc disease who presents complaining of fall. Patient arrived via EMS from home.  History provided by patient and EMS.  No interpreter required during this encounter.  Per EMS, patient recently had a fall approximately 3 weeks ago.  Called EMS out at that time to help her up off the floor, however patient declined transport at that time.  Reportedly patient has had progressive pain and difficulty with ambulation since the first fall, has been utilizing a walker, however now pain is too severe for even use of the walker, and patient is unable to get to the bathroom medical time.  Reports that patient was hemodynamically stable and route, was initially mildly hypoxic, has a history of COPD not on home oxygen, was started on 2 L nasal cannula with improvement.  Patient did receive 200 mcg of fentanyl  for pain and route.  Patient reports that she had an initial fall approximately 3 weeks ago after her puppy got caught under my feet, and reports that she did have a second fall a couple days later due to pain in her right lower extremity.  Reports that pain has been progressive, it is primarily in her right lower extremity radiating from her right anterior thigh down to her ankle.  Denies any use of anticoagulants, denies any chest pain, shortness of breath, denies hitting her head, loss of consciousness during either of the falls.  Patient's recorded medical, surgical, social, medication list and allergies were reviewed in the Snapshot window as part of the initial history.   Prior to Admission medications  Medication Sig Start Date End Date Taking? Authorizing Provider  albuterol  (PROAIR  HFA)  108 (90 Base) MCG/ACT inhaler Inhale 2 puffs into the lungs every 4 (four) hours as needed for wheezing or shortness of breath. 07/25/16   Paz, Jose E, MD  ALPRAZolam  (XANAX ) 0.5 MG tablet Take 1 tablet (0.5 mg total) by mouth 4 (four) times daily as needed for anxiety. 10/31/16   Amon Aloysius BRAVO, MD  ALPRAZolam  (XANAX ) 0.5 MG tablet  05/02/17   [provider]  amphetamine -dextroamphetamine  (ADDERALL  XR) 20 MG 24 hr capsule Take 20 mg by mouth 2 (two) times daily.    [provider]  cetirizine (ZYRTEC) 5 MG chewable tablet Chew 5 mg by mouth daily.    [provider]  Cholecalciferol (VITAMIN D  PO) Take 1 tablet by mouth daily.    [provider]  cyclobenzaprine  (FLEXERIL ) 10 MG tablet Take 1 tablet (10 mg total) by mouth 2 (two) times daily as needed for muscle spasms. Patient not taking: Reported on 03/30/2023 09/14/17   Vicky Charleston, PA-C  Fluticasone -Salmeterol (ADVAIR) 250-50 MCG/DOSE AEPB Inhale 1 puff into the lungs 2 (two) times daily. 08/16/16   Saguier, [place], PA-C  methadone (DOLOPHINE) 10 MG/5ML solution Take 55 mg by mouth daily.    [provider]  metoprolol  succinate (TOPROL -XL) 50 MG 24 hr tablet Take 100 mg by mouth daily. 04/14/23   [provider]  Multiple Vitamin (MULTIVITAMIN) tablet Take 1 tablet by mouth daily.    [provider]  omeprazole  (PRILOSEC) 40 MG capsule Take 1 capsule (40 mg total) by mouth 2 (  two) times daily. 03/30/23   Beather Delon Gibson, PA  OVER THE COUNTER MEDICATION Vitamin D  3. Once daily.    [provider]  ranitidine  (ZANTAC ) 300 MG tablet Take 1 tablet (300 mg total) by mouth at bedtime. Patient not taking: Reported on 05/15/2023 07/04/16   Paz, Jose E, MD  SYMBICORT  160-4.5 MCG/ACT inhaler Inhale 2 puffs into the lungs 2 (two) times daily. 07/27/17   [provider]     Allergies: Amoxicillin , Aspirin, Clarithromycin, Codeine, Erythromycin, Nsaids, Tolmetin, and Tramadol    Review of Systems   ROS as per HPI  Physical Exam Updated Vital Signs BP (!) 151/87 (BP Location: Right Arm)   Pulse 85   Temp 98.6 F (37 C) (Oral)   Resp 16   SpO2 90%  Physical Exam Vitals and nursing note reviewed.  Constitutional:      General: She is not in acute distress.    Appearance: She is well-developed.  HENT:     Head: Normocephalic and atraumatic.  Eyes:     Conjunctiva/sclera: Conjunctivae normal.  Cardiovascular:     Rate and Rhythm: Normal rate and regular rhythm.     Heart sounds: No murmur heard. Pulmonary:     Effort: Pulmonary effort is normal. No respiratory distress.     Breath sounds: Normal breath sounds.  Abdominal:     Palpations: Abdomen is soft.     Tenderness: There is no abdominal tenderness.  Musculoskeletal:        General: No swelling.     Cervical back: Neck supple.     Comments: Diffuse tenderness to palpation of lower extremity with bony point tenderness, gross deformity.  Skin:    General: Skin is warm and dry.     Capillary Refill: Capillary refill takes less than 2 seconds.  Neurological:     Mental Status: She is alert.  Psychiatric:        Mood and Affect: Mood normal.     ED Course/ Medical Decision Making/ A&P    Procedures Procedures   Medications Ordered in ED Medications  lactated ringers  bolus 1,000 mL (1,000 mLs Intravenous New Bag/Given 12/12/24 2224)  morphine  (PF) 4 MG/ML injection 4 mg (4 mg Intravenous Given 12/12/24 2220)  ondansetron  (ZOFRAN ) injection 4 mg (4 mg Intravenous Given 12/12/24 2220)    Medical Decision Making:   Casey Schmidt is a 69 y.o. female who presents for recent fall with persistent right lower extremity pain as per above.  Physical exam is pertinent for tenderness to right lower extremity without gross deformity.   The differential includes but is not limited to , ICH, TBI, skull fracture, spinal fracture/dislocation, blunt thoracic trauma, hemothorax, pneumothorax, rib  fractures, blunt abdominal trauma, hemorrhage, extremity fracture, dislocation.  Independent historian: EMS  External data reviewed: Labs: reviewed prior labs for baseline  Initial Plan:  VBG to evaluate for hypercarbic respiratory failure Screening labs including CBC and Metabolic panel to evaluate for infectious or metabolic etiology of disease.  Screening CT head and CT C-spine given patient is greater than 65 with fall.  Screening CT chest abdomen pelvis given fall with diffuse pain. Objective evaluation as below reviewed   Labs: Ordered and Independent interpretation CBC: CT with chronic leukocytosis stable in comparison to prior.  Slight anemia, which has progressed from prior however there has not been a recent hemoglobin for the past 8 years.  No thrombocytopenia BMP: No AKI emergent electrolyte derangement VBG without acidosis or hypercarbia  Radiology: Ordered, Independent  interpretation, Details: Reviewed x-rays of the foot, ankle, tib-fib, femur, I do not appreciate displaced fracture, dislocation., and All images reviewed independently.  Agree with radiology report at this time.  CT is not yet obtained at the time of handoff.  DG Foot 2 Views Right Result Date: 12/12/2024 EXAM: 1 OR 2 VIEW(S) XRAY OF THE FOOT 12/12/2024 10:10:00 PM COMPARISON: 12/07/2015 CLINICAL HISTORY: Fall, pain. FINDINGS: BONES AND JOINTS: Screw fixation of the calcaneus in place. Degenerative changes of the first MTP joint. Diffuse bone demineralization. No acute fracture. No malalignment. SOFT TISSUES: Soft tissue swelling of the forefoot. IMPRESSION: 1. No acute fracture or dislocation. 2. Soft tissue swelling of the forefoot. 3. Screw fixation of the calcaneus is present. 4. Diffuse bone demineralization. Electronically signed by: Elsie Gravely MD 12/12/2024 10:20 PM EST RP Workstation: [id number]   DG Ankle Complete Right Result Date: 12/12/2024 EXAM: 3 OR MORE VIEW(S) XRAY OF THE RIGHT ANKLE  12/12/2024 10:10:00 PM CLINICAL HISTORY: Fall, pain. COMPARISON: Right foot 12/07/2015. FINDINGS: BONES AND JOINTS: Calcaneal screw in place. First MTP degenerative changes. Degenerative changes in the intertarsal and subtalar joints. Diffuse bone demineralization. No acute fracture. No malalignment. SOFT TISSUES: Soft tissue swelling at the lateral malleolus. IMPRESSION: 1. No acute fracture or dislocation. 2. Soft tissue swelling at the lateral malleolus. 3. Calcaneal screw in place. Electronically signed by: Elsie Gravely MD 12/12/2024 10:19 PM EST RP Workstation: [id number]   DG Tibia/Fibula Right Result Date: 12/12/2024 EXAM: 2 VIEW(S) XRAY OF THE RIGHT TIBIA AND FIBULA 12/12/2024 10:10:00 PM COMPARISON: Right knee 09/14/2017. CLINICAL HISTORY: Fall, pain. FINDINGS: BONES AND JOINTS: Surgical screw in talus noted. No acute fracture. No malalignment. SOFT TISSUES: Unremarkable. IMPRESSION: 1. No acute fracture or dislocation. Electronically signed by: Elsie Gravely MD 12/12/2024 10:17 PM EST RP Workstation: [id number]   DG Femur Min 2 Views Right Result Date: 12/12/2024 EXAM: 2 VIEW(S) XRAY OF THE RIGHT FEMUR 12/12/2024 10:10:00 PM COMPARISON: Right knee 09/14/2017. CLINICAL HISTORY: Fall, pain. FINDINGS: BONES AND JOINTS: Possible acute impacted fracture of the right femoral head and neck with varus angulation. Degenerative changes of both hips. SOFT TISSUES: Unremarkable. LIMITATIONS/ARTIFACTS: Visualization is limited due to crosstable technique. RECOMMENDATIONS: Dedicated views of the right hip are recommended for better visualization. IMPRESSION: 1. Possible acute impacted fracture of the right femoral head and neck with varus angulation. Visualization is limited due to crosstable technique. Dedicated views of the right hip are recommended for better visualization. Electronically signed by: Elsie Gravely MD 12/12/2024 10:17 PM EST RP Workstation: [id number]    EKG/Medicine tests: Not  indicated EKG Interpretation:                   Interventions: Morphine , Zofran   See the EMR for full details regarding lab and imaging results.  Currently, patient is awake, alert, and protecting own airway and is hemodynamically stable.  Patient does have a history of COPD, does not have shortness of breath currently, no hypercarbia or acidosis on VBG.  Wheezing on exam.  Given recent falls, and age, do feel that patient requires trauma pan scans as well as x-rays of the right lower extremity given focal pain in this area.  Screening labs obtained.  There is a possible proximal femur fracture on the plain film, will further evaluate with a CT of the chest abdomen pelvis as well as dedicated CT of the right hip.  Plan at the time of handoff, follow-up CT imaging, dissipate that patient require admission for pain management and likely fracture.  Presentation is most consistent with acute complicated illness and Current presentation is complicated by underlying chronic conditions  Discussion of management or test interpretations with external provider(s): None by the time of handoff  Risk Drugs:Parenteral controlled substances  Disposition: HANDOFF: At the time of signout, the patients CT imaging had not yet been completed. I transferred care of the patient at the time of signout to Dr. Palumbo. I informed the incoming care provider of the patient's history, status, and management plan. I addressed all of their concerns and/or questions to the best of my ability. Please refer to the incoming care provider's note for details regarding the remainder of the patient's ED course and disposition.  MDM generated using voice dictation software and may contain dictation errors.  Please contact me for any clarification or with any questions.   Clinical Impression:  1. Fall, initial encounter   2. Right hip pain      Data Unavailable   Final Clinical Impression(s) / ED Diagnoses Final diagnoses:   Fall, initial encounter  Right hip pain    Rx / DC Orders ED Discharge Orders     None        Rogelia Jerilynn RAMAN, MD 12/13/24 0011  "

## 2024-12-12 NOTE — ED Triage Notes (Signed)
 Pt came in via EMS from home w/ c/o of a fall that happened a couple of weeks ago. C/o of pain in tailbone and Left hip pain. Decreased mobility since fall. Denies hitting head. Hx of COPD.  200 mcg Fentanyl

## 2024-12-13 ENCOUNTER — Inpatient Hospital Stay (HOSPITAL_COMMUNITY)

## 2024-12-13 ENCOUNTER — Emergency Department (HOSPITAL_COMMUNITY)

## 2024-12-13 ENCOUNTER — Inpatient Hospital Stay (HOSPITAL_COMMUNITY): Admitting: Anesthesiology

## 2024-12-13 ENCOUNTER — Encounter (HOSPITAL_COMMUNITY)
Admission: EM | Disposition: A | Discharge status: Skilled Nursing Facility | Payer: Self-pay | Source: Home / Self Care | Attending: Internal Medicine

## 2024-12-13 ENCOUNTER — Encounter (HOSPITAL_COMMUNITY): Payer: Self-pay | Admitting: Internal Medicine

## 2024-12-13 DIAGNOSIS — Z886 Allergy status to analgesic agent status: Secondary | ICD-10-CM | POA: Diagnosis not present

## 2024-12-13 DIAGNOSIS — F909 Attention-deficit hyperactivity disorder, unspecified type: Secondary | ICD-10-CM | POA: Insufficient documentation

## 2024-12-13 DIAGNOSIS — I10 Essential (primary) hypertension: Secondary | ICD-10-CM | POA: Insufficient documentation

## 2024-12-13 DIAGNOSIS — Z825 Family history of asthma and other chronic lower respiratory diseases: Secondary | ICD-10-CM | POA: Diagnosis not present

## 2024-12-13 DIAGNOSIS — Z79899 Other long term (current) drug therapy: Secondary | ICD-10-CM | POA: Diagnosis not present

## 2024-12-13 DIAGNOSIS — Z87891 Personal history of nicotine dependence: Secondary | ICD-10-CM | POA: Diagnosis not present

## 2024-12-13 DIAGNOSIS — Y92008 Other place in unspecified non-institutional (private) residence as the place of occurrence of the external cause: Secondary | ICD-10-CM | POA: Diagnosis not present

## 2024-12-13 DIAGNOSIS — D649 Anemia, unspecified: Secondary | ICD-10-CM | POA: Insufficient documentation

## 2024-12-13 DIAGNOSIS — J4489 Other specified chronic obstructive pulmonary disease: Secondary | ICD-10-CM | POA: Diagnosis present

## 2024-12-13 DIAGNOSIS — D72829 Elevated white blood cell count, unspecified: Secondary | ICD-10-CM | POA: Insufficient documentation

## 2024-12-13 DIAGNOSIS — S72121A Displaced fracture of lesser trochanter of right femur, initial encounter for closed fracture: Secondary | ICD-10-CM | POA: Diagnosis present

## 2024-12-13 DIAGNOSIS — E871 Hypo-osmolality and hyponatremia: Secondary | ICD-10-CM | POA: Diagnosis present

## 2024-12-13 DIAGNOSIS — S72011A Unspecified intracapsular fracture of right femur, initial encounter for closed fracture: Secondary | ICD-10-CM | POA: Diagnosis not present

## 2024-12-13 DIAGNOSIS — J439 Emphysema, unspecified: Secondary | ICD-10-CM | POA: Diagnosis present

## 2024-12-13 DIAGNOSIS — Z8249 Family history of ischemic heart disease and other diseases of the circulatory system: Secondary | ICD-10-CM | POA: Diagnosis not present

## 2024-12-13 DIAGNOSIS — Z823 Family history of stroke: Secondary | ICD-10-CM | POA: Diagnosis not present

## 2024-12-13 DIAGNOSIS — S72009A Fracture of unspecified part of neck of unspecified femur, initial encounter for closed fracture: Secondary | ICD-10-CM

## 2024-12-13 DIAGNOSIS — J410 Simple chronic bronchitis: Secondary | ICD-10-CM | POA: Diagnosis present

## 2024-12-13 DIAGNOSIS — L03114 Cellulitis of left upper limb: Secondary | ICD-10-CM | POA: Diagnosis present

## 2024-12-13 DIAGNOSIS — Z885 Allergy status to narcotic agent status: Secondary | ICD-10-CM | POA: Diagnosis not present

## 2024-12-13 DIAGNOSIS — Z800 Family history of malignant neoplasm of digestive organs: Secondary | ICD-10-CM | POA: Diagnosis not present

## 2024-12-13 DIAGNOSIS — F419 Anxiety disorder, unspecified: Secondary | ICD-10-CM

## 2024-12-13 DIAGNOSIS — E876 Hypokalemia: Secondary | ICD-10-CM | POA: Diagnosis present

## 2024-12-13 DIAGNOSIS — Z7983 Long term (current) use of bisphosphonates: Secondary | ICD-10-CM | POA: Diagnosis not present

## 2024-12-13 DIAGNOSIS — S72001A Fracture of unspecified part of neck of right femur, initial encounter for closed fracture: Secondary | ICD-10-CM | POA: Insufficient documentation

## 2024-12-13 DIAGNOSIS — Z7951 Long term (current) use of inhaled steroids: Secondary | ICD-10-CM | POA: Diagnosis not present

## 2024-12-13 DIAGNOSIS — Z880 Allergy status to penicillin: Secondary | ICD-10-CM | POA: Diagnosis not present

## 2024-12-13 DIAGNOSIS — Z888 Allergy status to other drugs, medicaments and biological substances status: Secondary | ICD-10-CM | POA: Diagnosis not present

## 2024-12-13 DIAGNOSIS — S22019A Unspecified fracture of first thoracic vertebra, initial encounter for closed fracture: Secondary | ICD-10-CM | POA: Insufficient documentation

## 2024-12-13 DIAGNOSIS — Z881 Allergy status to other antibiotic agents status: Secondary | ICD-10-CM | POA: Diagnosis not present

## 2024-12-13 DIAGNOSIS — S7221XA Displaced subtrochanteric fracture of right femur, initial encounter for closed fracture: Secondary | ICD-10-CM | POA: Diagnosis present

## 2024-12-13 DIAGNOSIS — W010XXA Fall on same level from slipping, tripping and stumbling without subsequent striking against object, initial encounter: Secondary | ICD-10-CM | POA: Diagnosis present

## 2024-12-13 DIAGNOSIS — Z681 Body mass index (BMI) 19 or less, adult: Secondary | ICD-10-CM | POA: Diagnosis not present

## 2024-12-13 DIAGNOSIS — K219 Gastro-esophageal reflux disease without esophagitis: Secondary | ICD-10-CM | POA: Diagnosis present

## 2024-12-13 LAB — CBC
HCT: 36.3 % (ref 36.0–46.0)
HCT: 31.2 % — ABNORMAL LOW (ref 36.0–46.0)
Hemoglobin: 11.1 g/dL — ABNORMAL LOW (ref 12.0–15.0)
Hemoglobin: 10.1 g/dL — ABNORMAL LOW (ref 12.0–15.0)
MCH: 27.2 pg (ref 26.0–34.0)
MCH: 28.6 pg (ref 26.0–34.0)
MCHC: 30.6 g/dL (ref 30.0–36.0)
MCHC: 32.4 g/dL (ref 30.0–36.0)
MCV: 89 fL (ref 80.0–100.0)
MCV: 88.4 fL (ref 80.0–100.0)
Platelets: 402 10*3/uL — ABNORMAL HIGH (ref 150–400)
Platelets: 504 10*3/uL — ABNORMAL HIGH (ref 150–400)
RBC: 4.08 MIL/uL (ref 3.87–5.11)
RBC: 3.53 MIL/uL — ABNORMAL LOW (ref 3.87–5.11)
RDW: 16.6 % — ABNORMAL HIGH (ref 11.5–15.5)
RDW: 16.5 % — ABNORMAL HIGH (ref 11.5–15.5)
WBC: 14.3 10*3/uL — ABNORMAL HIGH (ref 4.0–10.5)
WBC: 20.7 10*3/uL — ABNORMAL HIGH (ref 4.0–10.5)
nRBC: 0 % (ref 0.0–0.2)
nRBC: 0 % (ref 0.0–0.2)

## 2024-12-13 LAB — BASIC METABOLIC PANEL WITH GFR
Anion gap: 11 (ref 5–15)
BUN: 23 mg/dL (ref 8–23)
CO2: 27 mmol/L (ref 22–32)
Calcium: 8.3 mg/dL — ABNORMAL LOW (ref 8.9–10.3)
Chloride: 98 mmol/L (ref 98–111)
Creatinine, Ser: 0.69 mg/dL (ref 0.44–1.00)
GFR, Estimated: >60 mL/min
Glucose, Bld: 99 mg/dL (ref 70–99)
Potassium: 3.1 mmol/L — ABNORMAL LOW (ref 3.5–5.1)
Sodium: 136 mmol/L (ref 135–145)

## 2024-12-13 LAB — TYPE AND SCREEN
ABO/RH(D): O POS
Antibody Screen: NEGATIVE

## 2024-12-13 LAB — CREATININE, SERUM
Creatinine, Ser: 0.76 mg/dL (ref 0.44–1.00)
GFR, Estimated: >60 mL/min

## 2024-12-13 MED ORDER — SUGAMMADEX SODIUM 200 MG/2ML IV SOLN
INTRAVENOUS | Status: DC | PRN
  Administered 2024-12-13: 150 mg via INTRAVENOUS

## 2024-12-13 MED ORDER — LIDOCAINE HCL (PF) 2 % IJ SOLN
INTRAMUSCULAR | Status: AC
  Filled 2024-12-13: qty 5

## 2024-12-13 MED ORDER — ROCURONIUM BROMIDE 10 MG/ML (PF) SYRINGE
PREFILLED_SYRINGE | INTRAVENOUS | Status: DC | PRN
  Administered 2024-12-13: 10 mg via INTRAVENOUS
  Administered 2024-12-13: 35 mg via INTRAVENOUS
  Administered 2024-12-13: 10 mg via INTRAVENOUS

## 2024-12-13 MED ORDER — IOHEXOL 300 MG/ML  SOLN
100.0000 mL | Freq: Once | INTRAMUSCULAR | Status: AC | PRN
  Administered 2024-12-13: 75 mL via INTRAVENOUS

## 2024-12-13 MED ORDER — MORPHINE SULFATE (PF) 2 MG/ML IV SOLN: 0.5000 mg | INTRAVENOUS | Status: DC | PRN

## 2024-12-13 MED ORDER — VANCOMYCIN HCL 1000 MG IV SOLR
INTRAVENOUS | Status: AC
  Filled 2024-12-13: qty 20

## 2024-12-13 MED ORDER — PHENYLEPHRINE HCL-NACL 20-0.9 MG/250ML-% IV SOLN
INTRAVENOUS | Status: DC | PRN
  Administered 2024-12-13: 25 ug/min via INTRAVENOUS

## 2024-12-13 MED ORDER — ONDANSETRON HCL 4 MG/2ML IJ SOLN: 4.0000 mg | Freq: Once | INTRAMUSCULAR | Status: DC | PRN

## 2024-12-13 MED ORDER — ADULT MULTIVITAMIN W/MINERALS CH
1.0000 | ORAL_TABLET | Freq: Every day | ORAL | Status: DC
  Administered 2024-12-13 – 2024-12-16 (×3): 1 via ORAL
  Filled 2024-12-13 (×8): qty 1

## 2024-12-13 MED ORDER — MIDAZOLAM HCL 5 MG/5ML IJ SOLN
INTRAMUSCULAR | Status: DC | PRN
  Administered 2024-12-13: 1 mg via INTRAVENOUS

## 2024-12-13 MED ORDER — SUCCINYLCHOLINE CHLORIDE 200 MG/10ML IV SOSY
PREFILLED_SYRINGE | INTRAVENOUS | Status: DC | PRN
  Administered 2024-12-13: 40 mg via INTRAVENOUS

## 2024-12-13 MED ORDER — ACETAMINOPHEN 500 MG PO TABS
500.0000 mg | ORAL_TABLET | Freq: Four times a day (QID) | ORAL | Status: AC
  Administered 2024-12-13: 500 mg via ORAL
  Filled 2024-12-13 (×2): qty 1

## 2024-12-13 MED ORDER — POVIDONE-IODINE 10 % EX SWAB: 2.0000 | Freq: Once | CUTANEOUS | Status: DC

## 2024-12-13 MED ORDER — POTASSIUM CHLORIDE 10 MEQ/100ML IV SOLN
10.0000 meq | INTRAVENOUS | Status: AC
  Administered 2024-12-13 (×3): 10 meq via INTRAVENOUS
  Filled 2024-12-13 (×3): qty 100

## 2024-12-13 MED ORDER — ENOXAPARIN SODIUM 30 MG/0.3ML IJ SOSY: 30.0000 mg | PREFILLED_SYRINGE | INTRAMUSCULAR | 0 refills | Status: AC

## 2024-12-13 MED ORDER — ONDANSETRON HCL 4 MG/2ML IJ SOLN
INTRAMUSCULAR | Status: AC
  Filled 2024-12-13: qty 2

## 2024-12-13 MED ORDER — MENTHOL 3 MG MT LOZG: 1.0000 | LOZENGE | OROMUCOSAL | Status: DC | PRN

## 2024-12-13 MED ORDER — ALBUMIN HUMAN 5 % IV SOLN
INTRAVENOUS | Status: AC
  Filled 2024-12-13: qty 250

## 2024-12-13 MED ORDER — ACETAMINOPHEN 500 MG PO TABS: 1000.0000 mg | ORAL_TABLET | Freq: Once | ORAL | Status: DC

## 2024-12-13 MED ORDER — PHENYLEPHRINE 80 MCG/ML (10ML) SYRINGE FOR IV PUSH (FOR BLOOD PRESSURE SUPPORT)
PREFILLED_SYRINGE | INTRAVENOUS | Status: AC
  Filled 2024-12-13: qty 10

## 2024-12-13 MED ORDER — MORPHINE SULFATE (PF) 2 MG/ML IV SOLN
0.5000 mg | INTRAVENOUS | Status: DC | PRN
  Administered 2024-12-13 – 2024-12-19 (×13): 1 mg via INTRAVENOUS
  Filled 2024-12-13 (×14): qty 1

## 2024-12-13 MED ORDER — PROPOFOL 1000 MG/100ML IV EMUL
INTRAVENOUS | Status: AC
  Filled 2024-12-13: qty 100

## 2024-12-13 MED ORDER — ONDANSETRON HCL 4 MG PO TABS: 4.0000 mg | ORAL_TABLET | Freq: Four times a day (QID) | ORAL | Status: DC | PRN

## 2024-12-13 MED ORDER — SUGAMMADEX SODIUM 200 MG/2ML IV SOLN
INTRAVENOUS | Status: AC
  Filled 2024-12-13: qty 2

## 2024-12-13 MED ORDER — ALUM & MAG HYDROXIDE-SIMETH 200-200-20 MG/5ML PO SUSP
30.0000 mL | ORAL | Status: DC | PRN
  Administered 2024-12-18: 30 mL via ORAL
  Filled 2024-12-13: qty 30

## 2024-12-13 MED ORDER — DOXYCYCLINE HYCLATE 100 MG PO TABS
100.0000 mg | ORAL_TABLET | Freq: Two times a day (BID) | ORAL | Status: DC
  Filled 2024-12-13: qty 1

## 2024-12-13 MED ORDER — HYDROCODONE-ACETAMINOPHEN 7.5-325 MG PO TABS
1.0000 | ORAL_TABLET | Freq: Every day | ORAL | Status: DC | PRN
  Administered 2024-12-14: 1 via ORAL
  Filled 2024-12-13 (×3): qty 1

## 2024-12-13 MED ORDER — FENTANYL CITRATE (PF) 50 MCG/ML IJ SOSY: 25.0000 ug | PREFILLED_SYRINGE | INTRAMUSCULAR | Status: DC | PRN

## 2024-12-13 MED ORDER — CEFAZOLIN SODIUM-DEXTROSE 2-4 GM/100ML-% IV SOLN
2.0000 g | INTRAVENOUS | Status: AC
  Administered 2024-12-13: 2 g via INTRAVENOUS

## 2024-12-13 MED ORDER — ALBUMIN HUMAN 5 % IV SOLN: INTRAVENOUS | Status: DC | PRN

## 2024-12-13 MED ORDER — ACETAMINOPHEN 10 MG/ML IV SOLN
INTRAVENOUS | Status: AC
  Filled 2024-12-13: qty 100

## 2024-12-13 MED ORDER — ISOPROPYL ALCOHOL 70 % SOLN
Status: AC
  Filled 2024-12-13: qty 480

## 2024-12-13 MED ORDER — MIDAZOLAM HCL 2 MG/2ML IJ SOLN
INTRAMUSCULAR | Status: AC
  Filled 2024-12-13: qty 2

## 2024-12-13 MED ORDER — POTASSIUM CHLORIDE 20 MEQ PO PACK
40.0000 meq | PACK | Freq: Once | ORAL | Status: AC
  Administered 2024-12-13: 40 meq via ORAL
  Filled 2024-12-13: qty 2

## 2024-12-13 MED ORDER — PROPOFOL 10 MG/ML IV BOLUS
INTRAVENOUS | Status: DC | PRN
  Administered 2024-12-13: 100 mg via INTRAVENOUS

## 2024-12-13 MED ORDER — PHENOL 1.4 % MT LIQD: 1.0000 | OROMUCOSAL | Status: DC | PRN

## 2024-12-13 MED ORDER — TRANEXAMIC ACID-NACL 1000-0.7 MG/100ML-% IV SOLN
INTRAVENOUS | Status: AC
  Filled 2024-12-13: qty 100

## 2024-12-13 MED ORDER — SODIUM CHLORIDE 0.9 % IR SOLN
Status: DC | PRN
  Administered 2024-12-13: 1000 mL

## 2024-12-13 MED ORDER — TRANEXAMIC ACID-NACL 1000-0.7 MG/100ML-% IV SOLN
1000.0000 mg | INTRAVENOUS | Status: AC
  Administered 2024-12-13: 1000 mg via INTRAVENOUS

## 2024-12-13 MED ORDER — SORBITOL 70 % SOLN: 30.0000 mL | Freq: Every day | Status: DC | PRN

## 2024-12-13 MED ORDER — DEXAMETHASONE SOD PHOSPHATE PF 10 MG/ML IJ SOLN
INTRAMUSCULAR | Status: DC | PRN
  Administered 2024-12-13: 5 mg via INTRAVENOUS

## 2024-12-13 MED ORDER — CEFAZOLIN SODIUM-DEXTROSE 2-4 GM/100ML-% IV SOLN
INTRAVENOUS | Status: AC
  Filled 2024-12-13: qty 100

## 2024-12-13 MED ORDER — FENTANYL CITRATE (PF) 100 MCG/2ML IJ SOLN
INTRAMUSCULAR | Status: AC
  Filled 2024-12-13: qty 2

## 2024-12-13 MED ORDER — 0.9 % SODIUM CHLORIDE (POUR BTL) OPTIME
TOPICAL | Status: DC | PRN
  Administered 2024-12-13: 1000 mL

## 2024-12-13 MED ORDER — BUPIVACAINE-MELOXICAM ER 400-12 MG/14ML IJ SOLN
INTRAMUSCULAR | Status: AC
  Filled 2024-12-13: qty 1

## 2024-12-13 MED ORDER — TRANEXAMIC ACID-NACL 1000-0.7 MG/100ML-% IV SOLN
1000.0000 mg | Freq: Once | INTRAVENOUS | Status: AC
  Administered 2024-12-13: 1000 mg via INTRAVENOUS
  Filled 2024-12-13: qty 100

## 2024-12-13 MED ORDER — AMISULPRIDE (ANTIEMETIC) 5 MG/2ML IV SOLN: 10.0000 mg | Freq: Once | INTRAVENOUS | Status: DC | PRN

## 2024-12-13 MED ORDER — ACETAMINOPHEN 325 MG PO TABS: 325.0000 mg | ORAL_TABLET | Freq: Four times a day (QID) | ORAL | Status: DC | PRN

## 2024-12-13 MED ORDER — LACTATED RINGERS IV SOLN: INTRAVENOUS | Status: DC | PRN

## 2024-12-13 MED ORDER — PROPOFOL 10 MG/ML IV BOLUS
INTRAVENOUS | Status: AC
  Filled 2024-12-13: qty 20

## 2024-12-13 MED ORDER — MAGNESIUM CITRATE PO SOLN: 1.0000 | Freq: Once | ORAL | Status: DC | PRN

## 2024-12-13 MED ORDER — SODIUM CHLORIDE 0.9 % IV SOLN: INTRAVENOUS | Status: DC

## 2024-12-13 MED ORDER — LIDOCAINE 2% (20 MG/ML) 5 ML SYRINGE
INTRAMUSCULAR | Status: DC | PRN
  Administered 2024-12-13: 60 mg via INTRAVENOUS

## 2024-12-13 MED ORDER — OXYCODONE-ACETAMINOPHEN 5-325 MG PO TABS: 1.0000 | ORAL_TABLET | Freq: Two times a day (BID) | ORAL | 0 refills | Status: DC | PRN

## 2024-12-13 MED ORDER — DOXYCYCLINE HYCLATE 100 MG PO TABS
100.0000 mg | ORAL_TABLET | Freq: Two times a day (BID) | ORAL | Status: AC
  Administered 2024-12-13 – 2024-12-19 (×14): 100 mg via ORAL
  Filled 2024-12-13 (×14): qty 1

## 2024-12-13 MED ORDER — DEXAMETHASONE SOD PHOSPHATE PF 10 MG/ML IJ SOLN
INTRAMUSCULAR | Status: AC
  Filled 2024-12-13: qty 1

## 2024-12-13 MED ORDER — HYDROCODONE-ACETAMINOPHEN 5-325 MG PO TABS
1.0000 | ORAL_TABLET | Freq: Two times a day (BID) | ORAL | Status: DC | PRN
  Administered 2024-12-13: 1 via ORAL
  Administered 2024-12-14 – 2024-12-15 (×2): 2 via ORAL
  Filled 2024-12-13: qty 1
  Filled 2024-12-13 (×2): qty 2

## 2024-12-13 MED ORDER — VANCOMYCIN HCL 1 G IV SOLR
INTRAVENOUS | Status: DC | PRN
  Administered 2024-12-13: 1000 mg via TOPICAL

## 2024-12-13 MED ORDER — METHOCARBAMOL 1000 MG/10ML IJ SOLN: 500.0000 mg | Freq: Three times a day (TID) | INTRAMUSCULAR | Status: DC | PRN

## 2024-12-13 MED ORDER — POLYETHYLENE GLYCOL 3350 17 G PO PACK: 17.0000 g | PACK | Freq: Every day | ORAL | Status: DC | PRN

## 2024-12-13 MED ORDER — SODIUM CHLORIDE 0.9 % IV SOLN
100.0000 mg | Freq: Two times a day (BID) | INTRAVENOUS | Status: AC
  Administered 2024-12-13 – 2024-12-14 (×2): 100 mg via INTRAVENOUS
  Filled 2024-12-13 (×2): qty 100

## 2024-12-13 MED ORDER — TRANEXAMIC ACID 1000 MG/10ML IV SOLN: 2000.0000 mg | INTRAVENOUS | Status: DC

## 2024-12-13 MED ORDER — ALBUTEROL SULFATE (2.5 MG/3ML) 0.083% IN NEBU
3.0000 mL | INHALATION_SOLUTION | RESPIRATORY_TRACT | Status: DC | PRN
  Administered 2024-12-16 – 2024-12-20 (×2): 3 mL via RESPIRATORY_TRACT
  Filled 2024-12-13 (×2): qty 3

## 2024-12-13 MED ORDER — METOPROLOL SUCCINATE ER 50 MG PO TB24
100.0000 mg | ORAL_TABLET | Freq: Every day | ORAL | Status: DC
  Administered 2024-12-13 – 2024-12-22 (×10): 100 mg via ORAL
  Filled 2024-12-13 (×11): qty 2

## 2024-12-13 MED ORDER — ONDANSETRON HCL 4 MG/2ML IJ SOLN
INTRAMUSCULAR | Status: DC | PRN
  Administered 2024-12-13: 4 mg via INTRAVENOUS

## 2024-12-13 MED ORDER — PHENYLEPHRINE 80 MCG/ML (10ML) SYRINGE FOR IV PUSH (FOR BLOOD PRESSURE SUPPORT)
PREFILLED_SYRINGE | INTRAVENOUS | Status: DC | PRN
  Administered 2024-12-13 (×10): 120 ug via INTRAVENOUS

## 2024-12-13 MED ORDER — STERILE WATER FOR IRRIGATION IR SOLN
Status: DC | PRN
  Administered 2024-12-13: 1000 mL

## 2024-12-13 MED ORDER — FENTANYL CITRATE (PF) 50 MCG/ML IJ SOSY
25.0000 ug | PREFILLED_SYRINGE | Freq: Once | INTRAMUSCULAR | Status: AC
  Administered 2024-12-13: 25 ug via INTRAVENOUS
  Filled 2024-12-13: qty 1

## 2024-12-13 MED ORDER — FENTANYL CITRATE (PF) 100 MCG/2ML IJ SOLN
INTRAMUSCULAR | Status: DC | PRN
  Administered 2024-12-13: 50 ug via INTRAVENOUS
  Administered 2024-12-13 (×2): 25 ug via INTRAVENOUS

## 2024-12-13 MED ORDER — CEFAZOLIN SODIUM-DEXTROSE 2-4 GM/100ML-% IV SOLN
2.0000 g | Freq: Four times a day (QID) | INTRAVENOUS | Status: AC
  Administered 2024-12-13 – 2024-12-14 (×3): 2 g via INTRAVENOUS
  Filled 2024-12-13 (×3): qty 100

## 2024-12-13 MED ORDER — AMPHETAMINE-DEXTROAMPHET ER 20 MG PO CP24
20.0000 mg | ORAL_CAPSULE | Freq: Two times a day (BID) | ORAL | Status: DC
  Filled 2024-12-13: qty 1

## 2024-12-13 MED ORDER — ONDANSETRON HCL 4 MG/2ML IJ SOLN: 4.0000 mg | Freq: Four times a day (QID) | INTRAMUSCULAR | Status: DC | PRN

## 2024-12-13 MED ORDER — ENSURE PLUS HIGH PROTEIN PO LIQD
237.0000 mL | Freq: Two times a day (BID) | ORAL | Status: DC
  Administered 2024-12-13 – 2024-12-21 (×10): 237 mL via ORAL

## 2024-12-13 MED ORDER — TRANEXAMIC ACID 1000 MG/10ML IV SOLN
INTRAVENOUS | Status: DC | PRN
  Administered 2024-12-13: 2000 mg via TOPICAL

## 2024-12-13 MED ORDER — METHOCARBAMOL 500 MG PO TABS
500.0000 mg | ORAL_TABLET | Freq: Three times a day (TID) | ORAL | Status: DC | PRN
  Administered 2024-12-14 – 2024-12-21 (×13): 500 mg via ORAL
  Filled 2024-12-13 (×15): qty 1

## 2024-12-13 MED ORDER — TRANEXAMIC ACID 1000 MG/10ML IV SOLN
2000.0000 mg | Freq: Once | INTRAVENOUS | Status: DC
  Filled 2024-12-13: qty 20

## 2024-12-13 MED ORDER — DOCUSATE SODIUM 100 MG PO CAPS
100.0000 mg | ORAL_CAPSULE | Freq: Two times a day (BID) | ORAL | Status: DC
  Administered 2024-12-13 – 2024-12-22 (×5): 100 mg via ORAL
  Filled 2024-12-13 (×16): qty 1

## 2024-12-13 MED ORDER — ACETAMINOPHEN 10 MG/ML IV SOLN: 1000.0000 mg | Freq: Once | INTRAVENOUS | Status: DC | PRN

## 2024-12-13 MED ORDER — ENOXAPARIN SODIUM 30 MG/0.3ML IJ SOSY
30.0000 mg | PREFILLED_SYRINGE | Freq: Every day | INTRAMUSCULAR | Status: DC
  Administered 2024-12-14 – 2024-12-22 (×5): 30 mg via SUBCUTANEOUS
  Filled 2024-12-13 (×9): qty 0.3

## 2024-12-13 NOTE — Anesthesia Procedure Notes (Signed)
 Procedure Name: Intubation Date/Time: 12/13/2024 10:47 AM  Performed by: Vincenzo Show, CRNAPre-anesthesia Checklist: Patient identified, Emergency Drugs available, Suction available, Patient being monitored and Timeout performed Patient Re-evaluated:Patient Re-evaluated prior to induction Oxygen Delivery Method: Circle system utilized Preoxygenation: Pre-oxygenation with 100% oxygen Induction Type: IV induction Ventilation: Mask ventilation without difficulty Laryngoscope Size: Mac and 3 Grade View: Grade I Tube type: Oral Tube size: 7.0 mm Number of attempts: 1 Airway Equipment and Method: Stylet Placement Confirmation: ETT inserted through vocal cords under direct vision, positive ETCO2, CO2 detector and breath sounds checked- equal and bilateral Secured at: 21 cm Tube secured with: Tape Dental Injury: Teeth and Oropharynx as per pre-operative assessment  Comments: ATOI

## 2024-12-13 NOTE — H&P (Signed)
 " History and Physical    Casey Schmidt FMW:[id number] DOB: 03-16-1956 DOA: 12/12/2024  Patient coming from: Home.  Chief Complaint: Right hip pain.  HPI: Casey Schmidt is a 69 y.o. female with COPD, hypertension, ADHD presents to the ER after patient has been having persistent right hip pain after patient had a fall about 2 weeks ago at home.  Patient states that she tripped on some toys which was on the floor for her pet. Denies hitting her head or losing consciousness.  Patient has been having progressively worsening right hip pain to the point she found it difficult to use the bathroom because she was finding it difficult to ambulate.  ED Course: In the ER patient had CT scan C-spine and head and right hip which shows right hip fracture.  Dr. Harden orthopedic surgeon was consulted likely going for surgery today.  CT head and C-spine shows chronic T1 endplate fracture.  Labs show WBC of 13.2 hemoglobin 10.9 and platelets 387 potassium 3.1 sodium 134.  Review of Systems: As per HPI, rest all negative.   Past Medical History:  Diagnosis Date   Allergy    SEASONAL   Anal fissure    Anxiety    Arthritis    Asthma    Cataract    BILATERAL   COPD (chronic obstructive pulmonary disease) (HCC)    Emphysema of lung (HCC)    GERD (gastroesophageal reflux disease)    Hypertension    Migraines    h/o   Multiple gastric ulcers in the 70s   d/t BC powders   Pneumonia    Right calcaneal fracture 2017   ORIF in 12/2015   Tubular adenoma of colon 07/2015    Past Surgical History:  Procedure Laterality Date   ganglion cyst removal Left remotely    from wrist   ORIF CALCANEAL FRACTURE Right 12/30/2015   Dr. Josefina   RIGHT OOPHORECTOMY     SHOULDER SURGERY Left    TUBAL LIGATION     WRIST SURGERY Right 11/20/2013   fx     reports that she has quit smoking. Her smoking use included cigarettes. She has a 8 pack-year smoking history. She uses smokeless tobacco. She reports  current alcohol  use. She reports that she does not use drugs.  Allergies[1]  Family History  Problem Relation Age of Onset   Hypertension Mother    Emphysema Mother    Hypertension Father    Skin cancer Father    Stroke Father    Colon polyps Paternal Aunt    Colon cancer Maternal Grandmother    Heart disease Paternal Grandfather    Breast cancer Neg Hx    CAD Neg Hx    Esophageal cancer Neg Hx    Stomach cancer Neg Hx    Rectal cancer Neg Hx     Prior to Admission medications  Medication Sig Start Date End Date Taking? Authorizing Provider  albuterol  (PROAIR  HFA) 108 (90 Base) MCG/ACT inhaler Inhale 2 puffs into the lungs every 4 (four) hours as needed for wheezing or shortness of breath. 07/25/16  Yes Paz, Jose E, MD  alendronate (FOSAMAX) 70 MG tablet Take 70 mg by mouth once a week. 09/16/24  Yes [provider]  amphetamine -dextroamphetamine  (ADDERALL  XR) 20 MG 24 hr capsule Take 20 mg by mouth 2 (two) times daily.   Yes [provider]  metoprolol  succinate (TOPROL -XL) 50 MG 24 hr tablet Take 100 mg by mouth daily. 04/14/23  Yes [provider]  ALPRAZolam  (XANAX ) 0.5 MG tablet Take 1 tablet (0.5 mg total) by mouth 4 (four) times daily as needed for anxiety. Patient not taking: Reported on 12/13/2024 10/31/16   Amon Aloysius BRAVO, MD  ALPRAZolam  (XANAX ) 0.5 MG tablet  05/02/17   [provider]  Azelastine HCl 137 MCG/SPRAY SOLN Place 1-2 sprays into the nose. Patient not taking: Reported on 12/13/2024 10/14/24   [provider]  cetirizine (ZYRTEC) 5 MG chewable tablet Chew 5 mg by mouth daily. Patient not taking: Reported on 12/13/2024    [provider]  Cholecalciferol (VITAMIN D  PO) Take 1 tablet by mouth daily. Patient not taking: Reported on 12/13/2024    [provider]  cyclobenzaprine  (FLEXERIL ) 10 MG tablet Take 1 tablet (10 mg total) by mouth 2 (two) times daily as needed for muscle spasms. Patient not taking:  Reported on 03/30/2023 09/14/17   Vicky Charleston, PA-C  Fluticasone -Salmeterol (ADVAIR) 250-50 MCG/DOSE AEPB Inhale 1 puff into the lungs 2 (two) times daily. 08/16/16   Saguier, [place], PA-C  methadone (DOLOPHINE) 10 MG/5ML solution Take 55 mg by mouth daily. Patient not taking: Reported on 12/13/2024    [provider]  montelukast (SINGULAIR) 10 MG tablet Take 10 mg by mouth at bedtime. Patient not taking: Reported on 12/13/2024 09/16/24   [provider]  Multiple Vitamin (MULTIVITAMIN) tablet Take 1 tablet by mouth daily. Patient not taking: Reported on 12/13/2024    [provider]  omeprazole  (PRILOSEC) 40 MG capsule Take 1 capsule (40 mg total) by mouth 2 (two) times daily. Patient not taking: Reported on 12/13/2024 03/30/23   Beather Delon Gibson, PA  OVER THE COUNTER MEDICATION Vitamin D  3. Once daily. Patient not taking: Reported on 12/13/2024    [provider]  ranitidine  (ZANTAC ) 300 MG tablet Take 1 tablet (300 mg total) by mouth at bedtime. Patient not taking: Reported on 05/15/2023 07/04/16   Paz, Jose E, MD  SYMBICORT  160-4.5 MCG/ACT inhaler Inhale 2 puffs into the lungs 2 (two) times daily. Patient not taking: Reported on 12/13/2024 07/27/17   [provider]    Physical Exam: Constitutional: Moderately built and nourished. Vitals:   12/12/24 2300 12/12/24 2315 12/13/24 0125 12/13/24 0215  BP: (!) 161/81 (!) 151/61  (!) 166/87  Pulse:  86  89  Resp: 13 (!) 21  15  Temp:   98.5 F (36.9 C)   TempSrc:   Oral   SpO2:  96%  97%   Eyes: Anicteric no pallor. ENMT: No discharge from the ears eyes nose or mouth. Neck: No mass felt.  No neck rigidity. Respiratory: No rhonchi or crepitations. Cardiovascular: S1-S2 heard. Abdomen: Soft nontender bowel sound present. Musculoskeletal: No edema. Skin: No rash. Neurologic: Alert awake oriented time place and person.  Moves all extremities. Psychiatric: Appears normal.  Normal  affect.   Labs on Admission: I have personally reviewed following labs and imaging studies  CBC: Recent Labs  Lab 12/12/24 2313  WBC 13.2*  NEUTROABS 11.8*  HGB 10.9*  HCT 33.9*  MCV 86.7  PLT 387   Basic Metabolic Panel: Recent Labs  Lab 12/12/24 2313  NA 134*  K 3.1*  CL 98  CO2 25  GLUCOSE 105*  BUN 30*  CREATININE 0.77  CALCIUM  8.2*   GFR: CrCl cannot be calculated (Unknown ideal weight.). Liver Function Tests: No results for input(s): AST, ALT, ALKPHOS, BILITOT, PROT, ALBUMIN  in the last 168 hours. No results for input(s): LIPASE, AMYLASE in the last 168  hours. No results for input(s): AMMONIA in the last 168 hours. Coagulation Profile: No results for input(s): INR, PROTIME in the last 168 hours. Cardiac Enzymes: No results for input(s): CKTOTAL, CKMB, CKMBINDEX, TROPONINI in the last 168 hours. BNP (last 3 results) No results for input(s): PROBNP in the last 8760 hours. HbA1C: No results for input(s): HGBA1C in the last 72 hours. CBG: No results for input(s): GLUCAP in the last 168 hours. Lipid Profile: No results for input(s): CHOL, HDL, LDLCALC, TRIG, CHOLHDL, LDLDIRECT in the last 72 hours. Thyroid  Function Tests: No results for input(s): TSH, T4TOTAL, FREET4, T3FREE, THYROIDAB in the last 72 hours. Anemia Panel: No results for input(s): VITAMINB12, FOLATE, FERRITIN, TIBC, IRON, RETICCTPCT in the last 72 hours. Urine analysis: No results found for: COLORURINE, APPEARANCEUR, LABSPEC, PHURINE, GLUCOSEU, HGBUR, BILIRUBINUR, KETONESUR, PROTEINUR, UROBILINOGEN, NITRITE, LEUKOCYTESUR Sepsis Labs: @LABRCNTIP (procalcitonin:4,lacticidven:4) )No results found for this or any previous visit (from the past 240 hours).   Radiological Exams on Admission: CT HEAD WO CONTRAST Result Date: 12/13/2024 EXAM: CT HEAD WITHOUT CONTRAST 12/13/2024 01:29:25 AM TECHNIQUE: CT of the  head was performed without the administration of intravenous contrast. Automated exposure control, iterative reconstruction, and/or weight based adjustment of the mA/kV was utilized to reduce the radiation dose to as low as reasonably achievable. COMPARISON: None available. CLINICAL HISTORY: Head trauma, moderate to severe. FINDINGS: BRAIN AND VENTRICLES: No acute hemorrhage. No evidence of acute infarct. No hydrocephalus. No extra-axial collection. No mass effect or midline shift. Patchy and confluent areas of decreased attenuation are noted throughout the deep and periventricular white matter of the cerebral hemispheres bilaterally suggestive of chronic microvascular ischemic changes. ORBITS: Bilateral lens replacement. SINUSES: No acute abnormality. SOFT TISSUES AND SKULL: No acute soft tissue abnormality. No skull fracture. IMPRESSION: 1. No acute intracranial abnormality. Electronically signed by: Morgane Naveau MD 12/13/2024 01:53 AM EST RP Workstation: [id number]   CT CERVICAL SPINE WO CONTRAST Result Date: 12/13/2024 EXAM: CT CERVICAL SPINE WITHOUT CONTRAST 12/13/2024 01:29:25 AM TECHNIQUE: CT of the cervical spine was performed without the administration of intravenous contrast. Multiplanar reformatted images are provided for review. Automated exposure control, iterative reconstruction, and/or weight based adjustment of the mA/kV was utilized to reduce the radiation dose to as low as reasonably achievable. COMPARISON: None available. CLINICAL HISTORY: Polytrauma, blunt. FINDINGS: BONES AND ALIGNMENT: Diffusely decreased bone density. C6 vertebral body hemangioma. Chronic T1 superior endplate compression fracture. Grade 1 anterolisthesis of C4 on C5. No acute fracture or traumatic malalignment. DEGENERATIVE CHANGES: Multilevel moderate degenerative change of the spine. Multilevel intervertebral disc space narrowing. No severe osseous, neural foraminal, or central canal stenosis. SOFT TISSUES: No  prevertebral soft tissue swelling. Emphysema. IMPRESSION: 1. No acute cervical spine fracture or traumatic malalignment. 2. Chronic T1 superior endplate compression fracture. Electronically signed by: Morgane Naveau MD 12/13/2024 01:52 AM EST RP Workstation: [id number]   CT Hip Right Wo Contrast Result Date: 12/13/2024 EXAM: CT OF THE RIGHT HIP WITHOUT IV CONTRAST 12/13/2024 01:29:25 AM TECHNIQUE: CT of the right hip was performed without the administration of intravenous contrast. Multiplanar reformatted images are provided for review. Automated exposure control, iterative reconstruction, and/or weight based adjustment of the mA/kV was utilized to reduce the radiation dose to as low as reasonably achievable. COMPARISON: CT abdomen 11/20/2006, x-ray right femur 12/12/2024. CLINICAL HISTORY: Hip trauma, fracture suspected, x-ray done. FINDINGS: BONES: Acute comminuted, displaced, and impacted right femoral neck fracture extending to the lesser trochanter and head. No dislocation. No aggressive appearing osseous abnormality or periostitis. SOFT TISSUE: Redemonstration of  nonspecific 3.2 cm calcification anterior to the hip joint (11:32; 13:56). No significant soft tissue edema or fluid collections. No soft tissue mass. JOINT: No significant degenerative changes. No osseous erosions. INTRAPELVIC CONTENTS: Limited images of the intrapelvic contents are unremarkable. IMPRESSION: 1. Acute comminuted, displaced, and impacted right femoral neck fracture extending to the lesser trochanter and head. Electronically signed by: Morgane Naveau MD 12/13/2024 01:48 AM EST RP Workstation: [id number]   CT CHEST ABDOMEN PELVIS W CONTRAST Result Date: 12/13/2024 EXAM: CT CHEST WITH CONTRAST 12/13/2024 01:29:25 AM TECHNIQUE: CT of the chest was performed with the administration of 75 mL of intravenous iohexol  (OMNIPAQUE ) 300 MG/ML solution. Multiplanar reformatted images are provided for review. Automated exposure control,  iterative reconstruction, and/or weight based adjustment of the mA/kV was utilized to reduce the radiation dose to as low as reasonably achievable. COMPARISON: X-ray right femur 12/12/2024 CLINICAL HISTORY: Polytrauma, blunt. FINDINGS: MEDIASTINUM: Heart and pericardium are unremarkable. The central airways are clear. No pneumomediastinum. No anterior mediastinal hematoma. Severe atherosclerotic plaque of the aorta. LYMPH NODES: No mediastinal, hilar or axillary lymphadenopathy. LUNGS AND PLEURA: At least moderate centrilobular emphysema. Diffuse bronchial wall thickening. No focal consolidation or pulmonary edema. No pleural effusion or pneumothorax. SOFT TISSUES/BONES: Multiple old healed nondisplaced bilateral anterolateral rib fractures likely related to prior CPR. Healed right humeral head and neck fracture. Old nonunionized proximal sternal body fracture. Previously decreased bone density. Chronic-appearing anterior vertebral body compression fracture of the T5 level. Chronic-appearing concavity of the superior endplate of the T6 vertebral body with associated prominent Schmorl node. Old healed left inferior pubic rami fracture. Markedly comminuted and displaced right femoral neck fracture. Fracture extends to the lesser trochanter and head. Grade 1 anterolisthesis of L4 on L5 and L5 on S1. Age indeterminate, possibly acute, impacted, no acute fracture or dislocation of the bones of the pelvis. No diastasis of the pelvic bones. No sacral fracture. No thoracolumbar spine fracture or traumatic listhesis. No acute displaced rib fracture. No hip or shoulder dislocation. Slightly limited evaluation due to motion artifact. UPPER ABDOMEN: Hypodensity of the right hepatic lobe that further fills in on the delayed view consistent with a hepatic hemangioma. Atrophic left kidney. Slightly enlarged compensatory right renal parenchyma. No filling defects of the partially visualized right collecting system. No small or  large bowel thickening or dilatation. The appendix is not definitely identified with no inflammatory changes in the right lower quadrant to suggest acute appendicitis. Medially mobilized cecum. Diffusely atrophic pancreas. No focal lesion. Otherwise normal pancreatic contour. No surrounding inflammatory changes. No main pancreatic ductal dilatation. IMPRESSION: 1. Markedly comminuted and displaced right femoral neck fracture extending to the lesser trochanter and head. 2. No intrathoracic, intraabdominal, intrapelvic traumatic acute findings. 3. Severe atherosclerotic plaque of the aorta. Electronically signed by: Morgane Naveau MD 12/13/2024 01:44 AM EST RP Workstation: [id number]   DG Foot 2 Views Right Result Date: 12/12/2024 EXAM: 1 OR 2 VIEW(S) XRAY OF THE FOOT 12/12/2024 10:10:00 PM COMPARISON: 12/07/2015 CLINICAL HISTORY: Fall, pain. FINDINGS: BONES AND JOINTS: Screw fixation of the calcaneus in place. Degenerative changes of the first MTP joint. Diffuse bone demineralization. No acute fracture. No malalignment. SOFT TISSUES: Soft tissue swelling of the forefoot. IMPRESSION: 1. No acute fracture or dislocation. 2. Soft tissue swelling of the forefoot. 3. Screw fixation of the calcaneus is present. 4. Diffuse bone demineralization. Electronically signed by: Elsie Gravely MD 12/12/2024 10:20 PM EST RP Workstation: [id number]   DG Ankle Complete Right Result Date: 12/12/2024 EXAM: 3 OR MORE VIEW(S) XRAY  OF THE RIGHT ANKLE 12/12/2024 10:10:00 PM CLINICAL HISTORY: Fall, pain. COMPARISON: Right foot 12/07/2015. FINDINGS: BONES AND JOINTS: Calcaneal screw in place. First MTP degenerative changes. Degenerative changes in the intertarsal and subtalar joints. Diffuse bone demineralization. No acute fracture. No malalignment. SOFT TISSUES: Soft tissue swelling at the lateral malleolus. IMPRESSION: 1. No acute fracture or dislocation. 2. Soft tissue swelling at the lateral malleolus. 3. Calcaneal screw in place.  Electronically signed by: Elsie Gravely MD 12/12/2024 10:19 PM EST RP Workstation: [id number]   DG Tibia/Fibula Right Result Date: 12/12/2024 EXAM: 2 VIEW(S) XRAY OF THE RIGHT TIBIA AND FIBULA 12/12/2024 10:10:00 PM COMPARISON: Right knee 09/14/2017. CLINICAL HISTORY: Fall, pain. FINDINGS: BONES AND JOINTS: Surgical screw in talus noted. No acute fracture. No malalignment. SOFT TISSUES: Unremarkable. IMPRESSION: 1. No acute fracture or dislocation. Electronically signed by: Elsie Gravely MD 12/12/2024 10:17 PM EST RP Workstation: [id number]   DG Femur Min 2 Views Right Result Date: 12/12/2024 EXAM: 2 VIEW(S) XRAY OF THE RIGHT FEMUR 12/12/2024 10:10:00 PM COMPARISON: Right knee 09/14/2017. CLINICAL HISTORY: Fall, pain. FINDINGS: BONES AND JOINTS: Possible acute impacted fracture of the right femoral head and neck with varus angulation. Degenerative changes of both hips. SOFT TISSUES: Unremarkable. LIMITATIONS/ARTIFACTS: Visualization is limited due to crosstable technique. RECOMMENDATIONS: Dedicated views of the right hip are recommended for better visualization. IMPRESSION: 1. Possible acute impacted fracture of the right femoral head and neck with varus angulation. Visualization is limited due to crosstable technique. Dedicated views of the right hip are recommended for better visualization. Electronically signed by: Elsie Gravely MD 12/12/2024 10:17 PM EST RP Workstation: [id number]    EKG: Independently reviewed.  Normal sinus rhythm.  PVCs.  Assessment/Plan Principal Problem:   Hip fracture (HCC) Active Problems:   COPD (chronic obstructive pulmonary disease) (HCC)   Anemia   Essential hypertension   ADHD    Right hip fracture after mechanical fall for which Dr. Harden orthopedic surgeon has been consulted.  Will keep patient n.p.o. in anticipation of surgery.  Pain relief medications. Chronic T1 endplate fracture. COPD presently not wheezing takes as needed albuterol . Hypertension on  metoprolol . ADHD on Adderall . Anemia appears to be new when compared to labs in Care Everywhere in October 2025.  Follow CBC. Mild hyponatremia and hypokalemia.  Could be from dehydration.  Replace and recheck. Leukocytosis could be reactionary.  No signs of infection.  Since patient has had right hip fracture will need further management and more than 2 midnight stay.   DVT prophylaxis: SCDs. Code Status: Full code. Family Communication: Discussed with patient. Disposition Plan: Medical floor. Consults called: Orthopedics. Admission status: Inpatient.         [1]  Allergies Allergen Reactions   Amoxicillin  Other (See Comments)    Blood in stools. Pt states she can't take any cillins  (?? See OV note 06-29-15)   Aspirin Other (See Comments)    Burns stomach   Clarithromycin     REACTION: GI upset   Codeine     REACTION: GI upset   Erythromycin     REACTION: GI upset   Nsaids     REACTION: GI upset   Tolmetin Other (See Comments)    REACTION: GI upset   Tramadol Other (See Comments)    Burns stomach   "

## 2024-12-13 NOTE — ED Provider Notes (Signed)
 3:21 AM Dr. Harden will have someone from team round and repair keep NPO   Cybill Uriegas, MD 12/13/24 [id number]

## 2024-12-13 NOTE — Plan of Care (Signed)
 PLAN OF CARE NOTE  Casey Schmidt is a 69 y.o. female with COPD, hypertension, ADHD who presented to the ER with persistent right hip pain after a fall at home about 2 weeks ago.  The patient was found to have a right hip fracture.  Scheduled for surgery 1/24.  Patient evaluated postsurgery.  Left elbow redness and swelling noted.  Occlusive dressing in place right hip.  -Continue pain management per orthopedics. - Start doxycycline  for left elbow cellulitis. - Replete potassium.   MDALA-GAUSI, GOLDEN PILLOW, MD 12/13/2024 3:07 PM

## 2024-12-13 NOTE — H&P (Signed)

## 2024-12-13 NOTE — Anesthesia Postprocedure Evaluation (Signed)
"   Anesthesia Post Note  Patient: Casey Schmidt  Procedure(s) Performed: ARTHROPLASTY, HIP, TOTAL, ANTERIOR APPROACH (Right: Hip)     Patient location during evaluation: PACU Anesthesia Type: General Level of consciousness: awake Pain management: pain level controlled Vital Signs Assessment: post-procedure vital signs reviewed and stable Respiratory status: spontaneous breathing, nonlabored ventilation and respiratory function stable Cardiovascular status: blood pressure returned to baseline and stable Postop Assessment: no apparent nausea or vomiting Anesthetic complications: no   No notable events documented.  Last Vitals:  Vitals:   12/13/24 1345 12/13/24 1432  BP: (!) 172/101 (!) 155/99  Pulse: 86 98  Resp: 14 18  Temp:  36.5 C  SpO2: 97% 97%    Last Pain:  Vitals:   12/13/24 1523  TempSrc:   PainSc: 9                  Tyrone Balash P [place]      "

## 2024-12-13 NOTE — Consult Note (Signed)
 "  ORTHOPAEDIC CONSULTATION  REQUESTING PHYSICIAN: Mdala-Gausi, Masiku Agat*  Chief Complaint: Right hip fracture  HPI: Patient presented with right hip fx after mechanical fall 3 weeks ago.  Called EMS at that time but declined transfer and evaluation.  Pain has gotten worse over time and came to ED last night.  Xrays showed right femoral neck fracture.   Ortho consulted for surgical evaluation.  Past Medical History:  Diagnosis Date   Allergy    SEASONAL   Anal fissure    Anxiety    Arthritis    Asthma    Cataract    BILATERAL   COPD (chronic obstructive pulmonary disease) (HCC)    Emphysema of lung (HCC)    GERD (gastroesophageal reflux disease)    Hypertension    Migraines    h/o   Multiple gastric ulcers in the 70s   d/t BC powders   Pneumonia    Right calcaneal fracture 2017   ORIF in 12/2015   Tubular adenoma of colon 07/2015   Past Surgical History:  Procedure Laterality Date   ganglion cyst removal Left remotely    from wrist   ORIF CALCANEAL FRACTURE Right 12/30/2015   Dr. Josefina   RIGHT OOPHORECTOMY     SHOULDER SURGERY Left    TUBAL LIGATION     WRIST SURGERY Right 11/20/2013   fx   Social History   Socioeconomic History   Marital status: Widowed    Spouse name: Not on file   Number of children: 2   Years of education: Not on file   Highest education level: Not on file  Occupational History   Occupation: retired-disability d/t anxiety-djd  Tobacco Use   Smoking status: Former    Current packs/day: 0.25    Average packs/day: 0.3 packs/day for 32.0 years (8.0 ttl pk-yrs)    Types: Cigarettes   Smokeless tobacco: Current   Tobacco comments:    smoking again as of 09-2014 Patient states she Vapes.   Vaping Use   Vaping status: Every Day  Substance and Sexual Activity   Alcohol  use: Yes    Comment: socially   Drug use: No   Sexual activity: Not on file  Other Topics Concern   Not on file  Social History Narrative   Lost husband 2014, son  lives w/ her, has a boyfriend   Social Drivers of Health   Tobacco Use: High Risk (12/13/2024)   Patient History    Smoking Tobacco Use: Former    Smokeless Tobacco Use: Current    Passive Exposure: Not on Actuary Strain: Not on file  Food Insecurity: Low Risk (04/07/2024)   Received from [facility]   Epic    Within the past 12 months, the food you bought just didn't last and you didn't have money to get more. : Never true    Within the past 12 months, you worried that your food would run out before you got money to buy more: Never true  Transportation Needs: No Transportation Needs (04/07/2024)   Received from [facility]    In the past 12 months, has lack of reliable transportation kept you from medical appointments, meetings, work or from getting things needed for daily living? : No  Physical Activity: Sufficiently Active (06/16/2022)   Received from [facility] visits prior to 01/20/2023., [facility]   Exercise Vital Sign    On average, how many days per week do you engage in  moderate to strenuous exercise (like a brisk walk)?: 7 days    On average, how many minutes do you engage in exercise at this level?: 50 min  Stress: No Stress Concern Present (06/16/2022)   Received from [facility], [facility] visits prior to 01/20/2023.   [employer] - Occupational Stress Questionnaire    Feeling of Stress : Not at all  Social Connections: Not on file  Depression (PHQ2-9): Not on file  Alcohol  Screen: Not on file  Housing: Low Risk (04/07/2024)   Received from [facility]   Epic    What is your living situation today?: I have a steady place to live    Think about the place you live. Do you have problems with any of the following? Choose all that apply:: None/None on this list  Utilities: Low Risk (04/07/2024)   Received from [facility]   Utilities    In the past 12 months  has the electric, gas, oil, or water  company threatened to shut off services in your home? : No  Health Literacy: Not on file   Family History  Problem Relation Age of Onset   Hypertension Mother    Emphysema Mother    Hypertension Father    Skin cancer Father    Stroke Father    Colon polyps Paternal Aunt    Colon cancer Maternal Grandmother    Heart disease Paternal Grandfather    Breast cancer Neg Hx    CAD Neg Hx    Esophageal cancer Neg Hx    Stomach cancer Neg Hx    Rectal cancer Neg Hx    Allergies[1] Prior to Admission medications  Medication Sig Start Date End Date Taking? Authorizing Provider  albuterol  (PROAIR  HFA) 108 (90 Base) MCG/ACT inhaler Inhale 2 puffs into the lungs every 4 (four) hours as needed for wheezing or shortness of breath. 07/25/16  Yes Paz, Jose E, MD  alendronate (FOSAMAX) 70 MG tablet Take 70 mg by mouth once a week. 09/16/24  Yes [provider]  amphetamine -dextroamphetamine  (ADDERALL  XR) 20 MG 24 hr capsule Take 20 mg by mouth 2 (two) times daily.   Yes [provider]  metoprolol  succinate (TOPROL -XL) 50 MG 24 hr tablet Take 100 mg by mouth daily. 04/14/23  Yes [provider]  ALPRAZolam  (XANAX ) 0.5 MG tablet Take 1 tablet (0.5 mg total) by mouth 4 (four) times daily as needed for anxiety. Patient not taking: Reported on 12/13/2024 10/31/16   Amon Aloysius BRAVO, MD  ALPRAZolam  (XANAX ) 0.5 MG tablet  05/02/17   [provider]  Azelastine HCl 137 MCG/SPRAY SOLN Place 1-2 sprays into the nose. Patient not taking: Reported on 12/13/2024 10/14/24   [provider]  cetirizine (ZYRTEC) 5 MG chewable tablet Chew 5 mg by mouth daily. Patient not taking: Reported on 12/13/2024    [provider]  Cholecalciferol (VITAMIN D  PO) Take 1 tablet by mouth daily. Patient not taking: Reported on 12/13/2024    [provider]  cyclobenzaprine  (FLEXERIL ) 10 MG tablet Take 1 tablet (10 mg total) by mouth 2 (two)  times daily as needed for muscle spasms. Patient not taking: Reported on 03/30/2023 09/14/17   Vicky Charleston, PA-C  Fluticasone -Salmeterol (ADVAIR) 250-50 MCG/DOSE AEPB Inhale 1 puff into the lungs 2 (two) times daily. 08/16/16   Saguier, [place], PA-C  methadone (DOLOPHINE) 10 MG/5ML solution Take 55 mg by mouth daily. Patient not taking: Reported on 12/13/2024    [provider]  montelukast (SINGULAIR) 10 MG tablet Take 10 mg by mouth at bedtime. Patient not taking: Reported on 12/13/2024 09/16/24   [provider]  Multiple Vitamin (MULTIVITAMIN) tablet Take 1 tablet by mouth daily. Patient not taking: Reported on 12/13/2024    [provider]  omeprazole  (PRILOSEC) 40 MG capsule Take 1 capsule (40 mg total) by mouth 2 (two) times daily. Patient not taking: Reported on 12/13/2024 03/30/23   Beather Delon Gibson, PA  OVER THE COUNTER MEDICATION Vitamin D  3. Once daily. Patient not taking: Reported on 12/13/2024    [provider]  ranitidine  (ZANTAC ) 300 MG tablet Take 1 tablet (300 mg total) by mouth at bedtime. Patient not taking: Reported on 05/15/2023 07/04/16   Paz, Jose E, MD  SYMBICORT  160-4.5 MCG/ACT inhaler Inhale 2 puffs into the lungs 2 (two) times daily. Patient not taking: Reported on 12/13/2024 07/27/17   [provider]   CT HEAD WO CONTRAST Result Date: 12/13/2024 EXAM: CT HEAD WITHOUT CONTRAST 12/13/2024 01:29:25 AM TECHNIQUE: CT of the head was performed without the administration of intravenous contrast. Automated exposure control, iterative reconstruction, and/or weight based adjustment of the mA/kV was utilized to reduce the radiation dose to as low as reasonably achievable. COMPARISON: None available. CLINICAL HISTORY: Head trauma, moderate to severe. FINDINGS: BRAIN AND VENTRICLES: No acute hemorrhage. No evidence of acute infarct. No hydrocephalus. No extra-axial collection. No mass effect or midline shift. Patchy and confluent areas  of decreased attenuation are noted throughout the deep and periventricular white matter of the cerebral hemispheres bilaterally suggestive of chronic microvascular ischemic changes. ORBITS: Bilateral lens replacement. SINUSES: No acute abnormality. SOFT TISSUES AND SKULL: No acute soft tissue abnormality. No skull fracture. IMPRESSION: 1. No acute intracranial abnormality. Electronically signed by: Morgane Naveau MD 12/13/2024 01:53 AM EST RP Workstation: [id number]   CT CERVICAL SPINE WO CONTRAST Result Date: 12/13/2024 EXAM: CT CERVICAL SPINE WITHOUT CONTRAST 12/13/2024 01:29:25 AM TECHNIQUE: CT of the cervical spine was performed without the administration of intravenous contrast. Multiplanar reformatted images are provided for review. Automated exposure control, iterative reconstruction, and/or weight based adjustment of the mA/kV was utilized to reduce the radiation dose to as low as reasonably achievable. COMPARISON: None available. CLINICAL HISTORY: Polytrauma, blunt. FINDINGS: BONES AND ALIGNMENT: Diffusely decreased bone density. C6 vertebral body hemangioma. Chronic T1 superior endplate compression fracture. Grade 1 anterolisthesis of C4 on C5. No acute fracture or traumatic malalignment. DEGENERATIVE CHANGES: Multilevel moderate degenerative change of the spine. Multilevel intervertebral disc space narrowing. No severe osseous, neural foraminal, or central canal stenosis. SOFT TISSUES: No prevertebral soft tissue swelling. Emphysema. IMPRESSION: 1. No acute cervical spine fracture or traumatic malalignment. 2. Chronic T1 superior endplate compression fracture. Electronically signed by: Morgane Naveau MD 12/13/2024 01:52 AM EST RP Workstation: [id number]   CT Hip Right Wo Contrast Result Date: 12/13/2024 EXAM: CT OF THE RIGHT HIP WITHOUT IV CONTRAST 12/13/2024 01:29:25 AM TECHNIQUE: CT of the right hip was performed without the administration of intravenous contrast. Multiplanar reformatted images  are provided for review. Automated exposure control, iterative reconstruction, and/or weight based adjustment of the mA/kV was utilized to reduce the radiation dose to as low as reasonably achievable. COMPARISON: CT abdomen 11/20/2006, x-ray right femur 12/12/2024. CLINICAL HISTORY: Hip trauma, fracture suspected, x-ray done. FINDINGS: BONES: Acute comminuted, displaced, and impacted right femoral neck fracture extending to the lesser trochanter and head. No dislocation. No aggressive appearing osseous abnormality or periostitis. SOFT TISSUE: Redemonstration of nonspecific 3.2 cm calcification anterior to the  hip joint (11:32; 13:56). No significant soft tissue edema or fluid collections. No soft tissue mass. JOINT: No significant degenerative changes. No osseous erosions. INTRAPELVIC CONTENTS: Limited images of the intrapelvic contents are unremarkable. IMPRESSION: 1. Acute comminuted, displaced, and impacted right femoral neck fracture extending to the lesser trochanter and head. Electronically signed by: Morgane Naveau MD 12/13/2024 01:48 AM EST RP Workstation: [id number]   CT CHEST ABDOMEN PELVIS W CONTRAST Result Date: 12/13/2024 EXAM: CT CHEST WITH CONTRAST 12/13/2024 01:29:25 AM TECHNIQUE: CT of the chest was performed with the administration of 75 mL of intravenous iohexol  (OMNIPAQUE ) 300 MG/ML solution. Multiplanar reformatted images are provided for review. Automated exposure control, iterative reconstruction, and/or weight based adjustment of the mA/kV was utilized to reduce the radiation dose to as low as reasonably achievable. COMPARISON: X-ray right femur 12/12/2024 CLINICAL HISTORY: Polytrauma, blunt. FINDINGS: MEDIASTINUM: Heart and pericardium are unremarkable. The central airways are clear. No pneumomediastinum. No anterior mediastinal hematoma. Severe atherosclerotic plaque of the aorta. LYMPH NODES: No mediastinal, hilar or axillary lymphadenopathy. LUNGS AND PLEURA: At least moderate  centrilobular emphysema. Diffuse bronchial wall thickening. No focal consolidation or pulmonary edema. No pleural effusion or pneumothorax. SOFT TISSUES/BONES: Multiple old healed nondisplaced bilateral anterolateral rib fractures likely related to prior CPR. Healed right humeral head and neck fracture. Old nonunionized proximal sternal body fracture. Previously decreased bone density. Chronic-appearing anterior vertebral body compression fracture of the T5 level. Chronic-appearing concavity of the superior endplate of the T6 vertebral body with associated prominent Schmorl node. Old healed left inferior pubic rami fracture. Markedly comminuted and displaced right femoral neck fracture. Fracture extends to the lesser trochanter and head. Grade 1 anterolisthesis of L4 on L5 and L5 on S1. Age indeterminate, possibly acute, impacted, no acute fracture or dislocation of the bones of the pelvis. No diastasis of the pelvic bones. No sacral fracture. No thoracolumbar spine fracture or traumatic listhesis. No acute displaced rib fracture. No hip or shoulder dislocation. Slightly limited evaluation due to motion artifact. UPPER ABDOMEN: Hypodensity of the right hepatic lobe that further fills in on the delayed view consistent with a hepatic hemangioma. Atrophic left kidney. Slightly enlarged compensatory right renal parenchyma. No filling defects of the partially visualized right collecting system. No small or large bowel thickening or dilatation. The appendix is not definitely identified with no inflammatory changes in the right lower quadrant to suggest acute appendicitis. Medially mobilized cecum. Diffusely atrophic pancreas. No focal lesion. Otherwise normal pancreatic contour. No surrounding inflammatory changes. No main pancreatic ductal dilatation. IMPRESSION: 1. Markedly comminuted and displaced right femoral neck fracture extending to the lesser trochanter and head. 2. No intrathoracic, intraabdominal, intrapelvic  traumatic acute findings. 3. Severe atherosclerotic plaque of the aorta. Electronically signed by: Morgane Naveau MD 12/13/2024 01:44 AM EST RP Workstation: [id number]   DG Foot 2 Views Right Result Date: 12/12/2024 EXAM: 1 OR 2 VIEW(S) XRAY OF THE FOOT 12/12/2024 10:10:00 PM COMPARISON: 12/07/2015 CLINICAL HISTORY: Fall, pain. FINDINGS: BONES AND JOINTS: Screw fixation of the calcaneus in place. Degenerative changes of the first MTP joint. Diffuse bone demineralization. No acute fracture. No malalignment. SOFT TISSUES: Soft tissue swelling of the forefoot. IMPRESSION: 1. No acute fracture or dislocation. 2. Soft tissue swelling of the forefoot. 3. Screw fixation of the calcaneus is present. 4. Diffuse bone demineralization. Electronically signed by: Elsie Gravely MD 12/12/2024 10:20 PM EST RP Workstation: [id number]   DG Ankle Complete Right Result Date: 12/12/2024 EXAM: 3 OR MORE VIEW(S) XRAY OF THE RIGHT ANKLE 12/12/2024 10:10:00 PM  CLINICAL HISTORY: Fall, pain. COMPARISON: Right foot 12/07/2015. FINDINGS: BONES AND JOINTS: Calcaneal screw in place. First MTP degenerative changes. Degenerative changes in the intertarsal and subtalar joints. Diffuse bone demineralization. No acute fracture. No malalignment. SOFT TISSUES: Soft tissue swelling at the lateral malleolus. IMPRESSION: 1. No acute fracture or dislocation. 2. Soft tissue swelling at the lateral malleolus. 3. Calcaneal screw in place. Electronically signed by: Elsie Gravely MD 12/12/2024 10:19 PM EST RP Workstation: [id number]   DG Tibia/Fibula Right Result Date: 12/12/2024 EXAM: 2 VIEW(S) XRAY OF THE RIGHT TIBIA AND FIBULA 12/12/2024 10:10:00 PM COMPARISON: Right knee 09/14/2017. CLINICAL HISTORY: Fall, pain. FINDINGS: BONES AND JOINTS: Surgical screw in talus noted. No acute fracture. No malalignment. SOFT TISSUES: Unremarkable. IMPRESSION: 1. No acute fracture or dislocation. Electronically signed by: Elsie Gravely MD 12/12/2024 10:17 PM  EST RP Workstation: [id number]   DG Femur Min 2 Views Right Result Date: 12/12/2024 EXAM: 2 VIEW(S) XRAY OF THE RIGHT FEMUR 12/12/2024 10:10:00 PM COMPARISON: Right knee 09/14/2017. CLINICAL HISTORY: Fall, pain. FINDINGS: BONES AND JOINTS: Possible acute impacted fracture of the right femoral head and neck with varus angulation. Degenerative changes of both hips. SOFT TISSUES: Unremarkable. LIMITATIONS/ARTIFACTS: Visualization is limited due to crosstable technique. RECOMMENDATIONS: Dedicated views of the right hip are recommended for better visualization. IMPRESSION: 1. Possible acute impacted fracture of the right femoral head and neck with varus angulation. Visualization is limited due to crosstable technique. Dedicated views of the right hip are recommended for better visualization. Electronically signed by: Elsie Gravely MD 12/12/2024 10:17 PM EST RP Workstation: [id number]    All pertinent xrays, MRI, CT independently reviewed and interpreted  Positive ROS: All other systems have been reviewed and were otherwise negative with the exception of those mentioned in the HPI and as above.  Physical Exam: General: No acute distress Cardiovascular: No pedal edema Respiratory: No cyanosis, no use of accessory musculature GI: No organomegaly, abdomen is soft and non-tender Skin: No lesions in the area of chief complaint Neurologic: Sensation intact distally Psychiatric: Patient is at baseline mood and affect Lymphatic: No axillary or cervical lymphadenopathy  MUSCULOSKELETAL:  - severe pain with movement of the hip and extremity - hip flexed up to avoid pain - skin intact with light bruising - NVI distally - compartments soft  Assessment: Right femoral neck fracture Left elbow pain, possible cellulitis  Plan: - surgical treatment is recommended for pain relief, quality of life and early mobilization - patient is aware of r/b/a and wish to proceed, informed consent obtained - medical  optimization per primary team - detailed surgical plan discussed including r/b/a - recommend doxy for left elbow cellulitis  Thank you for the consult and the opportunity to see Ms. Clevinger  N. Ozell Cummins, MD [facility] 10:15 AM         [1]  Allergies Allergen Reactions   Amoxicillin  Other (See Comments)    Blood in stools. Pt states she can't take any cillins  (?? See OV note 06-29-15)   Aspirin Other (See Comments)    Burns stomach   Clarithromycin     REACTION: GI upset   Codeine     REACTION: GI upset   Erythromycin     REACTION: GI upset   Nsaids     REACTION: GI upset   Tolmetin Other (See Comments)    REACTION: GI upset   Tramadol Other (See Comments)    Burns stomach   "

## 2024-12-13 NOTE — Transfer of Care (Signed)
 Immediate Anesthesia Transfer of Care Note  Patient: Casey Schmidt  Procedure(s) Performed: Procedures: ARTHROPLASTY, HIP, TOTAL, ANTERIOR APPROACH (Right)  Patient Location: PACU  Anesthesia Type:General  Level of Consciousness:  sedated, patient cooperative and responds to stimulation  Airway & Oxygen Therapy:Patient Spontanous Breathing and Patient connected to face mask oxgen  Post-op Assessment:  Report given to PACU RN and Post -op Vital signs reviewed and stable  Post vital signs:  Reviewed and stable  Last Vitals:  Vitals:   12/13/24 0955 12/13/24 1000  BP:  (!) 151/115  Pulse:    Resp:  (!) 26  Temp:  37.1 C  SpO2: (!) 81% 91%    Complications: No apparent anesthesia complications

## 2024-12-13 NOTE — Anesthesia Preprocedure Evaluation (Addendum)
"                                    Anesthesia Evaluation  Patient identified by MRN, date of birth, ID band Patient awake    Reviewed: Allergy & Precautions, NPO status , Patient's Chart, lab work & pertinent test results  Airway Mallampati: II  TM Distance: >3 FB Neck ROM: Full    Dental  (+) Missing, Edentulous Upper   Pulmonary asthma , COPD,  COPD inhaler, former smoker   Pulmonary exam normal        Cardiovascular hypertension, Pt. on home beta blockers Normal cardiovascular exam     Neuro/Psych  Headaches PSYCHIATRIC DISORDERS Anxiety      Neuromuscular disease    GI/Hepatic Neg liver ROS, PUD,,,  Endo/Other  negative endocrine ROS    Renal/GU negative Renal ROS     Musculoskeletal   Abdominal   Peds  Hematology negative hematology ROS (+)   Anesthesia Other Findings RIGHT HIP FRACTURE  Reproductive/Obstetrics                              Anesthesia Physical Anesthesia Plan  ASA: 3  Anesthesia Plan: General   Post-op Pain Management:    Induction: Intravenous  PONV Risk Score and Plan: 3 and Ondansetron , Dexamethasone  and Treatment may vary due to age or medical condition  Airway Management Planned: Oral ETT  Additional Equipment:   Intra-op Plan:   Post-operative Plan: Extubation in OR  Informed Consent: I have reviewed the patients History and Physical, chart, labs and discussed the procedure including the risks, benefits and alternatives for the proposed anesthesia with the patient or authorized representative who has indicated his/her understanding and acceptance.     Dental advisory given  Plan Discussed with: CRNA  Anesthesia Plan Comments:         Anesthesia Quick Evaluation  "

## 2024-12-13 NOTE — Op Note (Signed)
 "  ARTHROPLASTY, HIP, TOTAL, ANTERIOR APPROACH  Procedure Note Casey Schmidt   [id number]  Pre-op Diagnosis: Subacute right femoral neck fracture     Post-op Diagnosis: same  Operative Findings Subacute femoral neck fracture with extension to the lesser trochanter.  Early healing and remodeling of the calcar and lesser trochanter fracture.  Intact greater trochanter. Osteoporosis Mild hip flexion contracture due being in flexed position for 3 weeks due to pain. Prophylactic cabling of femoral shaft   Operative Procedures  Right total hip arthroplasty, cemented.  CPT 27130 Prophylactic cabling of right femur  Surgeon: Kay Cummins, M.D.  Assist: None   Anesthesia: general  Implants: Depuy Acetabulum: Emphaysis 50 mm Femur: Summit cemented size 7 standard offset Head: 36 mm size: +1.5 Liner: +4 neutral Bearing Type: metal/poly  Indications for procedure: Casey Schmidt is a 69 year old female who suffered a mechanical fall at home 3 weeks ago.  EMS was initially to the house but she declined transfer to the ED for evaluation.  Due to ongoing and worsening pain and inability to ambulate, she presented to the ED last night and work up found a right femoral neck fracture.    Total Hip Arthroplasty (Anterior Approach) Op Note:  After informed consent was obtained and the operative extremity marked in the holding area, the patient was brought back to the operating room and placed supine on the HANA table. She had a mild hip flexion contracture due to keeping her hip flexed for 3 weeks.  I pulled traction on the HANA table in order to better assess fracture morphology as the initial imaging was inconclusive.  The fluoro shots showed that it was a transcervical femoral neck fracture.  Next, the operative extremity was prepped and draped in normal sterile fashion. Surgical timeout occurred verifying patient identification, surgical site, surgical procedure and administration of antibiotics.   A 10 cm longitudinal incision was made starting from 2 fingerbreadths lateral and inferior to the ASIS towards the lateral aspect of the patella.  A Hueter approach to the hip was performed, using the interval between tensor fascia lata and sartorius.  Dissection was carried bluntly down onto the anterior hip capsule. The capsule was scarred and adherent to the femoral head and neck.  The lateral femoral circumflex vessels were identified and coagulated. A capsulotomy was performed and the capsular flaps tagged for later repair.  Oscillating saw was used for a freshen up cut.  The remnants of the femoral neck were removed.  The femoral head was removed which showed grade 4 changes, the acetabular rim was cleared of soft tissue.   Exposure was still limited at this point due to the chronicity of the injury therefore, I performed soft tissue releases around the proximal femur.  Pubofemoral ligaments were released.  Superior capsule released from the shoulder of the greater trochanter.  Piriformis and short external rotators had to be released to allow adequate mobilization of the femur.  The leg was brought back up to parallel.  These releases allowed the proximal femur to be pulled distally and posteriorly during acetabular preparation.   Sequential reaming was performed under fluoroscopic guidance down to the floor of the cotyloid fossa. We reamed to a size 49 mm, and then impacted the acetabular shell.  Bone quality was poor.  A 25 mm cancellous screw was placed to provide additional fixation of the shell.  A +4 neutral liner was then placed after irrigation and attention turned to the femur.  After placing the femoral  hook, the leg was taken to externally rotated, extended and adducted position.  I carefully inspected the integrity of the proximal femur.  The greater trochanter was intact.  The lesser trochanter demonstrated evidence of fracture remodeling.  Overall, the calcar was fractured down to the lesser  but I could not see an obvious fracture line.  Given the osteoporosis and fracture findings, I decided to place a prophylactic cable just distal to the lesser trochanter.  This was tensioned down and confirmed under fluoro.  I then turned my attention to femoral preparation.  Lateral bone from the shoulder was rasped away for relief.  Sequential broaching performed up to a size 7.  Standard trial neck and +1.5 head were placed. The leg was brought back up to neutral and the construct reduced.  It was hard to reduce given muscle tension.  The position and sizing of components, offset and leg lengths were checked using fluoroscopy. Stability of the construct was checked in 45 degrees of hip extension and 90 degrees of external rotation without any subluxation, shuck or impingement of prosthesis. We dislocated the prosthesis, dropped the leg back into position, removed trial components, and irrigated copiously.  A cement restrictor was placed down the canal and the canal was dried.  The cement was injected down the canal and pressurized with the stem.  Once the cement hardened, I trialed the +1.5 head ball again which was I was happy with and chose to go with it. The leg was brought back up, the construct reduced and fluoroscopy used to verify positioning.  Antibiotic irrigation was placed in the surgical wound.   We irrigated, obtained hemostasis and closed the capsule using #2 ethibond suture.    One gram of vancomycin  powder was placed in the surgical bed.   One gram of topical tranexamic acid  was injected into the joint.  The fascia was closed with #1 stratafix, the deep fat layer was closed with 0 vicryl, the subcutaneous layers closed with 2.0 Vicryl Plus and the skin closed with staples. A sterile dressing was applied. The patient was awakened in the operating room and taken to recovery in stable condition.  All sponge, needle, and instrument counts were correct at the end of the case.   Position: supine   Complications: see description of procedure.  Time Out: performed   Drains/Packing: none  Estimated blood loss: see anesthesia record  Returned to Recovery Room: in good condition.   Antibiotics: yes   Mechanical VTE (DVT) Prophylaxis: sequential compression devices, TED thigh-high  Chemical VTE (DVT) Prophylaxis: lovenox  POD 1   Fluid Replacement: see anesthesia record  Specimens Removed: 1 to pathology   Sponge and Instrument Count Correct? yes   PACU: portable radiograph - low AP   Plan/RTC: Return in 2 weeks for suture removal. Weight Bearing/Load Lower Extremity: full  Hip precautions: none Suture Removal: 2 weeks   N. Ozell Cummins, MD [facility] 12:42 PM   Implant Name Type Inv. Item Serial No. Manufacturer Lot No. LRB No. Used Action  CUP ACETAB EMPH CMTLS 50 3H - [id number] Cup CUP ACETAB EMPH CMTLS 50 3H  [employer]PU[employer] [id number] Right 1 Implanted  LINER ACET EMPH NTRL 36X50 +4 - [id number] Liner LINER ACET EMPH NTRL 36X50 +4  DEPU[employer] [id number] Right 1 Implanted  SCREW 6.5MMX25MM - [id number] Screw SCREW 6.5MMX25MM  DEPUY ORTH[employer] [id number] Right 1 Implanted  SLEEVE CABLE VT - [id number] Orthopedic Implant SLEEVE CABLE VT  [employer] [id number] Right 1 Implanted  CEMENT BONE DEPUY - [id number] Cement CEMENT BONE DEPUY  DEPU[employer] ORTH[employer] [id number] Right 1 Implanted  CEMENT BONE DEPUY - [id number] Cement CEMENT BONE DEPUY  DEPUY ORTHOPA[employer] [id number] Right 1 Implanted  CEMENT RESTRICTOR BONE PREP ST - [id number] Cement CEMENT RESTRICTOR BONE PREP ST  STRYKER INSTRUMENTS [id number] Right 1 Implanted  CENTRALIZER STEM HIP - [id number] Hips CENTRALIZER STEM HIP  DEPUY ORTHOPA[employer]ICS [id number] Right 1 Implanted  SUMMIT SUMMIT FEMORAL STEM 12/14 TAPER CEMENTED SIZE 7 STD - [id number]    DEPU[employer]TH[employer] [id number] Right 1 Implanted  HEAD M SROM PLUS 1.5 - [id number] Hips HEAD M SROM PLUS 1.5  DEPU[employer]TH[employer]  [id number] Right 1 Implanted   "

## 2024-12-13 NOTE — ED Provider Notes (Incomplete)
 " [place] EMERGENCY DEPARTMENT AT [facility] Provider Note   CSN: [id number] Arrival date & time: 12/12/24  2100     History Chief Complaint  Patient presents with   Fall    HPI: Casey Schmidt is a 69 y.o. female with history pertinent for COPD not on home oxygen, anxiety, degenerative disc disease who presents complaining of fall. Patient arrived via EMS from home.  History provided by patient and EMS.  No interpreter required during this encounter.  Per EMS, patient recently had a fall approximately 3 weeks ago.  Called EMS out at that time to help her up off the floor, however patient declined transport at that time.  Reportedly patient has had progressive pain and difficulty with ambulation since the first fall, has been utilizing a walker, however now pain is too severe for even use of the walker, and patient is unable to get to the bathroom medical time.  Reports that patient was hemodynamically stable and route, was initially mildly hypoxic, has a history of COPD not on home oxygen, was started on 2 L nasal cannula with improvement.  Patient did receive 200 mcg of fentanyl  for pain and route.  Patient reports that she had an initial fall approximately 3 weeks ago after her puppy got caught under my feet, and reports that she did have a second fall a couple days later due to pain in her right lower extremity.  Reports that pain has been progressive, it is primarily in her right lower extremity radiating from her right anterior thigh down to her ankle.  Denies any use of anticoagulants, denies any chest pain, shortness of breath, denies hitting her head, loss of consciousness during either of the falls.  Patient's recorded medical, surgical, social, medication list and allergies were reviewed in the Snapshot window as part of the initial history.   Prior to Admission medications  Medication Sig Start Date End Date Taking? Authorizing Provider  albuterol  (PROAIR  HFA)  108 (90 Base) MCG/ACT inhaler Inhale 2 puffs into the lungs every 4 (four) hours as needed for wheezing or shortness of breath. 07/25/16   Paz, Jose E, MD  ALPRAZolam  (XANAX ) 0.5 MG tablet Take 1 tablet (0.5 mg total) by mouth 4 (four) times daily as needed for anxiety. 10/31/16   Amon Aloysius BRAVO, MD  ALPRAZolam  (XANAX ) 0.5 MG tablet  05/02/17   [provider]  amphetamine -dextroamphetamine  (ADDERALL  XR) 20 MG 24 hr capsule Take 20 mg by mouth 2 (two) times daily.    [provider]  cetirizine (ZYRTEC) 5 MG chewable tablet Chew 5 mg by mouth daily.    [provider]  Cholecalciferol (VITAMIN D  PO) Take 1 tablet by mouth daily.    [provider]  cyclobenzaprine  (FLEXERIL ) 10 MG tablet Take 1 tablet (10 mg total) by mouth 2 (two) times daily as needed for muscle spasms. Patient not taking: Reported on 03/30/2023 09/14/17   Vicky Charleston, PA-C  Fluticasone -Salmeterol (ADVAIR) 250-50 MCG/DOSE AEPB Inhale 1 puff into the lungs 2 (two) times daily. 08/16/16   Saguier, [place], PA-C  methadone (DOLOPHINE) 10 MG/5ML solution Take 55 mg by mouth daily.    [provider]  metoprolol  succinate (TOPROL -XL) 50 MG 24 hr tablet Take 100 mg by mouth daily. 04/14/23   [provider]  Multiple Vitamin (MULTIVITAMIN) tablet Take 1 tablet by mouth daily.    [provider]  omeprazole  (PRILOSEC) 40 MG capsule Take 1 capsule (40 mg total) by mouth 2 (  two) times daily. 03/30/23   Beather Delon Gibson, PA  OVER THE COUNTER MEDICATION Vitamin D  3. Once daily.    [provider]  ranitidine  (ZANTAC ) 300 MG tablet Take 1 tablet (300 mg total) by mouth at bedtime. Patient not taking: Reported on 05/15/2023 07/04/16   Paz, Jose E, MD  SYMBICORT  160-4.5 MCG/ACT inhaler Inhale 2 puffs into the lungs 2 (two) times daily. 07/27/17   [provider]     Allergies: Amoxicillin , Aspirin, Clarithromycin, Codeine, Erythromycin, Nsaids, Tolmetin, and Tramadol    Review of Systems   ROS as per HPI  Physical Exam Updated Vital Signs BP (!) 151/87 (BP Location: Right Arm)   Pulse 85   Temp 98.6 F (37 C) (Oral)   Resp 16   SpO2 90%  Physical Exam Vitals and nursing note reviewed.  Constitutional:      General: She is not in acute distress.    Appearance: She is well-developed.  HENT:     Head: Normocephalic and atraumatic.  Eyes:     Conjunctiva/sclera: Conjunctivae normal.  Cardiovascular:     Rate and Rhythm: Normal rate and regular rhythm.     Heart sounds: No murmur heard. Pulmonary:     Effort: Pulmonary effort is normal. No respiratory distress.     Breath sounds: Normal breath sounds.  Abdominal:     Palpations: Abdomen is soft.     Tenderness: There is no abdominal tenderness.  Musculoskeletal:        General: No swelling.     Cervical back: Neck supple.     Comments: Diffuse tenderness to palpation of lower extremity with bony point tenderness, gross deformity.  Skin:    General: Skin is warm and dry.     Capillary Refill: Capillary refill takes less than 2 seconds.  Neurological:     Mental Status: She is alert.  Psychiatric:        Mood and Affect: Mood normal.     ED Course/ Medical Decision Making/ A&P    Procedures Procedures   Medications Ordered in ED Medications - No data to display  Medical Decision Making:   Casey Schmidt is a 69 y.o. female who presents for recent fall with persistent right lower extremity pain as per above.  Physical exam is pertinent for tenderness to right lower extremity without gross deformity.   The differential includes but is not limited to , ICH, TBI, skull fracture, spinal fracture/dislocation, blunt thoracic trauma, hemothorax, pneumothorax, rib fractures, blunt abdominal trauma, hemorrhage, extremity fracture, dislocation.  Independent historian: EMS  External data reviewed: Labs: reviewed prior labs for baseline  Initial Plan:  VBG to evaluate for  hypercarbic respiratory failure Screening labs including CBC and Metabolic panel to evaluate for infectious or metabolic etiology of disease.  Screening Objective evaluation as below reviewed   Labs: {LSLABS:33416} CBC: *** CMP: *** Coags: *** LA: *** Ethanol: *** UA: ***  Radiology: {LSRADS:33417} CT head: ***No ICH or displaced skull fracture CT C-spine: ***No displaced fracture or dislocation CT face: ***No displaced fracture CTA neck: ***No large vessel cutoff CT T/L-spine: ***No displaced fracture or dislocation CT CAP: ***No pneumothorax, hemothorax, intra-abdominal free air, significant intra-abdominal free fluid, overt bony displacement CXR: ***No acute cardiac or pulmonary abnormality. ***No appreciable rib fx. ***No PTX.  Pelvis XR: ***Pelvic ring intact. No acute fx. ***Both hips located.  Extremity XR's: *** DG Foot 2 Views Right Result Date: 12/12/2024 EXAM: 1 OR 2 VIEW(S) XRAY OF THE FOOT 12/12/2024 10:10:00 PM  COMPARISON: 12/07/2015 CLINICAL HISTORY: Fall, pain. FINDINGS: BONES AND JOINTS: Screw fixation of the calcaneus in place. Degenerative changes of the first MTP joint. Diffuse bone demineralization. No acute fracture. No malalignment. SOFT TISSUES: Soft tissue swelling of the forefoot. IMPRESSION: 1. No acute fracture or dislocation. 2. Soft tissue swelling of the forefoot. 3. Screw fixation of the calcaneus is present. 4. Diffuse bone demineralization. Electronically signed by: Elsie Gravely MD 12/12/2024 10:20 PM EST RP Workstation: [id number]   DG Ankle Complete Right Result Date: 12/12/2024 EXAM: 3 OR MORE VIEW(S) XRAY OF THE RIGHT ANKLE 12/12/2024 10:10:00 PM CLINICAL HISTORY: Fall, pain. COMPARISON: Right foot 12/07/2015. FINDINGS: BONES AND JOINTS: Calcaneal screw in place. First MTP degenerative changes. Degenerative changes in the intertarsal and subtalar joints. Diffuse bone demineralization. No acute fracture. No malalignment. SOFT TISSUES: Soft tissue  swelling at the lateral malleolus. IMPRESSION: 1. No acute fracture or dislocation. 2. Soft tissue swelling at the lateral malleolus. 3. Calcaneal screw in place. Electronically signed by: Elsie Gravely MD 12/12/2024 10:19 PM EST RP Workstation: [id number]   DG Tibia/Fibula Right Result Date: 12/12/2024 EXAM: 2 VIEW(S) XRAY OF THE RIGHT TIBIA AND FIBULA 12/12/2024 10:10:00 PM COMPARISON: Right knee 09/14/2017. CLINICAL HISTORY: Fall, pain. FINDINGS: BONES AND JOINTS: Surgical screw in talus noted. No acute fracture. No malalignment. SOFT TISSUES: Unremarkable. IMPRESSION: 1. No acute fracture or dislocation. Electronically signed by: Elsie Gravely MD 12/12/2024 10:17 PM EST RP Workstation: [id number]   DG Femur Min 2 Views Right Result Date: 12/12/2024 EXAM: 2 VIEW(S) XRAY OF THE RIGHT FEMUR 12/12/2024 10:10:00 PM COMPARISON: Right knee 09/14/2017. CLINICAL HISTORY: Fall, pain. FINDINGS: BONES AND JOINTS: Possible acute impacted fracture of the right femoral head and neck with varus angulation. Degenerative changes of both hips. SOFT TISSUES: Unremarkable. LIMITATIONS/ARTIFACTS: Visualization is limited due to crosstable technique. RECOMMENDATIONS: Dedicated views of the right hip are recommended for better visualization. IMPRESSION: 1. Possible acute impacted fracture of the right femoral head and neck with varus angulation. Visualization is limited due to crosstable technique. Dedicated views of the right hip are recommended for better visualization. Electronically signed by: Elsie Gravely MD 12/12/2024 10:17 PM EST RP Workstation: [id number]    EKG/Medicine tests: {ODZXH:66585} EKG Interpretation:    Decision rules:  {Document cardiac monitor, telemetry assessment procedure when appropriate:1} {   Click here for ABCD2, HEART and other calculatorsREFRESH Note before signing :1}   {Document critical care time when appropriate:1} {Document review of labs and clinical decision tools ie heart  score, Chads2Vasc2 etc:1}  {Document your independent review of radiology images, and any outside records:1} {Document your discussion with family members, caretakers, and with consultants:1} {Document social determinants of health affecting pt's care:1} {Document your decision making why or why not admission, treatments were needed:1}                Interventions:***   See the EMR for full details regarding lab and imaging results.  {LSTRAUMA:33429}  Upon arrival, the patient was transferred over to the trauma bed. ***ABCs intact as exam above. Once 2 IVs were confirmed, the secondary exam was performed, and findings are noted above. Pertinent physical exam findings include ***. Portable XRs performed at the bedside. eFAST exam ***not performed. The patient was then prepared and sent to the CT for full trauma scans.   Currently, patient is*** awake, alert, and protecting ***own airway and is ***hemodynamically stable.  {LSCOPA:33420}  Discussion of management or test interpretations with external provider(s): ***  Risk Drugs:{LSDRUGS:33399} Treatment: {LSTREATMENT:33409} Surgery:{LSSURGERY:33410} Critical Care: ***  Disposition: {LSDISPO:33388}  MDM generated using voice dictation software and may contain dictation errors.  Please contact me for any clarification or with any questions.   Clinical Impression: No diagnosis found.   Data Unavailable   Final Clinical Impression(s) / ED Diagnoses Final diagnoses:  None    Rx / DC Orders ED Discharge Orders     None      "

## 2024-12-14 DIAGNOSIS — S72001A Fracture of unspecified part of neck of right femur, initial encounter for closed fracture: Secondary | ICD-10-CM | POA: Diagnosis not present

## 2024-12-14 LAB — CBC
HCT: 30.8 % — ABNORMAL LOW (ref 36.0–46.0)
Hemoglobin: 9.3 g/dL — ABNORMAL LOW (ref 12.0–15.0)
MCH: 27.8 pg (ref 26.0–34.0)
MCHC: 30.2 g/dL (ref 30.0–36.0)
MCV: 91.9 fL (ref 80.0–100.0)
Platelets: 404 10*3/uL — ABNORMAL HIGH (ref 150–400)
RBC: 3.35 MIL/uL — ABNORMAL LOW (ref 3.87–5.11)
RDW: 16.3 % — ABNORMAL HIGH (ref 11.5–15.5)
WBC: 12 10*3/uL — ABNORMAL HIGH (ref 4.0–10.5)
nRBC: 0 % (ref 0.0–0.2)

## 2024-12-14 LAB — BASIC METABOLIC PANEL WITH GFR
Anion gap: 6 (ref 5–15)
BUN: 17 mg/dL (ref 8–23)
CO2: 27 mmol/L (ref 22–32)
Calcium: 8.1 mg/dL — ABNORMAL LOW (ref 8.9–10.3)
Chloride: 100 mmol/L (ref 98–111)
Creatinine, Ser: 0.83 mg/dL (ref 0.44–1.00)
GFR, Estimated: >60 mL/min
Glucose, Bld: 117 mg/dL — ABNORMAL HIGH (ref 70–99)
Potassium: 3.9 mmol/L (ref 3.5–5.1)
Sodium: 134 mmol/L — ABNORMAL LOW (ref 135–145)

## 2024-12-14 LAB — MAGNESIUM: Magnesium: 2.1 mg/dL (ref 1.7–2.4)

## 2024-12-14 NOTE — Plan of Care (Signed)

## 2024-12-14 NOTE — Progress Notes (Signed)
" °  Progress Note   Patient: Casey Schmidt FMW:[id number] DOB: May 09, 1956 DOA: 12/12/2024     1 DOS: the patient was seen and examined on 12/14/2024    Brief hospital course: Casey Schmidt is a 69 y.o. female with COPD, hypertension, ADHD who presented to the ER with persistent right hip pain after a fall at home about 2 weeks ago.  The patient was found to have a right hip fracture.  Orthopedic surgery was consulted and patient underwent right total hip arthroplasty on 1/24.  Assessment and Plan:  Right hip fracture Due to mechanical fall. Orthopedic surgery was consulted. Patient underwent right total hip arthroplasty on 1/24. - Continue pain management. - DVT prophylaxis with SQ Lovenox  - PT/OT  Left elbow cellulitis - Continue doxycycline .  Chronic T1 endplate fracture Noted on trauma imaging. - Continue supportive care.  COPD On albuterol  inhaler at home. Patient reportedly no longer taking Symbicort  or montelukast. -Continue PRN Albuterol   Hypoxia Currently requiring 3 L/min supplemental oxygen. No baseline home oxygen. -Continue supplemental oxygen, and wean off as tolerated.  Hypokalemia Presented with potassium of 3.1. - Will continue to replete as needed.  Hypertension -Continue metoprolol   ADHD On Adderall  at home. Adderall  not on hospital formulary. - Will hold until discharge.  Underweight Body mass index is 17.18 kg/m. - Nutritional supplementation with Ensure       Subjective: Patient complains of some pain  Physical Exam: BP (!) 144/73 (BP Location: Right Arm)   Pulse 83   Temp 97.6 F (36.4 C) (Oral)   Resp 20   Ht 5' 3 (1.6 m)   Wt 44 kg   SpO2 95%   BMI 17.18 kg/m    General: Alert, oriented X3  Eyes: Pupils equal, reactive  Oral cavity: moist mucous membranes  Head: Atraumatic, normocephalic  Neck: supple  Chest: clear to auscultation. No crackles, no wheezes  CVS: S1,S2 RRR. No murmurs  Abd: No distention, soft,  non-tender. No masses palpable  Extr: No edema   MSK: No joint deformities or swelling  Neurological: Grossly intact.    Data Reviewed:    Latest Ref Rng & Units 12/14/2024    3:40 AM 12/13/2024    3:18 PM 12/13/2024    4:45 AM  CBC  WBC 4.0 - 10.5 K/uL 12.0  20.7  14.3   Hemoglobin 12.0 - 15.0 g/dL 9.3  89.8  88.8   Hematocrit 36.0 - 46.0 % 30.8  31.2  36.3   Platelets 150 - 400 K/uL 404  504  402       Latest Ref Rng & Units 12/14/2024    3:40 AM 12/13/2024    3:18 PM 12/13/2024    4:45 AM  BMP  Glucose 70 - 99 mg/dL 882   99   BUN 8 - 23 mg/dL 17   23   Creatinine 9.55 - 1.00 mg/dL 9.16  9.23  9.30   Sodium 135 - 145 mmol/L 134   136   Potassium 3.5 - 5.1 mmol/L 3.9   3.1   Chloride 98 - 111 mmol/L 100   98   CO2 22 - 32 mmol/L 27   27   Calcium  8.9 - 10.3 mg/dL 8.1   8.3      Family Communication: n/a  Disposition: Status is: Inpatient  DVT PPx: SQ lovenox       Author: MDALA-GAUSI, GOLDEN PILLOW, MD 12/14/2024 1:20 PM  For on call review [email or web address].    "

## 2024-12-14 NOTE — Evaluation (Signed)
 Physical Therapy Evaluation Patient Details Name: Casey Schmidt MRN: [id number] DOB: 04/14/1956 Today's Date: 12/14/2024  History of Present Illness  69 yo female, presented to ED with persistent R hip pain after fall 2-3 weeks ago. Found to have R hip fracture and underwent R THA anterior approach on 12/13/2024 by Dr. Jerri. PMH: calcaneal fracture ORIF R 12/2015, L shoulder surgery, R wrist fx with sx 11/2013.  Clinical Impression  Pt stating she was nauseous through the session. And very anxious for any movement. Spent a lot of time educating and encouraging patient to move a little and with help. Noticed she likely has a R LE flexion contracture over the last few weeks, very guarded and would not extend -30-40 degrees from extension. Also holds R knee in flexion and abduction with IR. Really educated to try to relax in neutral and after session did my best to float R heel and position in best extension as possible with pillows. Placed Allevyn on R heel, pt stated it was sore from pressure and also has hardened callus at this location due to previous calcaneal surgery at this site. Alerted nursing to keep eye on R heel.  Stood 2 times at bedside, and she refused to get to recliner today. Will continue to work with pt while here in acute. Patient will benefit from continued inpatient follow up therapy, <3 hours/day       If plan is discharge home, recommend the following: A lot of help with walking and/or transfers;A lot of help with bathing/dressing/bathroom;Help with stairs or ramp for entrance;Assist for transportation;Assistance with cooking/housework   Can travel by private vehicle        Equipment Recommendations None recommended by PT  Recommendations for Other Services       Functional Status Assessment Patient has had a recent decline in their functional status and demonstrates the ability to make significant improvements in function in a reasonable and predictable amount of time.      Precautions / Restrictions Precautions Precautions: None (no hip precautions per MD note and orders) Restrictions RLE Weight Bearing Per Provider Order: Weight bearing as tolerated      Mobility  Bed Mobility Overal bed mobility: Needs Assistance Bed Mobility: Supine to Sit, Sit to Supine     Supine to sit: Max assist Sit to supine: Max assist   General bed mobility comments: broke down movment into multiple small steps and coached each step. Assisted with RLE and upper body the entire time. using pad under patient to help assist and position each step .    Transfers Overall transfer level: Needs assistance Equipment used: Rolling walker (2 wheels) Transfers: Sit to/from Stand Sit to Stand: Mod assist           General transfer comment: performed 2 x with static stand each time for about 30 second interval. On LLE and RLE still in flexed position and not much weight. Pt refused transfer to recliner today, so standing was an accomplishment for her today. She is very anxious and realizes it is not as bad as she thought, but most concerned about R knee flexion contracture for progression    Ambulation/Gait Ambulation/Gait assistance:  (unable to today)                Stairs            Wheelchair Mobility     Tilt Bed    Modified Rankin (Stroke Patients Only)       Balance  Pertinent Vitals/Pain Pain Assessment Pain Assessment: Faces Faces Pain Scale: Hurts whole lot Pain Location: pt reports a lot of pain with very little movment and even in rest. Positioned very guarded with R knee flexed and ER , and LLE flexed as well. Pain Descriptors / Indicators: Sharp, Sore Pain Intervention(s): Limited activity within patient's tolerance, Monitored during session, Repositioned, Premedicated before session (pt refused ice)    Home Living Family/patient expects to be discharged to:: Private  residence Living Arrangements: Alone (pt reports son can check on her prior to work in am and a neighbor in the pm) Available Help at Discharge: Available PRN/intermittently;Family Type of Home: Apartment Home Access: Stairs to enter Entrance Stairs-Rails: None Entrance Stairs-Number of Steps: 1 curb   Home Layout: One level Home Equipment: [employer] (2 wheels) Additional Comments: may need to find out more about home living, pt was very anxious wth many questions on eval    Prior Function Prior Level of Function : Independent/Modified Independent             Mobility Comments: pt was using RW for past 3 weeks due to fall and hip fracture, but prior to that patient reports independent with no AD and driving.       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 tried relaxion and gentle stretch to her tolerance but pt did not let me try to much. Also hold R LE in ER did get to neutral but pt very guarded. RLE: Unable to fully assess due to pain RLE Sensation: WNL       Communication   Communication Communication: No apparent difficulties    Cognition Arousal: Alert Behavior During Therapy: Flat affect   PT - Cognitive impairments: No family/caregiver present to determine baseline, Difficult to assess                       PT - Cognition Comments: Pt able to follow directions, but kept eyes shut at times in response to pain, so difficult to assess, but all questions answered appropriated a tthis time. Following commands: Intact       Cueing Cueing Techniques: Verbal cues, Gestural cues, Tactile cues     General Comments      Exercises     Assessment/Plan    PT Assessment Patient needs continued PT services  PT Problem List Decreased strength;Decreased range of motion;Decreased activity tolerance;Decreased  balance;Decreased mobility;Decreased coordination       PT Treatment Interventions DME instruction;Gait training;Stair training;Functional mobility training;Therapeutic activities;Therapeutic exercise;Balance training;Patient/family education    PT Goals (Current goals can be found in the Care Plan section)  Acute Rehab PT Goals Patient Stated Goal: I want to get back to my home and walk again PT Goal Formulation: With patient Time For Goal Achievement: 12/28/24 Potential to Achieve Goals: Good    Frequency Min 3X/week     Co-evaluation               AM-PAC PT 6 Clicks Mobility  Outcome Measure Help needed turning from your back to your side while in a flat bed without using bedrails?: A Lot Help needed moving from lying on your back to sitting on the side of a flat bed without using bedrails?: A Lot Help needed moving to and from a bed to a chair (including a wheelchair)?: A Lot Help needed standing  up from a chair using your arms (e.g., wheelchair or bedside chair)?: Total Help needed to walk in hospital room?: Total Help needed climbing 3-5 steps with a railing? : Total 6 Click Score: 9    End of Session Equipment Utilized During Treatment: Gait belt Activity Tolerance: Patient limited by pain;Patient limited by fatigue Patient left: in bed;with bed alarm set;with call bell/phone within reach Nurse Communication: Mobility status PT Visit Diagnosis: Other abnormalities of gait and mobility (R26.89);Muscle weakness (generalized) (M62.81)    Time: 8674-8584 PT Time Calculation (min) (ACUTE ONLY): 50 min   Charges:   PT Evaluation $PT Eval Low Complexity: 1 Low PT Treatments $Therapeutic Activity: 23-37 mins PT General Charges $$ ACUTE PT VISIT: 1 Visit         Dorean Daniello, PT, MPT Acute Rehabilitation Services Office: [phone] If a weekend: secure chat groups: [facility] PT, [facility] OT, [facility] SLP 12/14/2024   Antwonette Feliz 12/14/2024, 2:52 PM

## 2024-12-15 DIAGNOSIS — S72001A Fracture of unspecified part of neck of right femur, initial encounter for closed fracture: Secondary | ICD-10-CM | POA: Diagnosis not present

## 2024-12-15 LAB — CBC
HCT: 31.5 % — ABNORMAL LOW (ref 36.0–46.0)
Hemoglobin: 9.5 g/dL — ABNORMAL LOW (ref 12.0–15.0)
MCH: 27.2 pg (ref 26.0–34.0)
MCHC: 30.2 g/dL (ref 30.0–36.0)
MCV: 90.3 fL (ref 80.0–100.0)
Platelets: 452 10*3/uL — ABNORMAL HIGH (ref 150–400)
RBC: 3.49 MIL/uL — ABNORMAL LOW (ref 3.87–5.11)
RDW: 16.4 % — ABNORMAL HIGH (ref 11.5–15.5)
WBC: 12.6 10*3/uL — ABNORMAL HIGH (ref 4.0–10.5)
nRBC: 0 % (ref 0.0–0.2)

## 2024-12-15 LAB — MISC LABCORP TEST (SEND OUT)
LabCorp test name: 83935
Labcorp test code: 83935

## 2024-12-15 LAB — BASIC METABOLIC PANEL WITH GFR
Anion gap: 10 (ref 5–15)
BUN: 20 mg/dL (ref 8–23)
CO2: 27 mmol/L (ref 22–32)
Calcium: 8.6 mg/dL — ABNORMAL LOW (ref 8.9–10.3)
Chloride: 98 mmol/L (ref 98–111)
Creatinine, Ser: 0.72 mg/dL (ref 0.44–1.00)
GFR, Estimated: >60 mL/min
Glucose, Bld: 102 mg/dL — ABNORMAL HIGH (ref 70–99)
Potassium: 3.6 mmol/L (ref 3.5–5.1)
Sodium: 135 mmol/L (ref 135–145)

## 2024-12-15 MED ORDER — GUAIFENESIN-DM 100-10 MG/5ML PO SYRP
5.0000 mL | ORAL_SOLUTION | ORAL | Status: DC | PRN
  Administered 2024-12-15 – 2024-12-22 (×10): 5 mL via ORAL
  Filled 2024-12-15 (×11): qty 10

## 2024-12-15 MED ORDER — ACETAMINOPHEN 325 MG PO TABS
650.0000 mg | ORAL_TABLET | Freq: Four times a day (QID) | ORAL | Status: DC
  Administered 2024-12-17 – 2024-12-21 (×12): 650 mg via ORAL
  Filled 2024-12-15 (×18): qty 2

## 2024-12-15 MED ORDER — OXYCODONE HCL 5 MG PO TABS
5.0000 mg | ORAL_TABLET | ORAL | Status: DC | PRN
  Administered 2024-12-15 – 2024-12-18 (×5): 5 mg via ORAL
  Filled 2024-12-15 (×7): qty 1

## 2024-12-15 MED ORDER — OXYCODONE HCL 5 MG PO TABS
7.5000 mg | ORAL_TABLET | ORAL | Status: DC | PRN
  Administered 2024-12-15 – 2024-12-19 (×9): 7.5 mg via ORAL
  Filled 2024-12-15 (×10): qty 2

## 2024-12-15 NOTE — Progress Notes (Signed)
 Initial Nutrition Assessment  DOCUMENTATION CODES:   Underweight  INTERVENTION:  - Regular diet.  - Ensure Plus High Protein po BID, each supplement provides 350 kcal and 20 grams of protein. - Magic cup TID with meals, each supplement provides 290 kcal and 9 grams of protein - Encourage intake at all meals and of supplements. - Multivitamin with minerals daily.  - Monitor weight trends.   NUTRITION DIAGNOSIS:   Increased nutrient needs related to hip fracture as evidenced by estimated needs.  GOAL:   Patient will meet greater than or equal to 90% of their needs  MONITOR:   PO intake, Supplement acceptance, Weight trends  REASON FOR ASSESSMENT:   Consult Hip fracture protocol  ASSESSMENT:   69 y.o. female with PMH COPD, HTN, ADHD who presented with persistent right hip pain after a fall at home about 2 weeks ago. Admitted for right hip fracture.  RD working remotely. Called patient via bedside telephone to obtain nutrition history but unable to reach patient.  Per chart review of office weights, patient weighed at 83# in March 2025 and has since been gaining to current weight of 97#.   Patient documented to be consuming 0% of the documented meals (only 2 documented). She is ordering 3 meals a day. Thankfully she is accepting Ensure at least once a day.  Will continue Ensure to support oral intake and add Magic Cup.     Medications reviewed and include: Colace, MVI  Labs reviewed:  -  NUTRITION - FOCUSED PHYSICAL EXAM:  RD working remotely  Diet Order:   Diet Order             Diet regular Room service appropriate? Yes; Fluid consistency: Thin  Diet effective now                   EDUCATION NEEDS:  Not appropriate for education at this time  Skin:  Skin Assessment: Skin Integrity Issues: Skin Integrity Issues:: Incisions Incisions: Right Hip  Last BM:  1/24  Height:  Ht Readings from Last 1 Encounters:  12/13/24 5' 3 (1.6 m)   Weight:  Wt  Readings from Last 1 Encounters:  12/13/24 44 kg    BMI:  Body mass index is 17.18 kg/m.  Estimated Nutritional Needs:  Kcal:  1550-1750 kcals Protein:  65-80 grams Fluid:  >/= 1.6L    Trude Ned RD, LDN Contact via Secure Chat.

## 2024-12-15 NOTE — TOC Progression Note (Signed)
 Transition of Care [facility]) - Progression Note    Patient Details  Name: Casey Schmidt MRN: [id number] Date of Birth: 1956-10-18  Transition of [facility]) CM/SW Contact  Heather DELENA Saltness, LCSW Phone Number: 12/15/2024, 3:12 PM  Clinical Narrative:    CSW attempted to speak with pt via phone call at [phone] to discuss PT's recommendation for SNF rehab. No answer, voicemail left requesting return phone call. CSW also called room phone at [phone] [phone] with no answer. TOC will continue to follow.    Expected Discharge Plan and Services  TBD                Social Drivers of Health (SDOH) Interventions SDOH Screenings   Food Insecurity: No Food Insecurity (12/13/2024)  Housing: Low Risk (12/13/2024)  Transportation Needs: No Transportation Needs (12/13/2024)  Utilities: Not At Risk (12/13/2024)  Physical Activity: Sufficiently Active (06/16/2022)   Received from [facility] visits prior to 01/20/2023., [facility]  Social Connections: Unknown (12/13/2024)  Stress: No Stress Concern Present (06/16/2022)   Received from [facility], [facility] visits prior to 01/20/2023.  Tobacco Use: High Risk (12/13/2024)    Readmission Risk Interventions     No data to display         Signed: Heather Saltness, MSW, LCSW Clinical Social Worker Inpatient Care Management 12/15/2024 3:18 PM

## 2024-12-15 NOTE — Progress Notes (Signed)
 " Progress Note   Patient: Casey Schmidt FMW:[id number] DOB: December 01, 1955 DOA: 12/12/2024     2 DOS: the patient was seen and examined on 12/15/2024    Brief hospital course: Casey Schmidt is a 69 y.o. female with COPD, hypertension, ADHD who presented to the ER with persistent right hip pain after a fall at home about 2 weeks ago.  The patient was found to have a right hip fracture.  Orthopedic surgery was consulted and patient underwent right total hip arthroplasty on 1/24.  Assessment and Plan:  Right hip fracture Due to mechanical fall. Orthopedic surgery was consulted. Patient underwent right total hip arthroplasty on 1/24. - Continue pain management: Scheduled Tylenol , as needed oxycodone  - DVT prophylaxis with SQ Lovenox  -plan to discharge on DVT prophylaxis for 28 days - Seen by PT/OT.  SNF recommended.  Left elbow cellulitis - Continue doxycycline .  Chronic T1 endplate fracture Noted on trauma imaging. - Continue supportive care.  COPD On albuterol  inhaler at home. Patient reportedly no longer taking Symbicort  or montelukast. -Continue PRN Albuterol   Hypoxia Currently requiring 3 L/min supplemental oxygen. No baseline home oxygen. -Continue supplemental oxygen, and wean off as tolerated.  Hypokalemia Presented with potassium of 3.1. - Will continue to replete as needed.  Hypertension -Continue metoprolol   ADHD On Adderall  at home. Adderall  not on hospital formulary. - Will hold until discharge.  Underweight Body mass index is 17.18 kg/m. - Nutritional supplementation with Ensure       Subjective: Patient complains of pain.  Pain regimen adjusted.  Physical Exam: BP (!) 179/92 (BP Location: Left Arm)   Pulse 90   Temp 98 F (36.7 C) (Oral)   Resp 16   Ht 5' 3 (1.6 m)   Wt 44 kg   SpO2 96%   BMI 17.18 kg/m    General: Alert, oriented X3  Eyes: Pupils equal, reactive  Oral cavity: moist mucous membranes  Head: Atraumatic,  normocephalic  Neck: supple  Chest: clear to auscultation. No crackles, no wheezes  CVS: S1,S2 RRR. No murmurs  Abd: No distention, soft, non-tender. No masses palpable  Extr: No edema   MSK: No joint deformities or swelling  Neurological: Grossly intact.    Data Reviewed:    Latest Ref Rng & Units 12/15/2024    3:09 AM 12/14/2024    3:40 AM 12/13/2024    3:18 PM  CBC  WBC 4.0 - 10.5 K/uL 12.6  12.0  20.7   Hemoglobin 12.0 - 15.0 g/dL 9.5  9.3  89.8   Hematocrit 36.0 - 46.0 % 31.5  30.8  31.2   Platelets 150 - 400 K/uL 452  404  504       Latest Ref Rng & Units 12/15/2024    3:09 AM 12/14/2024    3:40 AM 12/13/2024    3:18 PM  BMP  Glucose 70 - 99 mg/dL 897  882    BUN 8 - 23 mg/dL 20  17    Creatinine 9.55 - 1.00 mg/dL 9.27  9.16  9.23   Sodium 135 - 145 mmol/L 135  134    Potassium 3.5 - 5.1 mmol/L 3.6  3.9    Chloride 98 - 111 mmol/L 98  100    CO2 22 - 32 mmol/L 27  27    Calcium  8.9 - 10.3 mg/dL 8.6  8.1       Family Communication: n/a  Disposition: Status is: Inpatient  DVT PPx: SQ lovenox   Author: MDALA-GAUSI, Benaiah Behan AGATHA, MD 12/15/2024 12:42 PM  For on call review [email or web address].    "

## 2024-12-15 NOTE — Plan of Care (Signed)
" °  Problem: Education: Goal: Knowledge of General Education information will improve Description: Including pain rating scale, medication(s)/side effects and non-pharmacologic comfort measures Outcome: Progressing   Problem: Health Behavior/Discharge Planning: Goal: Ability to manage health-related needs will improve Outcome: Progressing   Problem: Clinical Measurements: Goal: Ability to maintain clinical measurements within normal limits will improve Outcome: Progressing Goal: Will remain free from infection Outcome: Progressing Goal: Diagnostic test results will improve Outcome: Progressing Goal: Respiratory complications will improve Outcome: Progressing Goal: Cardiovascular complication will be avoided Outcome: Progressing   Problem: Activity: Goal: Risk for activity intolerance will decrease Outcome: Progressing   Problem: Nutrition: Goal: Adequate nutrition will be maintained Outcome: Progressing   Problem: Coping: Goal: Level of anxiety will decrease Outcome: Progressing   Problem: Elimination: Goal: Will not experience complications related to bowel motility Outcome: Progressing Goal: Will not experience complications related to urinary retention Outcome: Progressing   Problem: Pain Managment: Goal: General experience of comfort will improve and/or be controlled Outcome: Progressing   Problem: Safety: Goal: Ability to remain free from injury will improve Outcome: Progressing   Problem: Skin Integrity: Goal: Risk for impaired skin integrity will decrease Outcome: Progressing   Problem: Education: Goal: Verbalization of understanding the information provided (i.e., activity precautions, restrictions, etc) will improve Outcome: Progressing   Problem: Activity: Goal: Ability to ambulate and perform ADLs will improve Outcome: Progressing   Problem: Clinical Measurements: Goal: Postoperative complications will be avoided or minimized Outcome:  Progressing   Problem: Self-Concept: Goal: Ability to maintain and perform role responsibilities to the fullest extent possible will improve Outcome: Progressing   Problem: Pain Management: Goal: Pain level will decrease Outcome: Progressing   Problem: Education: Goal: Knowledge of the prescribed therapeutic regimen will improve Outcome: Progressing   Problem: Bowel/Gastric: Goal: Gastrointestinal status for postoperative course will improve Outcome: Progressing   Problem: Cardiac: Goal: Ability to maintain an adequate cardiac output Outcome: Progressing Goal: Will show no evidence of cardiac arrhythmias Outcome: Progressing   Problem: Nutritional: Goal: Will attain and maintain optimal nutritional status Outcome: Progressing   Problem: Neurological: Goal: Will regain or maintain usual level of consciousness Outcome: Progressing   Problem: Clinical Measurements: Goal: Ability to maintain clinical measurements within normal limits Outcome: Progressing Goal: Postoperative complications will be avoided or minimized Outcome: Progressing   Problem: Respiratory: Goal: Will regain and/or maintain adequate ventilation Outcome: Progressing Goal: Respiratory status will improve Outcome: Progressing   Problem: Skin Integrity: Goal: Demonstrates signs of wound healing without infection Outcome: Progressing   Problem: Urinary Elimination: Goal: Will remain free from infection Outcome: Progressing Goal: Ability to achieve and maintain adequate urine output Outcome: Progressing   "

## 2024-12-15 NOTE — Discharge Instructions (Signed)

## 2024-12-15 NOTE — Evaluation (Signed)
 Occupational Therapy Evaluation Patient Details Name: Casey Schmidt MRN: [id number] DOB: [birth year] Today's Date: 12/15/2024   History of Present Illness   69 yo female, presented to ED with persistent R hip pain after fall 2-3 weeks ago. Found to have R hip fracture and underwent R THA anterior approach on 12/13/2024 by Dr. Jerri. PMH: calcaneal fracture ORIF R 12/2015, L shoulder surgery, R wrist fx with sx 11/2013.     Clinical Impressions Patient is s/p R THA surgery resulting in functional limitations due to the deficits listed below (see OT Problem List). Prior to admit, pt was living alone and completing all ADL's and functional mobility independently/Mod I level. Patient will benefit from acute skilled OT to increase their safety and independence with ADL and functional mobility for ADL to allow facilitate discharge. Patient will benefit from continued inpatient follow up therapy, <3 hours/day.       If plan is discharge home, recommend the following:   A lot of help with walking and/or transfers;A lot of help with bathing/dressing/bathroom;Assistance with cooking/housework;Assist for transportation     Functional Status Assessment   Patient has had a recent decline in their functional status and demonstrates the ability to make significant improvements in function in a reasonable and predictable amount of time.     Equipment Recommendations   BSC/3in1;Tub/shower seat;Other (comment) (deferr to next venue of care)      Precautions/Restrictions   Precautions Precautions: None Recall of Precautions/Restrictions: Intact Restrictions Weight Bearing Restrictions Per Provider Order: No RLE Weight Bearing Per Provider Order: Weight bearing as tolerated     Mobility Bed Mobility Overal bed mobility: Needs Assistance Bed Mobility: Supine to Sit     Supine to sit: Mod assist, HOB elevated, Used rails     General bed mobility comments: Provided VC for technique  and sequencing. Bed pad was used to completely pivot legs and hips to allow pt to sit EOB. Assist provided for BLE both off and onto bed. Pt was able to manage UB when transitioning back into bed.    Transfers Overall transfer level: Needs assistance Equipment used: Rolling walker (2 wheels) Transfers: Bed to chair/wheelchair/BSC, Sit to/from Stand Sit to Stand: Mod assist, From elevated surface     Step pivot transfers: Contact guard assist     General transfer comment: Increased time to initiate task. VC provided for hand placement during RW management prior to sit<>stand transition. Due to poor initiation, pt required Mod A to power up into standing although once standing, CGA was provided for balance.      Balance Overall balance assessment: Needs assistance Sitting-balance support: Bilateral upper extremity supported, Feet supported Sitting balance-Leahy Scale: Good Sitting balance - Comments: sitting EOB and on BSC   Standing balance support: Bilateral upper extremity supported, During functional activity, Reliant on assistive device for balance Standing balance-Leahy Scale: Poor     ADL either performed or assessed with clinical judgement   ADL       Grooming: Wash/dry hands;Set up;Sitting   Upper Body Bathing: Set up;Sitting   Lower Body Bathing: Moderate assistance;Sit to/from stand;Sitting/lateral leans   Upper Body Dressing : Set up;Sitting   Lower Body Dressing: Total assistance;Bed level   Toilet Transfer: Moderate assistance;Contact guard assist;BSC/3in1;Rolling walker (2 wheels);Stand-pivot (Mod A to power up to standing from bed. Once standing, provided CGA. CGA to perform sit to stand BSC with RW)   Toileting- Clothing Manipulation and Hygiene: Set up;Sitting/lateral lean  Vision Baseline Vision/History: 0 No visual deficits Ability to See in Adequate Light: 0 Adequate Patient Visual Report: No change from baseline Vision  Assessment?: No apparent visual deficits     Perception Perception: Not tested       Praxis Praxis: Not tested       Pertinent Vitals/Pain Pain Assessment Pain Assessment: Faces Faces Pain Scale: Hurts whole lot Pain Location: Right hip and knee with any movement Pain Descriptors / Indicators: Sharp, Sore, Grimacing, Discomfort Pain Intervention(s): Limited activity within patient's tolerance, Monitored during session, Repositioned, RN gave pain meds during session     Extremity/Trunk Assessment Upper Extremity Assessment Upper Extremity Assessment: Generalized weakness;Right hand dominant   Lower Extremity Assessment Lower Extremity Assessment: Defer to PT evaluation   Cervical / Trunk Assessment Cervical / Trunk Assessment: Kyphotic   Communication Communication Communication: No apparent difficulties   Cognition Arousal: Alert Behavior During Therapy: Flat affect Cognition: No apparent impairments      OT - Cognition Comments: Does better when she knows the plan for session and what steps she needs to do.    Following commands: Intact       Cueing  General Comments   Cueing Techniques: Verbal cues;Gestural cues  3L O2 via [place] on at start of session. Removed [place] while seated EOB, although SpO2 dropped to 87%. 3L O2 placed back on via [place] with SpO2 increasing to 90-91%.           Home Living Family/patient expects to be discharged to:: Private residence Living Arrangements: Alone Available Help at Discharge: Available PRN/intermittently;Family;Neighbor Type of Home: Other(Comment) (Condo) Home Access: Stairs to enter [employer] of Steps: 1 curb Entrance Stairs-Rails: None Home Layout: One level     Bathroom Shower/Tub: Tub/shower unit         Home Equipment: Agricultural Consultant (2 wheels)   Additional Comments: Has a [place]ie named Environmental Health Practitioner      Prior Functioning/Environment Prior Level of Function : Independent/Modified Independent     Mobility Comments: pt was using RW for past 3 weeks due to fall and hip fracture, but prior to that patient reports independent with no AD and driving.      OT Problem List: Decreased strength;Decreased activity tolerance;Impaired balance (sitting and/or standing);Pain   OT Treatment/Interventions: Self-care/ADL training;Therapeutic exercise;Therapeutic activities;DME and/or AE instruction;Patient/family education;Balance training;Energy conservation      OT Goals(Current goals can be found in the care plan section)   Acute Rehab OT Goals Patient Stated Goal: to use the bathroom OT Goal Formulation: With patient Time For Goal Achievement: 12/29/24 Potential to Achieve Goals: Good   OT Frequency:  Min 2X/week       AM-PAC OT 6 Clicks Daily Activity     Outcome Measure Help from another person eating meals?: None Help from another person taking care of personal grooming?: None Help from another person toileting, which includes using toliet, bedpan, or urinal?: A Little Help from another person bathing (including washing, rinsing, drying)?: A Lot Help from another person to put on and taking off regular upper body clothing?: A Little Help from another person to put on and taking off regular lower body clothing?: Total 6 Click Score: 17   End of Session Equipment Utilized During Treatment: Gait belt;Rolling walker (2 wheels);Oxygen Nurse Communication: Mobility status  Activity Tolerance: Patient tolerated treatment well Patient left: in bed;with call bell/phone within reach;with bed alarm set  OT Visit Diagnosis: Muscle weakness (generalized) (M62.81);Pain Pain - Right/Left: Right Pain - part of body: Hip  Time: 8484-8382 OT Time Calculation (min): 62 min Charges:  OT General Charges $OT Visit: 1 Visit OT Evaluation $OT Eval Moderate Complexity: 1 Mod OT Treatments $Self Care/Home Management : 38-52 mins  Leita Howell, OTR/L,CBIS  Supplemental  OT - [facility] and [facility] Secure Chat Preferred    Zaron Zwiefelhofer, Leita BIRCH 12/15/2024, 4:40 PM

## 2024-12-15 NOTE — Progress Notes (Signed)
 Subjective: 2 Days Post-Op Procedures (LRB): ARTHROPLASTY, HIP, TOTAL, ANTERIOR APPROACH (Right) Patient reports pain as moderate.    Objective: Vital signs in last 24 hours: Temp:  [97.5 F (36.4 C)-98 F (36.7 C)] 97.5 F (36.4 C) (01/26 1351) Pulse Rate:  [81-94] 91 (01/26 1351) Resp:  [15-16] 15 (01/26 1351) BP: (154-179)/(84-94) 157/84 (01/26 1351) SpO2:  [96 %-98 %] 98 % (01/26 1351)  Intake/Output from previous day: 01/25 0701 - 01/26 0700 In: 150 [P.O.:150] Out: 250 [Urine:250] Intake/Output this shift: No intake/output data recorded.  Recent Labs    12/12/24 2313 12/13/24 0445 12/13/24 1518 12/14/24 0340 12/15/24 0309  HGB 10.9* 11.1* 10.1* 9.3* 9.5*   Recent Labs    12/14/24 0340 12/15/24 0309  WBC 12.0* 12.6*  RBC 3.35* 3.49*  HCT 30.8* 31.5*  PLT 404* 452*   Recent Labs    12/14/24 0340 12/15/24 0309  NA 134* 135  K 3.9 3.6  CL 100 98  CO2 27 27  BUN 17 20  CREATININE 0.83 0.72  GLUCOSE 117* 102*  CALCIUM  8.1* 8.6*   No results for input(s): LABPT, INR in the last 72 hours.  Neurologically intact Neurovascular intact Sensation intact distally Intact pulses distally Dorsiflexion/Plantar flexion intact Incision: dressing C/D/I No cellulitis present Compartment soft   Assessment/Plan: 2 Days Post-Op Procedures (LRB): ARTHROPLASTY, HIP, TOTAL, ANTERIOR APPROACH (Right) Up with therapy WBAT RLE ABLA- mild and stable Dvt ppx- scds/lovenox  Post-op oxy rx on chart F/u with ortho two weeks post-op      Ronal LITTIE Grave 12/15/2024, 3:05 PM

## 2024-12-16 DIAGNOSIS — S72011A Unspecified intracapsular fracture of right femur, initial encounter for closed fracture: Secondary | ICD-10-CM

## 2024-12-16 DIAGNOSIS — S72001A Fracture of unspecified part of neck of right femur, initial encounter for closed fracture: Secondary | ICD-10-CM | POA: Diagnosis not present

## 2024-12-16 NOTE — Progress Notes (Signed)
 Physical Therapy Treatment Patient Details Name: Casey Schmidt MRN: [id number] DOB: 1956/01/10 Today's Date: 12/16/2024   History of Present Illness 69 yo female, presented to ED with persistent R hip pain after fall 2-3 weeks ago. Found to have R hip fracture and underwent R THA anterior approach on 12/13/2024 by Dr. Jerri. PMH: calcaneal fracture ORIF R 12/2015, L shoulder surgery, R wrist fx with sx 11/2013.    PT Comments  Pt educated on risks of immobility  and bed rest. Pt adamantly refuses any OOB activity but is agreeable to bed level exercises with strong encouragement. Plan for SNF remains appropriate at this time    If plan is discharge home, recommend the following: A lot of help with walking and/or transfers;A lot of help with bathing/dressing/bathroom;Help with stairs or ramp for entrance;Assist for transportation;Assistance with cooking/housework   Can travel by private vehicle     Yes  Equipment Recommendations  None recommended by PT    Recommendations for Other Services       Precautions / Restrictions Precautions Precautions: None Recall of Precautions/Restrictions: Intact Restrictions RLE Weight Bearing Per Provider Order: Weight bearing as tolerated     Mobility  Bed Mobility               General bed mobility comments: pt able to scoot up in bed with min assist. using bed features and trendelenberg position. verbal cues to self assist    Transfers                   General transfer comment: pt adamantly refused any OOB activity citing fatigue and sleeplessness as the reason    Ambulation/Gait                   Stairs             Wheelchair Mobility     Tilt Bed    Modified Rankin (Stroke Patients Only)       Balance                                            Communication Communication Communication: No apparent difficulties  Cognition Arousal: Alert Behavior During Therapy: Flat  affect   PT - Cognitive impairments: No apparent impairments                       PT - Cognition Comments: pt is alert, able to follow commands consistently. Ox3. closes eyes intermittently but verblaizes and responds throughout session Following commands: Intact      Cueing Cueing Techniques: Verbal cues, Tactile cues  Exercises Total Joint Exercises Ankle Circles/Pumps: PROM, AAROM, Both, 10 reps Quad Sets: AROM, Strengthening, Both, 10 reps, Limitations Quad Sets Limitations: constant cues for participation Heel Slides: AAROM, Right, 10 reps Hip ABduction/ADduction: AAROM, 10 reps, Right    General Comments        Pertinent Vitals/Pain Pain Assessment Pain Assessment: Faces Faces Pain Scale: Hurts little more Pain Location: R hip Pain Descriptors / Indicators: Discomfort, Grimacing Pain Intervention(s): Limited activity within patient's tolerance, Monitored during session, Premedicated before session, Repositioned    Home Living                          Prior Function            PT  Goals (current goals can now be found in the care plan section) Acute Rehab PT Goals Patient Stated Goal: I want to get back to my home and walk again PT Goal Formulation: With patient Time For Goal Achievement: 12/28/24 Potential to Achieve Goals: Good Progress towards PT goals: Progressing toward goals    Frequency    Min 3X/week      PT Plan      Co-evaluation              AM-PAC PT 6 Clicks Mobility   Outcome Measure  Help needed turning from your back to your side while in a flat bed without using bedrails?: A Little Help needed moving from lying on your back to sitting on the side of a flat bed without using bedrails?: A Little Help needed moving to and from a bed to a chair (including a wheelchair)?: A Lot Help needed standing up from a chair using your arms (e.g., wheelchair or bedside chair)?: A Lot Help needed to walk in hospital  room?: A Lot Help needed climbing 3-5 steps with a railing? : A Lot 6 Click Score: 14    End of Session   Activity Tolerance: Patient limited by fatigue;Other (comment) (self limiting) Patient left: in bed;with bed alarm set;with call bell/phone within reach   PT Visit Diagnosis: Other abnormalities of gait and mobility (R26.89);Muscle weakness (generalized) (M62.81)     Time: 8775-8761 PT Time Calculation (min) (ACUTE ONLY): 14 min  Charges:    $Gait Training: 8-22 mins PT General Charges $$ ACUTE PT VISIT: 1 Visit                     Tavita Eastham, PT  Acute Rehab Dept [facility]) [phone]  12/16/2024    [facility] 12/16/2024, 12:56 PM

## 2024-12-16 NOTE — NC FL2 (Signed)
 " [place]  MEDICAID FL2 LEVEL OF CARE FORM     IDENTIFICATION  Patient Name: TENYA ARAQUE Birthdate: 05/21/1956 Sex: female Admission Date (Current Location): 12/12/2024  [facility] and [place] Number:  Producer, Television/film/video and Address:  [facility],  [address], [place] [address]      Provider Number: [id number]  Attending Physician Name and Address:  Jearlean Harpin Agat*  Relative Name and Phone Number:  son, Veona Bittman @ [phone]    Current Level of Care: Hospital Recommended Level of Care: Skilled Nursing Facility Prior Approval Number:    Date Approved/Denied:   PASRR Number: [id number]  Discharge Plan: SNF    Current Diagnoses: Patient Active Problem List   Diagnosis Date Noted   Closed subcapital fracture of neck of femur, right, initial encounter (HCC) 12/16/2024   Closed right hip fracture, initial encounter (HCC) 12/13/2024   Anemia 12/13/2024   Essential hypertension 12/13/2024   ADHD 12/13/2024   Hypokalemia 12/13/2024   Hyponatremia 12/13/2024   Closed T1 fracture (HCC) 12/13/2024   Leukocytosis 12/13/2024   Humerus fracture 05/15/2022   PCP NOTES >>>>>>>>> 08/06/2015   Annual physical exam 07/09/2015   Rectal bleeding 06/29/2015   Underweight 10/30/2014   Snoring-- insomnia 07/22/2014   DJD -- wrist pain-- pain managment 06/24/2014   SMOKER 05/04/2010   COPD (chronic obstructive pulmonary disease) (HCC) 05/04/2010   Anxiety 02/22/2010   CARPAL TUNNEL SYNDROME, BILATERAL 02/22/2010   Allergic rhinitis 02/22/2010    Orientation RESPIRATION BLADDER Height & Weight     Self, Time, Situation, Place  O2 Continent, External catheter (currently with purewick; left elbow cellulitis (on doxycycline )) Weight: 97 lb (44 kg) Height:  5' 3 (160 cm)  BEHAVIORAL SYMPTOMS/MOOD NEUROLOGICAL BOWEL NUTRITION STATUS      Continent Diet (regular)  AMBULATORY STATUS COMMUNICATION OF NEEDS Skin   Extensive Assist Verbally Other  (Comment) (surgical incision)                       Personal Care Assistance Level of Assistance  Bathing, Dressing, Feeding Bathing Assistance: Limited assistance Feeding assistance: Limited assistance Dressing Assistance: Limited assistance     Functional Limitations Info  Sight, Hearing, Speech Sight Info: Adequate Hearing Info: Adequate Speech Info: Adequate    SPECIAL CARE FACTORS FREQUENCY  PT (By licensed PT), OT (By licensed OT)     PT Frequency: 5x/wk OT Frequency: 5x/wk            Contractures Contractures Info: Not present    Additional Factors Info  Code Status, Allergies Code Status Info: Full Allergies Info: Amoxicillin , Aspirin, Clarithromycin, Codeine, Erythromycin, Nsaids, Tolmetin, Tramadol           Current Medications (12/16/2024):  This is the current hospital active medication list Current Facility-Administered Medications  Medication Dose Route Frequency Provider Last Rate Last Admin   acetaminophen  (TYLENOL ) tablet 650 mg  650 mg Oral Q6H Mdala-Gausi, Masiku Agatha, MD       albuterol  (PROVENTIL ) (2.5 MG/3ML) 0.083% nebulizer solution 3 mL  3 mL Inhalation Q4H PRN Jerri Kay HERO, MD       alum & mag hydroxide-simeth (MAALOX/MYLANTA) 200-200-20 MG/5ML suspension 30 mL  30 mL Oral Q4H PRN Jerri Kay HERO, MD       docusate sodium  (COLACE) capsule 100 mg  100 mg Oral BID Jerri Kay HERO, MD   100 mg at 12/14/24 0846   doxycycline  (VIBRA -TABS) tablet 100 mg  100 mg Oral Q12H Xu, Naiping  M, MD   100 mg at 12/16/24 1054   enoxaparin  (LOVENOX ) injection 30 mg  30 mg Subcutaneous Daily Jerri Kay HERO, MD   30 mg at 12/15/24 0840   feeding supplement (ENSURE PLUS HIGH PROTEIN) liquid 237 mL  237 mL Oral BID BM Mdala-Gausi, Masiku Agatha, MD   237 mL at 12/16/24 1435   guaiFENesin -dextromethorphan  (ROBITUSSIN DM) 100-10 MG/5ML syrup 5 mL  5 mL Oral Q4H PRN Mdala-Gausi, Masiku Agatha, MD   5 mL at 12/16/24 1107   magnesium  citrate solution 1 Bottle  1  Bottle Oral Once PRN Jerri Kay HERO, MD       menthol  (CEPACOL) lozenge 3 mg  1 lozenge Oral PRN Jerri Kay HERO, MD       Or   phenol (CHLORASEPTIC) mouth spray 1 spray  1 spray Mouth/Throat PRN Jerri Kay HERO, MD       methocarbamol  (ROBAXIN ) tablet 500 mg  500 mg Oral Q8H PRN Jerri Kay HERO, MD   500 mg at 12/16/24 0330   Or   methocarbamol  (ROBAXIN ) injection 500 mg  500 mg Intravenous Q8H PRN Jerri Kay HERO, MD       metoprolol  succinate (TOPROL -XL) 24 hr tablet 100 mg  100 mg Oral Daily Jerri Kay HERO, MD   100 mg at 12/16/24 1054   morphine  (PF) 2 MG/ML injection 0.5-1 mg  0.5-1 mg Intravenous Q2H PRN Jerri Kay HERO, MD   1 mg at 12/16/24 9561   multivitamin with minerals tablet 1 tablet  1 tablet Oral Daily Mdala-Gausi, Masiku Agatha, MD   1 tablet at 12/16/24 1055   ondansetron  (ZOFRAN ) tablet 4 mg  4 mg Oral Q6H PRN Jerri Kay HERO, MD       Or   ondansetron  (ZOFRAN ) injection 4 mg  4 mg Intravenous Q6H PRN Jerri Kay HERO, MD       oxyCODONE  (Oxy IR/ROXICODONE ) immediate release tablet 5 mg  5 mg Oral Q4H PRN Mdala-Gausi, Masiku Agatha, MD   5 mg at 12/15/24 1059   Or   oxyCODONE  (Oxy IR/ROXICODONE ) immediate release tablet 7.5 mg  7.5 mg Oral Q4H PRN Mdala-Gausi, Masiku Agatha, MD   7.5 mg at 12/16/24 0751   polyethylene glycol (MIRALAX  / GLYCOLAX ) packet 17 g  17 g Oral Daily PRN Jerri Kay HERO, MD       sorbitol  70 % solution 30 mL  30 mL Oral Daily PRN Jerri Kay HERO, MD         Discharge Medications: Please see discharge summary for a list of discharge medications.  Relevant Imaging Results:  Relevant Lab Results:   Additional Information SS# [id number]  NORMAN ASPEN, LCSW     "

## 2024-12-16 NOTE — Progress Notes (Signed)
 Physical Therapy Treatment Patient Details Name: Casey Schmidt MRN: [id number] DOB: December 18, 1955 Today's Date: 12/16/2024   History of Present Illness 69 yo female, presented to ED with persistent R hip pain after fall 2-3 weeks ago. Found to have R hip fracture and underwent R THA anterior approach on 12/13/2024 by Dr. Jerri. PMH: calcaneal fracture ORIF R 12/2015, L shoulder surgery, R wrist fx with sx 11/2013.    PT Comments  Pt admitted with above diagnosis.  Pt currently with functional limitations due to the deficits listed below (see PT Problem List). PT coordinated with nursing staff to optimize pain management with pharmaceuticals, nursing informed therapist of concern per skin integrity since pt has declined OOB s/p R THA and attempts to have sit up in recliner.  PT returned later in the day to encourage and continue education on importance of OOB time, benefits and know risks of remaining in bed, pt acknowledged PT ed and continued to require extensive encouragement and time to progress to OOB activities. Bed mobility with increased time, use of hospital bed and cues with CGA. Pt agreeable to SPT bed <> BSC  with RW, min A and cues once returned to EOB pt made attempts to return to supine, PT strongly encouraged pt to sit in recliner for dinner, pt performed task with SPT bed to recliner with min A, RW and cues. Pt left seated in recliner, all needs in place, nurse aware pt declined CP and supplemental O2 to be donned. Pt is demonstrating slow progress due to apparent self limiting behaviors and will continue to require substantial encouragement for participation with therapy. Patient will benefit from continued inpatient follow up therapy, <3 hours/day.  Pt will benefit from acute skilled PT to increase their independence and safety with mobility to allow discharge.      If plan is discharge home, recommend the following: A lot of help with walking and/or transfers;A lot of help with  bathing/dressing/bathroom;Help with stairs or ramp for entrance;Assist for transportation;Assistance with cooking/housework   Can travel by private vehicle     Yes  Equipment Recommendations  None recommended by PT    Recommendations for Other Services       Precautions / Restrictions Precautions Precautions: None Recall of Precautions/Restrictions: Intact Restrictions Weight Bearing Restrictions Per Provider Order: No RLE Weight Bearing Per Provider Order: Weight bearing as tolerated     Mobility  Bed Mobility Overal bed mobility: Needs Assistance Bed Mobility: Supine to Sit     Supine to sit: HOB elevated, Used rails, Contact guard     General bed mobility comments: increased time, constant cues for encouragement    Transfers Overall transfer level: Needs assistance Equipment used: Rolling walker (2 wheels) Transfers: Bed to chair/wheelchair/BSC, Sit to/from Stand Sit to Stand: From elevated surface, Min assist   Step pivot transfers: Min assist       General transfer comment: extensive cues and encouragement for transfer tasks with cues for safety and RW management SPT bed <> BSC and pt then required coaching and ed on importance of OOB time, benefits and risks of remaining in bed pt then completed SPT bed to recliner with min/CGA and cues    Ambulation/Gait               General Gait Details: pt declined ambulation   Stairs             Wheelchair Mobility     Tilt Bed    Modified Rankin (Stroke Patients Only)  Balance Overall balance assessment: Needs assistance Sitting-balance support: Feet supported Sitting balance-Leahy Scale: Good     Standing balance support: Bilateral upper extremity supported, During functional activity, Reliant on assistive device for balance Standing balance-Leahy Scale: Poor                              Communication Communication Communication: No apparent difficulties  Cognition  Arousal: Alert Behavior During Therapy: Flat affect   PT - Cognitive impairments: No apparent impairments                         Following commands: Intact      Cueing Cueing Techniques: Verbal cues, Tactile cues, Gestural cues, Visual cues  Exercises      General Comments General comments (skin integrity, edema, etc.): PT doffed Chapman tube for transfer tasks, pt declined for PT to replace although pt exhibiting signs and symptoms of SOB--nurse made aware      Pertinent Vitals/Pain Pain Assessment Pain Assessment: Faces Faces Pain Scale: Hurts even more Pain Location: R hip Pain Descriptors / Indicators: Discomfort, Grimacing, Guarding, Operative site guarding Pain Intervention(s): Monitored during session, Limited activity within patient's tolerance, Premedicated before session, Repositioned (pt declined CP/ice)    Home Living                          Prior Function            PT Goals (current goals can now be found in the care plan section) Acute Rehab PT Goals Patient Stated Goal: I want to get back to my home and walk again PT Goal Formulation: With patient Time For Goal Achievement: 12/28/24 Potential to Achieve Goals: Good Progress towards PT goals: Not progressing toward goals - comment (pt exhibiting self limiting behaviors)    Frequency    Min 3X/week      PT Plan      Co-evaluation              AM-PAC PT 6 Clicks Mobility   Outcome Measure  Help needed turning from your back to your side while in a flat bed without using bedrails?: A Little Help needed moving from lying on your back to sitting on the side of a flat bed without using bedrails?: A Little Help needed moving to and from a bed to a chair (including a wheelchair)?: A Little Help needed standing up from a chair using your arms (e.g., wheelchair or bedside chair)?: A Little Help needed to walk in hospital room?: Total Help needed climbing 3-5 steps with a  railing? : Total 6 Click Score: 14    End of Session Equipment Utilized During Treatment: Gait belt Activity Tolerance: Patient limited by fatigue;Other (comment);Patient limited by pain (self limiting behaviors noted) Patient left: with call bell/phone within reach;in chair;with chair alarm set Nurse Communication: Mobility status PT Visit Diagnosis: Other abnormalities of gait and mobility (R26.89);Muscle weakness (generalized) (M62.81)     Time: 8275-8256 PT Time Calculation (min) (ACUTE ONLY): 19 min  Charges:    $Therapeutic Activity: 8-22 mins PT General Charges $$ ACUTE PT VISIT: 1 Visit                     [place], PT Acute Rehab    [place] VEAR Drone 12/16/2024, 7:07 PM

## 2024-12-16 NOTE — TOC Initial Note (Signed)
 Transition of Care [facility]) - Initial/Assessment Note    Patient Details  Name: Casey Schmidt MRN: [id number] Date of Birth: 1956/01/26  Transition of Care [facility]) CM/SW Contact:    NORMAN ASPEN, LCSW Phone Number: 12/16/2024, 3:19 PM  Clinical Narrative:                  Met with pt today to review anticipated dc plans.  Pt lying in bed and very flat affect but does answer questions appropriately.  Confirms that she lives alone and has a son living locally who is supportive.  She is aware that PT has recommended SNF rehab due to her functional limitations and she is agreeable.  We reviewed that process of SNF bed search and insurance authorization.  Will begin seach.  Expected Discharge Plan: Skilled Nursing Facility Barriers to Discharge: Continued Medical Work up   Patient Goals and CMS Choice Patient states their goals for this hospitalization and ongoing recovery are:: return home following SNF rehab   Choice offered to / list presented to : NA [place] ownership interest in [facility].provided to:: Parent NA    Expected Discharge Plan and Services In-house Referral: Clinical Social Work Discharge Planning Services: NA Post Acute Care Choice: NA Living arrangements for the past 2 months: Apartment                 DME Arranged: N/A DME Agency: NA       HH Arranged: NA HH Agency: NA        Prior Living Arrangements/Services Living arrangements for the past 2 months: Apartment Lives with:: Self Patient language and need for interpreter reviewed:: Yes Do you feel safe going back to the place where you live?: Yes      Need for Family Participation in Patient Care: Yes (Comment) Care giver support system in place?: No (comment)   Criminal Activity/Legal Involvement Pertinent to Current Situation/Hospitalization: No - Comment as needed  Activities of Daily Living   ADL Screening (condition at time of admission) Independently performs ADLs?: Yes  (appropriate for developmental age) Is the patient deaf or have difficulty hearing?: No Does the patient have difficulty seeing, even when wearing glasses/contacts?: No Does the patient have difficulty concentrating, remembering, or making decisions?: No  Permission Sought/Granted Permission sought to share information with : Family Supports Permission granted to share information with : Yes, Verbal Permission Granted  Share Information with NAME: son, Casey Schmidt @ [phone]           Emotional Assessment Appearance:: Appears older than stated age Attitude/Demeanor/Rapport: Guarded Affect (typically observed): Flat Orientation: : Oriented to Self, Oriented to Place, Oriented to  Time, Oriented to Situation Alcohol  / Substance Use: Not Applicable Psych Involvement: No (comment)  Admission diagnosis:  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] Hip fracture (HCC) [S72.009A] Fall, initial encounter [W19.XXXA] Closed fracture of right hip, initial encounter [facility]) [S72.001A] Patient Active Problem List   Diagnosis Date Noted   Closed subcapital fracture of neck of femur, right, initial encounter (HCC) 12/16/2024   Closed right hip fracture, initial encounter (HCC) 12/13/2024   Anemia 12/13/2024   Essential hypertension 12/13/2024   ADHD 12/13/2024   Hypokalemia 12/13/2024   Hyponatremia 12/13/2024   Closed T1 fracture (HCC) 12/13/2024   Leukocytosis 12/13/2024   Humerus fracture 05/15/2022   PCP NOTES >>>>>>>>> 08/06/2015   Annual physical exam 07/09/2015   Rectal bleeding 06/29/2015   Underweight 10/30/2014   Snoring-- insomnia 07/22/2014   DJD -- wrist pain-- pain  managment 06/24/2014   SMOKER 05/04/2010   COPD (chronic obstructive pulmonary disease) (HCC) 05/04/2010   Anxiety 02/22/2010   CARPAL TUNNEL SYNDROME, BILATERAL 02/22/2010   Allergic rhinitis 02/22/2010   PCP:  Douglass Gerard DEL, PA-C Pharmacy:   [facility] [phone] - [place], [place] - [address]  [place] [address] Phone: [phone] Fax: [phone]  [facility] [place], [place] - [address] [place] [address] Phone: [phone] Fax: [phone]  [id number] [place], [place] - [address] [place] [address] Phone: [phone] Fax: [phone]     Social Drivers of Health (SDOH) Social History: SDOH Screenings   Food Insecurity: No Food Insecurity (12/13/2024)  Housing: Low Risk (12/13/2024)  Transportation Needs: No Transportation Needs (12/13/2024)  Utilities: Not At Risk (12/13/2024)  Physical Activity: Sufficiently Active (06/16/2022)   [facility] from [facility] visits prior to 01/20/2023., [facility]  Social Connections: Unknown (12/13/2024)  Stress: No Stress Concern Present (06/16/2022)   Received from [facility], [facility] visits prior to 01/20/2023.  Tobacco Use: High Risk (12/13/2024)   SDOH Interventions:     Readmission Risk Interventions    12/16/2024    3:17 PM  Readmission Risk Prevention Plan  Transportation Screening Complete  PCP or Specialist Appt within 5-7 Days Complete  Home Care Screening Complete  Medication Review (RN CM) Complete

## 2024-12-16 NOTE — Progress Notes (Addendum)
 " Progress Note   Patient: Casey Schmidt FMW:[id number] DOB: [birth year] DOA: 12/12/2024     3 DOS: the patient was seen and examined on 12/16/2024    Brief hospital course: Casey Schmidt is a 69 y.o. female with COPD, hypertension, ADHD who presented to the ER with persistent right hip pain after a fall at home about 2 weeks ago.  The patient was found to have a right hip fracture.  Orthopedic surgery was consulted and patient underwent right total hip arthroplasty on 1/24.  Assessment and Plan:  Right hip fracture Due to mechanical fall. Orthopedic surgery was consulted. Patient underwent right total hip arthroplasty on 1/24. - Continue pain management: Scheduled Tylenol , as needed oxycodone  - DVT prophylaxis with SQ Lovenox  -plan to discharge on DVT prophylaxis for 28 days - Seen by PT/OT.  SNF recommended.  Left elbow cellulitis - Continue doxycycline .  Chronic T1 endplate fracture Noted on trauma imaging. - Continue supportive care.  COPD On albuterol  inhaler at home. Patient reportedly no longer taking Symbicort  or montelukast. -Continue PRN Albuterol   Hypoxia Currently requiring 3 L/min supplemental oxygen. No baseline home oxygen. -Continue supplemental oxygen, and wean off as tolerated.  Hypokalemia Presented with potassium of 3.1. - Will continue to replete as needed.  Hypertension -Continue metoprolol   ADHD On Adderall  at home. Adderall  not on hospital formulary. - Will hold until discharge.  Underweight Body mass index is 17.18 kg/m. - Nutritional supplementation with Ensure       Subjective: Patient complains of pain.    Physical Exam: BP (!) 157/89 (BP Location: Right Arm)   Pulse 81   Temp 97.6 F (36.4 C) (Oral)   Resp 16   Ht 5' 3 (1.6 m)   Wt 44 kg   SpO2 94%   BMI 17.18 kg/m    General: Alert, oriented X3  Eyes: Pupils equal, reactive  Oral cavity: moist mucous membranes  Head: Atraumatic, normocephalic  Neck:  supple  Chest: clear to auscultation. No crackles, no wheezes  CVS: S1,S2 RRR. No murmurs  Abd: No distention, soft, non-tender. No masses palpable  Extr: No edema   MSK: No joint deformities or swelling  Neurological: Grossly intact.    Data Reviewed:    Latest Ref Rng & Units 12/15/2024    3:09 AM 12/14/2024    3:40 AM 12/13/2024    3:18 PM  CBC  WBC 4.0 - 10.5 K/uL 12.6  12.0  20.7   Hemoglobin 12.0 - 15.0 g/dL 9.5  9.3  89.8   Hematocrit 36.0 - 46.0 % 31.5  30.8  31.2   Platelets 150 - 400 K/uL 452  404  504       Latest Ref Rng & Units 12/15/2024    3:09 AM 12/14/2024    3:40 AM 12/13/2024    3:18 PM  BMP  Glucose 70 - 99 mg/dL 897  882    BUN 8 - 23 mg/dL 20  17    Creatinine 9.55 - 1.00 mg/dL 9.27  9.16  9.23   Sodium 135 - 145 mmol/L 135  134    Potassium 3.5 - 5.1 mmol/L 3.6  3.9    Chloride 98 - 111 mmol/L 98  100    CO2 22 - 32 mmol/L 27  27    Calcium  8.9 - 10.3 mg/dL 8.6  8.1       Family Communication: n/a  Disposition: Status is: Inpatient  DVT PPx: SQ lovenox       Author: MDALA-GAUSI,  GOLDEN PILLOW, MD 12/16/2024 3:05 PM  For on call review [email or web address]    "

## 2024-12-17 ENCOUNTER — Encounter (HOSPITAL_COMMUNITY): Payer: Self-pay | Admitting: Orthopaedic Surgery

## 2024-12-17 LAB — BASIC METABOLIC PANEL WITH GFR
Anion gap: 11 (ref 5–15)
Anion gap: 9 (ref 5–15)
BUN: 15 mg/dL (ref 8–23)
BUN: 15 mg/dL (ref 8–23)
CO2: 30 mmol/L (ref 22–32)
CO2: 31 mmol/L (ref 22–32)
Calcium: 7.9 mg/dL — ABNORMAL LOW (ref 8.9–10.3)
Calcium: 8.6 mg/dL — ABNORMAL LOW (ref 8.9–10.3)
Chloride: 89 mmol/L — ABNORMAL LOW (ref 98–111)
Chloride: 92 mmol/L — ABNORMAL LOW (ref 98–111)
Creatinine, Ser: 0.5 mg/dL (ref 0.44–1.00)
Creatinine, Ser: 0.52 mg/dL (ref 0.44–1.00)
GFR, Estimated: >60 mL/min
GFR, Estimated: >60 mL/min
Glucose, Bld: 104 mg/dL — ABNORMAL HIGH (ref 70–99)
Glucose, Bld: 96 mg/dL (ref 70–99)
Potassium: 2.6 mmol/L — CL (ref 3.5–5.1)
Potassium: 4.6 mmol/L (ref 3.5–5.1)
Sodium: 130 mmol/L — ABNORMAL LOW (ref 135–145)
Sodium: 132 mmol/L — ABNORMAL LOW (ref 135–145)

## 2024-12-17 LAB — CBC
HCT: 28.6 % — ABNORMAL LOW (ref 36.0–46.0)
Hemoglobin: 9.8 g/dL — ABNORMAL LOW (ref 12.0–15.0)
MCH: 28.2 pg (ref 26.0–34.0)
MCHC: 34.3 g/dL (ref 30.0–36.0)
MCV: 82.2 fL (ref 80.0–100.0)
Platelets: 444 10*3/uL — ABNORMAL HIGH (ref 150–400)
RBC: 3.48 MIL/uL — ABNORMAL LOW (ref 3.87–5.11)
RDW: 15.9 % — ABNORMAL HIGH (ref 11.5–15.5)
WBC: 15.8 10*3/uL — ABNORMAL HIGH (ref 4.0–10.5)
nRBC: 0 % (ref 0.0–0.2)

## 2024-12-17 LAB — MAGNESIUM: Magnesium: 1.7 mg/dL (ref 1.7–2.4)

## 2024-12-17 MED ORDER — POTASSIUM CHLORIDE CRYS ER 20 MEQ PO TBCR
40.0000 meq | EXTENDED_RELEASE_TABLET | Freq: Once | ORAL | Status: AC
  Administered 2024-12-17: 40 meq via ORAL
  Filled 2024-12-17: qty 2

## 2024-12-17 MED ORDER — POTASSIUM CHLORIDE CRYS ER 20 MEQ PO TBCR
40.0000 meq | EXTENDED_RELEASE_TABLET | Freq: Every day | ORAL | Status: DC
  Administered 2024-12-18 – 2024-12-20 (×3): 40 meq via ORAL
  Filled 2024-12-17 (×3): qty 2

## 2024-12-17 MED ORDER — MAGNESIUM OXIDE -MG SUPPLEMENT 400 (240 MG) MG PO TABS
400.0000 mg | ORAL_TABLET | Freq: Two times a day (BID) | ORAL | Status: AC
  Administered 2024-12-17 – 2024-12-20 (×6): 400 mg via ORAL
  Filled 2024-12-17 (×8): qty 1

## 2024-12-17 MED ORDER — POTASSIUM CHLORIDE CRYS ER 20 MEQ PO TBCR
40.0000 meq | EXTENDED_RELEASE_TABLET | Freq: Once | ORAL | Status: DC
  Filled 2024-12-17: qty 2

## 2024-12-17 MED ORDER — POTASSIUM CHLORIDE CRYS ER 20 MEQ PO TBCR
40.0000 meq | EXTENDED_RELEASE_TABLET | ORAL | Status: DC
  Administered 2024-12-17: 40 meq via ORAL
  Filled 2024-12-17: qty 2

## 2024-12-17 MED ORDER — MELATONIN 3 MG PO TABS
3.0000 mg | ORAL_TABLET | Freq: Every evening | ORAL | Status: DC | PRN
  Administered 2024-12-17 – 2024-12-21 (×6): 3 mg via ORAL
  Filled 2024-12-17 (×6): qty 1

## 2024-12-17 NOTE — Plan of Care (Signed)
" °  Problem: Education: Goal: Knowledge of General Education information will improve Description: Including pain rating scale, medication(s)/side effects and non-pharmacologic comfort measures Outcome: Progressing   Problem: Health Behavior/Discharge Planning: Goal: Ability to manage health-related needs will improve Outcome: Progressing   Problem: Clinical Measurements: Goal: Ability to maintain clinical measurements within normal limits will improve Outcome: Progressing Goal: Will remain free from infection Outcome: Progressing Goal: Diagnostic test results will improve Outcome: Progressing Goal: Respiratory complications will improve Outcome: Progressing Goal: Cardiovascular complication will be avoided Outcome: Progressing   Problem: Activity: Goal: Risk for activity intolerance will decrease Outcome: Progressing   Problem: Nutrition: Goal: Adequate nutrition will be maintained Outcome: Progressing   Problem: Coping: Goal: Level of anxiety will decrease Outcome: Progressing   Problem: Elimination: Goal: Will not experience complications related to bowel motility Outcome: Progressing Goal: Will not experience complications related to urinary retention Outcome: Progressing   Problem: Pain Managment: Goal: General experience of comfort will improve and/or be controlled Outcome: Progressing   Problem: Safety: Goal: Ability to remain free from injury will improve Outcome: Progressing   Problem: Skin Integrity: Goal: Risk for impaired skin integrity will decrease Outcome: Progressing   Problem: Education: Goal: Verbalization of understanding the information provided (i.e., activity precautions, restrictions, etc) will improve Outcome: Progressing   Problem: Activity: Goal: Ability to ambulate and perform ADLs will improve Outcome: Progressing   Problem: Clinical Measurements: Goal: Postoperative complications will be avoided or minimized Outcome:  Progressing   Problem: Self-Concept: Goal: Ability to maintain and perform role responsibilities to the fullest extent possible will improve Outcome: Progressing   Problem: Pain Management: Goal: Pain level will decrease Outcome: Progressing   Problem: Education: Goal: Knowledge of the prescribed therapeutic regimen will improve Outcome: Progressing   Problem: Bowel/Gastric: Goal: Gastrointestinal status for postoperative course will improve Outcome: Progressing   Problem: Cardiac: Goal: Ability to maintain an adequate cardiac output Outcome: Progressing Goal: Will show no evidence of cardiac arrhythmias Outcome: Progressing   Problem: Nutritional: Goal: Will attain and maintain optimal nutritional status Outcome: Progressing   Problem: Neurological: Goal: Will regain or maintain usual level of consciousness Outcome: Progressing   Problem: Clinical Measurements: Goal: Ability to maintain clinical measurements within normal limits Outcome: Progressing Goal: Postoperative complications will be avoided or minimized Outcome: Progressing   Problem: Respiratory: Goal: Will regain and/or maintain adequate ventilation Outcome: Progressing Goal: Respiratory status will improve Outcome: Progressing   Problem: Skin Integrity: Goal: Demonstrates signs of wound healing without infection Outcome: Progressing   Problem: Urinary Elimination: Goal: Will remain free from infection Outcome: Progressing Goal: Ability to achieve and maintain adequate urine output Outcome: Progressing   "

## 2024-12-17 NOTE — Plan of Care (Signed)

## 2024-12-17 NOTE — Progress Notes (Addendum)
 " Progress Note   Patient: Casey Schmidt FMW:[id number] DOB: 06/24/1956 DOA: 12/12/2024     4 DOS: the patient was seen and examined on 12/17/2024    Brief hospital course: Casey Schmidt is a 69 y.o. female with COPD, hypertension, ADHD who presented to the ER with persistent right hip pain after a fall at home about 2 weeks ago.  The patient was found to have a right hip fracture.  Orthopedic surgery was consulted and patient underwent right total hip arthroplasty on 1/24.  Assessment and Plan:  Right hip fracture Due to mechanical fall. Orthopedic surgery was consulted. Patient underwent right total hip arthroplasty on 1/24. - Continue pain management: Scheduled Tylenol , as needed oxycodone  - DVT prophylaxis with SQ Lovenox  -plan to discharge on DVT prophylaxis for 28 days - Seen by PT/OT.  SNF recommended.  Left elbow cellulitis - Continue doxycycline , with end date 1/30.  Chronic T1 endplate fracture Noted on trauma imaging. - Continue supportive care.  COPD On albuterol  inhaler at home. Patient reportedly no longer taking Symbicort  or montelukast. -Continue PRN Albuterol   Hypoxia Currently requiring 3 L/min supplemental oxygen. No baseline home oxygen. -Continue supplemental oxygen, and wean off as tolerated.  Hypokalemia Presented with potassium of 3.1. - Will continue to replete as needed.  Hypertension -Continue metoprolol   ADHD On Adderall  at home. Adderall  not on hospital formulary. - Will hold until discharge.  Underweight Body mass index is 17.18 kg/m. - Nutritional supplementation with Ensure       Subjective: Patient complains of pain.  K+ of 2.6 noted today. Repletion ordered.   Physical Exam: BP 138/76 (BP Location: Right Arm)   Pulse 95   Temp 98.9 F (37.2 C) (Oral)   Resp 18   Ht 5' 3 (1.6 m)   Wt 44 kg   SpO2 98%   BMI 17.18 kg/m    General: Alert, oriented X3  Eyes: Pupils equal, reactive  Oral cavity: moist mucous  membranes  Head: Atraumatic, normocephalic  Neck: supple  Chest: Transmitted sounds CVS: S1,S2 RRR. No murmurs  Abd: No distention, soft, non-tender. No masses palpable  Extr: No edema   MSK: No joint deformities or swelling  Neurological: Grossly intact.    Data Reviewed:    Latest Ref Rng & Units 12/17/2024    3:26 AM 12/15/2024    3:09 AM 12/14/2024    3:40 AM  CBC  WBC 4.0 - 10.5 K/uL 15.8  12.6  12.0   Hemoglobin 12.0 - 15.0 g/dL 9.8  9.5  9.3   Hematocrit 36.0 - 46.0 % 28.6  31.5  30.8   Platelets 150 - 400 K/uL 444  452  404       Latest Ref Rng & Units 12/17/2024    3:26 AM 12/15/2024    3:09 AM 12/14/2024    3:40 AM  BMP  Glucose 70 - 99 mg/dL 895  897  882   BUN 8 - 23 mg/dL 15  20  17    Creatinine 0.44 - 1.00 mg/dL 9.49  9.27  9.16   Sodium 135 - 145 mmol/L 130  135  134   Potassium 3.5 - 5.1 mmol/L 2.6  3.6  3.9   Chloride 98 - 111 mmol/L 89  98  100   CO2 22 - 32 mmol/L 30  27  27    Calcium  8.9 - 10.3 mg/dL 7.9  8.6  8.1      Family Communication: n/a  Disposition: Status is: Inpatient  DVT PPx: SQ lovenox       Author: MDALA-GAUSI, Arek Spadafore AGATHA, MD 12/17/2024 11:25 AM  For on call review [email or web address]    "

## 2024-12-17 NOTE — Progress Notes (Addendum)
 Date and time results received: 12/17/24 06:00AM  Test: Potassium Critical Value: 2.6  Name of Provider Notified: Isaiah Lever, NP  Orders Received? Or Actions Taken?: Orders Received - See Orders for details

## 2024-12-17 NOTE — Progress Notes (Addendum)
 Physical Therapy Treatment Patient Details Name: Casey Schmidt MRN: [id number] DOB: Dec 08, 1955 Today's Date: 12/17/2024   History of Present Illness 69 yo female, presented to ED with persistent R hip pain after fall 2-3 weeks ago. Found to have R hip fracture and underwent R THA anterior approach on 12/13/2024 by Dr. Jerri. PMH: calcaneal fracture ORIF R 12/2015, L shoulder surgery, R wrist fx with sx 11/2013.    PT Comments  Pt requires maximum and constant cues/education on importance of mobility. Pt exhibits self limiting behaviors and attempts to decline any activity. Pt was premedicated prior to PT session. Amb short distance in room with min assist and 2nd person for safety. D/c plan remains appropriate.     If plan is discharge home, recommend the following: A lot of help with walking and/or transfers;A lot of help with bathing/dressing/bathroom;Help with stairs or ramp for entrance;Assist for transportation;Assistance with cooking/housework   Can travel by private vehicle     Yes  Equipment Recommendations  None recommended by PT    Recommendations for Other Services       Precautions / Restrictions Precautions Precautions: None Restrictions Weight Bearing Restrictions Per Provider Order: No RLE Weight Bearing Per Provider Order: Weight bearing as tolerated     Mobility  Bed Mobility Overal bed mobility: Needs Assistance Bed Mobility: Supine to Sit     Supine to sit: HOB elevated, Used rails, Contact guard     General bed mobility comments: increased time, constant cues for encouragement    Transfers Overall transfer level: Needs assistance Equipment used: Rolling walker (2 wheels) Transfers: Sit to/from Stand Sit to Stand: Contact guard assist, Min assist           General transfer comment: extensive cues and encouragement for transfer tasks with cues for safety and RW management. required coaching and ed on importance of OOB time, benefits and risks of  remaining in bed.    Ambulation/Gait Ambulation/Gait assistance: Min assist, +2 safety/equipment Gait Distance (Feet): 6 Feet Assistive device: Rolling walker (2 wheels) Gait Pattern/deviations: Decreased step length - right, Decreased step length - left, Narrow base of support, Trunk flexed       General Gait Details: heavy cues and encouragement to amb short distance in room. assist to steady and manage RW, 2nd person for safety and close chair follow pt complained of dizziness with BP 177/106 seated in recliner, RN updated. SpO2= 91% on RA, O2 replaced with SpO2= 96% on 2L (pt was at 3L at rest).   Stairs             Wheelchair Mobility     Tilt Bed    Modified Rankin (Stroke Patients Only)       Balance           Standing balance support: During functional activity, Reliant on assistive device for balance, Bilateral upper extremity supported Standing balance-Leahy Scale: Poor                              Communication Communication Communication: No apparent difficulties  Cognition Arousal: Alert Behavior During Therapy: Flat affect   PT - Cognitive impairments: No apparent impairments                       PT - Cognition Comments: pt repeatedly states she does not want to be OOB,  I can't, I broke my hip; pt educated on importance of mobility,  pt remains resistant Following commands: Intact      Cueing Cueing Techniques: Verbal cues, Tactile cues, Gestural cues, Visual cues  Exercises Total Joint Exercises Quad Sets Limitations: pt declined ex's    General Comments        Pertinent Vitals/Pain Pain Assessment Pain Assessment: Faces Faces Pain Scale: Hurts even more Pain Location: R hip Pain Descriptors / Indicators: Discomfort, Grimacing, Guarding, Operative site guarding Pain Intervention(s): Limited activity within patient's tolerance, Monitored during session, Premedicated before session, Repositioned    Home  Living                          Prior Function            PT Goals (current goals can now be found in the care plan section) Acute Rehab PT Goals PT Goal Formulation: With patient Time For Goal Achievement: 12/28/24 Potential to Achieve Goals: Fair Progress towards PT goals: Progressing toward goals (slowly)    Frequency    Min 3X/week      PT Plan      Co-evaluation              AM-PAC PT 6 Clicks Mobility   Outcome Measure  Help needed turning from your back to your side while in a flat bed without using bedrails?: A Little Help needed moving from lying on your back to sitting on the side of a flat bed without using bedrails?: A Little Help needed moving to and from a bed to a chair (including a wheelchair)?: A Little Help needed standing up from a chair using your arms (e.g., wheelchair or bedside chair)?: A Little Help needed to walk in hospital room?: A Lot Help needed climbing 3-5 steps with a railing? : Total 6 Click Score: 15    End of Session Equipment Utilized During Treatment: Gait belt Activity Tolerance: Other (comment) (self limiting behaviors) Patient left: with call bell/phone within reach;in chair;with chair alarm set Nurse Communication: Mobility status PT Visit Diagnosis: Other abnormalities of gait and mobility (R26.89);Muscle weakness (generalized) (M62.81)     Time: 8663-8650 PT Time Calculation (min) (ACUTE ONLY): 13 min  Charges:    $Gait Training: 8-22 mins PT General Charges $$ ACUTE PT VISIT: 1 Visit                     Brystal Kildow, PT  Acute Rehab Dept ([facility]) [phone]  12/17/2024    [facility] 12/17/2024, 3:03 PM

## 2024-12-18 LAB — BASIC METABOLIC PANEL WITH GFR
Anion gap: 9 (ref 5–15)
BUN: 13 mg/dL (ref 8–23)
CO2: 35 mmol/L — ABNORMAL HIGH (ref 22–32)
Calcium: 8.6 mg/dL — ABNORMAL LOW (ref 8.9–10.3)
Chloride: 91 mmol/L — ABNORMAL LOW (ref 98–111)
Creatinine, Ser: 0.6 mg/dL (ref 0.44–1.00)
GFR, Estimated: >60 mL/min
Glucose, Bld: 103 mg/dL — ABNORMAL HIGH (ref 70–99)
Potassium: 3.1 mmol/L — ABNORMAL LOW (ref 3.5–5.1)
Sodium: 135 mmol/L (ref 135–145)

## 2024-12-18 LAB — CBC
HCT: 32.7 % — ABNORMAL LOW (ref 36.0–46.0)
Hemoglobin: 10.7 g/dL — ABNORMAL LOW (ref 12.0–15.0)
MCH: 27.6 pg (ref 26.0–34.0)
MCHC: 32.7 g/dL (ref 30.0–36.0)
MCV: 84.5 fL (ref 80.0–100.0)
Platelets: 482 10*3/uL — ABNORMAL HIGH (ref 150–400)
RBC: 3.87 MIL/uL (ref 3.87–5.11)
RDW: 16.1 % — ABNORMAL HIGH (ref 11.5–15.5)
WBC: 13.8 10*3/uL — ABNORMAL HIGH (ref 4.0–10.5)
nRBC: 0 % (ref 0.0–0.2)

## 2024-12-18 LAB — MAGNESIUM: Magnesium: 2 mg/dL (ref 1.7–2.4)

## 2024-12-18 NOTE — Progress Notes (Signed)
 " Progress Note   Patient: Casey Schmidt FMW:[id number] DOB: 04/14/1956 DOA: 12/12/2024     5 DOS: the patient was seen and examined on 12/18/2024    Brief hospital course: Casey Schmidt is a 69 y.o. female with COPD, hypertension, ADHD who presented to the ER with persistent right hip pain after a fall at home about 2 weeks ago.  The patient was found to have a right hip fracture.  Orthopedic surgery was consulted and patient underwent right total hip arthroplasty on 1/24.  Assessment and Plan:  Right hip fracture Due to mechanical fall. Orthopedic surgery was consulted. Patient underwent right total hip arthroplasty on 1/24. - Continue pain management: Scheduled Tylenol , as needed oxycodone  - DVT prophylaxis with SQ Lovenox  -plan to discharge on DVT prophylaxis for 28 days (reported by RN that patient is refusing Lovenox  despite educating multiple times) - Seen by PT/OT.  SNF recommended.  Left elbow cellulitis - Continue doxycycline , with end date 1/30.  Chronic T1 endplate fracture Noted on trauma imaging. - Continue supportive care.  COPD On albuterol  inhaler at home. Patient reportedly no longer taking Symbicort  or montelukast. -Continue PRN Albuterol   Hypoxia Currently requiring 3 L/min supplemental oxygen. No baseline home oxygen. -Continue supplemental oxygen, and wean off as tolerated.  Hypokalemia Presented with potassium of 3.1. -Potassium is still low despite replacement, 3.1 on 1/29.  Will continue daily replacement   Hypertension -Continue metoprolol   ADHD On Adderall  at home. Adderall  not on hospital formulary. - Will hold until discharge.  Underweight Body mass index is 17.18 kg/m. - Nutritional supplementation with Ensure       Subjective: Patient seen and examined at the bedside.  Reports of pain in right hip, knee.  Vital signs are stable.  She is on supplemental oxygen 3 L/min.  She refused Lovenox  today   Physical Exam: BP (!)  184/96 (BP Location: Right Arm)   Pulse 87   Temp 97.6 F (36.4 C) (Oral)   Resp (!) 22   Ht 5' 3 (1.6 m)   Wt 44 kg   SpO2 94%   BMI 17.18 kg/m    General: Alert, oriented X3  Eyes: Pupils equal, reactive  Oral cavity: moist mucous membranes  Head: Atraumatic, normocephalic  Neck: supple  Chest: Transmitted sounds CVS: S1,S2 RRR. No murmurs  Abd: No distention, soft, non-tender. No masses palpable  Extr: No edema   MSK: No joint deformities or swelling  Neurological: Grossly intact.    Data Reviewed:    Latest Ref Rng & Units 12/18/2024    3:33 AM 12/17/2024    3:26 AM 12/15/2024    3:09 AM  CBC  WBC 4.0 - 10.5 K/uL 13.8  15.8  12.6   Hemoglobin 12.0 - 15.0 g/dL 89.2  9.8  9.5   Hematocrit 36.0 - 46.0 % 32.7  28.6  31.5   Platelets 150 - 400 K/uL 482  444  452       Latest Ref Rng & Units 12/18/2024    3:33 AM 12/17/2024    4:03 PM 12/17/2024    3:26 AM  BMP  Glucose 70 - 99 mg/dL 896  96  895   BUN 8 - 23 mg/dL 13  15  15    Creatinine 0.44 - 1.00 mg/dL 9.39  9.47  9.49   Sodium 135 - 145 mmol/L 135  132  130   Potassium 3.5 - 5.1 mmol/L 3.1  4.6  2.6   Chloride 98 - 111 mmol/L  91  92  89   CO2 22 - 32 mmol/L 35  31  30   Calcium  8.9 - 10.3 mg/dL 8.6  8.6  7.9      Family Communication: n/a  Disposition: Status is: Inpatient  DVT PPx: SQ lovenox       Author: Derryl Duval, MD 12/18/2024 11:19 AM  For on call review [email or web address]    "

## 2024-12-18 NOTE — Progress Notes (Signed)
 OT Cancellation Note  Patient Details Name: Casey Schmidt MRN: [id number] DOB: [birth year]   Cancelled Treatment:    Reason Eval/Treat Not Completed: Patient declined, no reason specified Pt on phone with bank about card issues and unable to participate in therapy this afternoon. Requested pain meds and nurse was made aware. Will follow up with pt at a later time as able.  Leita Howell, OTR/L,CBIS  Supplemental OT - [facility] Secure Chat Preferred   12/18/2024, 4:12 PM

## 2024-12-18 NOTE — Plan of Care (Signed)
" °  Problem: Education: Goal: Knowledge of General Education information will improve Description: Including pain rating scale, medication(s)/side effects and non-pharmacologic comfort measures Outcome: Progressing   Problem: Activity: Goal: Risk for activity intolerance will decrease Outcome: Progressing   Problem: Pain Managment: Goal: General experience of comfort will improve and/or be controlled Outcome: Progressing   Problem: Skin Integrity: Goal: Risk for impaired skin integrity will decrease Outcome: Progressing   Problem: Clinical Measurements: Goal: Ability to maintain clinical measurements within normal limits Outcome: Progressing   "

## 2024-12-19 LAB — BASIC METABOLIC PANEL WITH GFR
Anion gap: 9 (ref 5–15)
BUN: 14 mg/dL (ref 8–23)
CO2: 29 mmol/L (ref 22–32)
Calcium: 8.3 mg/dL — ABNORMAL LOW (ref 8.9–10.3)
Chloride: 92 mmol/L — ABNORMAL LOW (ref 98–111)
Creatinine, Ser: 0.49 mg/dL (ref 0.44–1.00)
GFR, Estimated: >60 mL/min
Glucose, Bld: 105 mg/dL — ABNORMAL HIGH (ref 70–99)
Potassium: 3.3 mmol/L — ABNORMAL LOW (ref 3.5–5.1)
Sodium: 130 mmol/L — ABNORMAL LOW (ref 135–145)

## 2024-12-19 LAB — HEMOGLOBIN AND HEMATOCRIT, BLOOD
HCT: 28.5 % — ABNORMAL LOW (ref 36.0–46.0)
Hemoglobin: 9.4 g/dL — ABNORMAL LOW (ref 12.0–15.0)

## 2024-12-19 MED ORDER — POTASSIUM CHLORIDE 20 MEQ PO PACK
20.0000 meq | PACK | Freq: Two times a day (BID) | ORAL | Status: AC
  Administered 2024-12-19: 20 meq via ORAL
  Filled 2024-12-19 (×4): qty 1

## 2024-12-19 MED ORDER — MORPHINE SULFATE (PF) 2 MG/ML IV SOLN
1.0000 mg | Freq: Once | INTRAVENOUS | Status: AC
  Administered 2024-12-19: 1 mg via INTRAVENOUS
  Filled 2024-12-19: qty 1

## 2024-12-19 MED ORDER — PANTOPRAZOLE SODIUM 40 MG IV SOLR
40.0000 mg | Freq: Two times a day (BID) | INTRAVENOUS | Status: DC
  Administered 2024-12-19 – 2024-12-21 (×4): 40 mg via INTRAVENOUS
  Filled 2024-12-19 (×6): qty 10

## 2024-12-19 MED ORDER — OXYCODONE HCL 5 MG PO TABS
10.0000 mg | ORAL_TABLET | ORAL | Status: DC | PRN
  Administered 2024-12-19 – 2024-12-21 (×11): 10 mg via ORAL
  Filled 2024-12-19 (×11): qty 2

## 2024-12-19 NOTE — Progress Notes (Signed)
 The patient states she does not want to go to SNF and would prefer to go home. I explained to her that in order to go home, she would need 24/7 supervision for a period of time. The patient responded that she would work on arranging for  Someone to stay with her.

## 2024-12-19 NOTE — Progress Notes (Signed)
 " Progress Note   Patient: Casey Schmidt FMW:[id number] DOB: February 06, 1956 DOA: 12/12/2024     6 DOS: the patient was seen and examined on 12/19/2024    Brief hospital course: Casey Schmidt is a 69 y.o. female with COPD, hypertension, ADHD who presented to the ER with persistent right hip pain after a fall at home about 2 weeks ago.  The patient was found to have a right hip fracture.  Orthopedic surgery was consulted and patient underwent right total hip arthroplasty on 1/24.  Assessment and Plan:  Right hip fracture Due to mechanical fall. Patient underwent right total hip arthroplasty on 1/24. - Continue pain management: Scheduled Tylenol , as needed oxycodone .  Dilaudid will be discontinued.  Continue Robaxin .  Encourage ambulation. - DVT prophylaxis with SQ Lovenox  -plan to discharge on DVT prophylaxis for 28 days  - Seen by PT/OT.  SNF recommended.  Left elbow cellulitis - Continue doxycycline , with end date 1/30.  Dark stool: Nursing reported liquid stool which is dark concerning for bleeding. Hemoglobin will be checked it is stable at 9.4. Will empirically start IV Protonix , check FOBT.  Serial hemoglobin checks.  Monitor stool color  Chronic T1 endplate fracture Noted on trauma imaging. - Continue supportive care.  COPD On albuterol  inhaler at home. Patient reportedly no longer taking Symbicort  or montelukast. -Continue PRN Albuterol   Hypoxia Currently requiring 3 L/min supplemental oxygen. No baseline home oxygen. -Continue supplemental oxygen, and wean off as tolerated.  Hypokalemia Presented with potassium of 3.1. -Potassium is still low despite replacement, 3.3 on 1/30.  Will schedule p.o. KCl  Hypertension -Continue metoprolol   ADHD On Adderall  at home. Adderall  not on hospital formulary. - Will hold until discharge.  Underweight Body mass index is 17.18 kg/m. - Nutritional supplementation with Ensure       Subjective: Patient seen and  examined at the bedside.  Says she does not feel good.  Has been receiving IV Dilaudid, oxycodone .  Nursing reported liquidy dark stool today.  Repeat hemoglobin check is stable.  Remains on 3 L/min of supplemental oxygen.  Physical Exam: BP (!) 151/73 (BP Location: Right Arm)   Pulse (!) 106   Temp 97.8 F (36.6 C)   Resp 18   Ht 5' 3 (1.6 m)   Wt 44 kg   SpO2 97%   BMI 17.18 kg/m    General: Alert, oriented X3  Eyes: Pupils equal, reactive  Oral cavity: moist mucous membranes  Head: Atraumatic, normocephalic  Neck: supple  Chest: Transmitted sounds CVS: S1,S2 RRR. No murmurs  Abd: No distention, soft, non-tender. No masses palpable  Extr: No edema   MSK: No joint deformities or swelling  Neurological: Grossly intact.    Data Reviewed:    Latest Ref Rng & Units 12/19/2024    2:03 PM 12/18/2024    3:33 AM 12/17/2024    3:26 AM  CBC  WBC 4.0 - 10.5 K/uL  13.8  15.8   Hemoglobin 12.0 - 15.0 g/dL 9.4  89.2  9.8   Hematocrit 36.0 - 46.0 % 28.5  32.7  28.6   Platelets 150 - 400 K/uL  482  444       Latest Ref Rng & Units 12/19/2024    3:22 AM 12/18/2024    3:33 AM 12/17/2024    4:03 PM  BMP  Glucose 70 - 99 mg/dL 894  896  96   BUN 8 - 23 mg/dL 14  13  15    Creatinine 0.44 - 1.00  mg/dL 9.50  9.39  9.47   Sodium 135 - 145 mmol/L 130  135  132   Potassium 3.5 - 5.1 mmol/L 3.3  3.1  4.6   Chloride 98 - 111 mmol/L 92  91  92   CO2 22 - 32 mmol/L 29  35  31   Calcium  8.9 - 10.3 mg/dL 8.3  8.6  8.6      Family Communication: n/a  Disposition: Status is: Inpatient  DVT PPx: SQ lovenox       Author: Derryl Duval, MD 12/19/2024 2:56 PM  For on call review [email or web address].    "

## 2024-12-19 NOTE — Plan of Care (Signed)
" °  Problem: Education: Goal: Knowledge of General Education information will improve Description: Including pain rating scale, medication(s)/side effects and non-pharmacologic comfort measures Outcome: Adequate for Discharge   Problem: Health Behavior/Discharge Planning: Goal: Ability to manage health-related needs will improve Outcome: Progressing   Problem: Clinical Measurements: Goal: Ability to maintain clinical measurements within normal limits will improve Outcome: Progressing Goal: Will remain free from infection Outcome: Progressing Goal: Diagnostic test results will improve Outcome: Progressing Goal: Respiratory complications will improve Outcome: Progressing Goal: Cardiovascular complication will be avoided Outcome: Adequate for Discharge   Problem: Activity: Goal: Risk for activity intolerance will decrease Outcome: Progressing   Problem: Nutrition: Goal: Adequate nutrition will be maintained Outcome: Adequate for Discharge   Problem: Coping: Goal: Level of anxiety will decrease Outcome: Progressing   Problem: Elimination: Goal: Will not experience complications related to bowel motility Outcome: Completed/Met Goal: Will not experience complications related to urinary retention Outcome: Completed/Met   Problem: Pain Managment: Goal: General experience of comfort will improve and/or be controlled Outcome: Progressing   Problem: Safety: Goal: Ability to remain free from injury will improve Outcome: Progressing   Problem: Skin Integrity: Goal: Risk for impaired skin integrity will decrease Outcome: Progressing   Problem: Education: Goal: Verbalization of understanding the information provided (i.e., activity precautions, restrictions, etc) will improve Outcome: Progressing   Problem: Activity: Goal: Ability to ambulate and perform ADLs will improve Outcome: Adequate for Discharge   Problem: Clinical Measurements: Goal: Postoperative complications  will be avoided or minimized Outcome: Progressing   Problem: Self-Concept: Goal: Ability to maintain and perform role responsibilities to the fullest extent possible will improve Outcome: Progressing   Problem: Pain Management: Goal: Pain level will decrease Outcome: Progressing   Problem: Education: Goal: Knowledge of the prescribed therapeutic regimen will improve Outcome: Progressing   Problem: Bowel/Gastric: Goal: Gastrointestinal status for postoperative course will improve Outcome: Progressing   Problem: Cardiac: Goal: Ability to maintain an adequate cardiac output Outcome: Completed/Met Goal: Will show no evidence of cardiac arrhythmias Outcome: Completed/Met   Problem: Nutritional: Goal: Will attain and maintain optimal nutritional status Outcome: Adequate for Discharge   Problem: Neurological: Goal: Will regain or maintain usual level of consciousness Outcome: Completed/Met   Problem: Clinical Measurements: Goal: Ability to maintain clinical measurements within normal limits Outcome: Progressing Goal: Postoperative complications will be avoided or minimized Outcome: Progressing   Problem: Respiratory: Goal: Will regain and/or maintain adequate ventilation Outcome: Completed/Met Goal: Respiratory status will improve Outcome: Adequate for Discharge   Problem: Skin Integrity: Goal: Demonstrates signs of wound healing without infection Outcome: Progressing   Problem: Urinary Elimination: Goal: Will remain free from infection Outcome: Progressing Goal: Ability to achieve and maintain adequate urine output Outcome: Completed/Met   "

## 2024-12-19 NOTE — Progress Notes (Signed)
" ° °  Subjective:  Patient reports pain as mild.  Wants to go home.  Today's  total administered Morphine  Milligram Equivalents: 17.25  Objective:   VITALS:   Vitals:   12/18/24 0421 12/18/24 1218 12/18/24 2154 12/19/24 0637  BP: (!) 184/96 (!) 162/93 (!) 158/84 (!) 154/85  Pulse: 87 98 91 99  Resp: (!) 22 16 16 16   Temp: 97.6 F (36.4 C) 98.2 F (36.8 C) 98.6 F (37 C) 97.8 F (36.6 C)  TempSrc: Oral   Oral  SpO2: 94% 99%  100%  Weight:      Height:        Dorsiflexion/Plantar flexion intact Incision: dressing C/D/I and no drainage No cellulitis present Compartment soft   Lab Results  Component Value Date   WBC 13.8 (H) 12/18/2024   HGB 10.7 (L) 12/18/2024   HCT 32.7 (L) 12/18/2024   MCV 84.5 12/18/2024   PLT 482 (H) 12/18/2024     Assessment/Plan:  6 Days Post-Op   - Expected postop acute blood loss anemia - Up with PT/OT - DVT ppx - SCDs, ambulation, lovenox  - WBAT operative extremity - doxy x 10 days - follow up 2 weeks in office  Ozell Cummins 12/19/2024, 9:59 AM  "

## 2024-12-19 NOTE — Progress Notes (Signed)
 Physical Therapy Treatment Patient Details Name: Casey Schmidt MRN: [id number] DOB: [birth year] Today's Date: 12/19/2024   History of Present Illness 69 yo female, presented to ED with persistent R hip pain after fall 2-3 weeks ago. Found to have R hip fracture and underwent R THA anterior approach on 12/13/2024 by Dr. Jerri. PMH: calcaneal fracture ORIF R 12/2015, L shoulder surgery, R wrist fx with sx 11/2013.    PT Comments  Pt needing max education on purpose of skilled PT interventions to progress mobility. Pt lying flat in bed with BLE near hip neutral and BLE exentded, comes to sitting at bedside with supv, using bedrails and elevated HOB, cued for initiation. Pt powers to stand with RW and min A to steady, able to step pivot to [facility] with min A. Pt requiring BUE support on RW in standing, therapist providing total A for pericare after bowel movement on BSC. Pt amb 20 ft with RW, minimal bil foot clearance, short bil steps, narrow BOS with trunk forward flexed, near scissoring gait at times, min A for general unsteadiness with cues for posture and attention to foot clearance/placement. Pt up in recliner with all needs in reach, requesting to return to supine in bed- notified RN of pt's request and on RA SpO2 70s and on 2L SpO2 93%. Patient will benefit from continued inpatient follow up therapy, <3 hours/day prior to home alone; pt reports trying to find a friend who can stay with her 24/7 at home but unable to confirm a plan in place during treatment session.   If plan is discharge home, recommend the following: Help with stairs or ramp for entrance;Assist for transportation;Assistance with cooking/housework;A little help with walking and/or transfers;A little help with bathing/dressing/bathroom   Can travel by private vehicle     Yes  Equipment Recommendations  None recommended by PT    Recommendations for Other Services       Precautions / Restrictions Precautions Precautions:  Fall Restrictions Weight Bearing Restrictions Per Provider Order: No RLE Weight Bearing Per Provider Order: Weight bearing as tolerated     Mobility  Bed Mobility Overal bed mobility: Needs Assistance Bed Mobility: Supine to Sit     Supine to sit: Supervision, HOB elevated, Used rails     General bed mobility comments: cues for initiation and mobility, supv wtih pt using bedrails and elevated HOB    Transfers Overall transfer level: Needs assistance Equipment used: Rolling walker (2 wheels) Transfers: Sit to/from Stand, Bed to chair/wheelchair/BSC Sit to Stand: Min assist   Step pivot transfers: Min assist       General transfer comment: min A to power to stand and step pivot to [facility], cues for hand placement and initiation    Ambulation/Gait Ambulation/Gait assistance: Min assist Gait Distance (Feet): 20 Feet Assistive device: Rolling walker (2 wheels) Gait Pattern/deviations: Decreased step length - right, Decreased step length - left, Decreased stride length, Trunk flexed, Narrow base of support Gait velocity: decreased     General Gait Details: short bil steps with minimal bil foot clearance, narrow BOS and trunk forward flexed, cues for posture and foot clearance with minimal improvement, pt self directed wtih distance   Stairs             Wheelchair Mobility     Tilt Bed    Modified Rankin (Stroke Patients Only)       Balance Overall balance assessment: Needs assistance   Sitting balance-Leahy Scale: Good     Standing balance support:  Reliant on assistive device for balance, During functional activity, Bilateral upper extremity supported Standing balance-Leahy Scale: Poor                              Communication Communication Communication: No apparent difficulties  Cognition Arousal: Alert Behavior During Therapy: Flat affect   PT - Cognitive impairments: No family/caregiver present to determine baseline                        PT - Cognition Comments: pt needing max education on purpose of mobility, self directed Following commands: Intact      Cueing    Exercises      General Comments General comments (skin integrity, edema, etc.): Pt on RA wtih SPO2 70s and HR 110-120 with poor waveform; pt on 2L with SpO2 93% and HR 110-120s with poor waveform; pt complaining of SOB throughout session and requesting to return to supine in bed to improve- notified RN      Pertinent Vitals/Pain Pain Assessment Pain Assessment: Faces Faces Pain Scale: Hurts little more Pain Location: R hip Pain Descriptors / Indicators: Grimacing, Guarding, Sore Pain Intervention(s): Limited activity within patient's tolerance, Monitored during session, Repositioned (declined tylenol  offerred by RN)    Home Living                          Prior Function            PT Goals (current goals can now be found in the care plan section) Acute Rehab PT Goals PT Goal Formulation: With patient Time For Goal Achievement: 12/28/24 Potential to Achieve Goals: Fair Progress towards PT goals: Progressing toward goals (slowly)    Frequency    Min 3X/week      PT Plan      Co-evaluation              AM-PAC PT 6 Clicks Mobility   Outcome Measure  Help needed turning from your back to your side while in a flat bed without using bedrails?: A Little Help needed moving from lying on your back to sitting on the side of a flat bed without using bedrails?: A Little Help needed moving to and from a bed to a chair (including a wheelchair)?: A Little Help needed standing up from a chair using your arms (e.g., wheelchair or bedside chair)?: A Little Help needed to walk in hospital room?: A Lot Help needed climbing 3-5 steps with a railing? : Total 6 Click Score: 15    End of Session Equipment Utilized During Treatment: Gait belt;Oxygen Activity Tolerance: Other (comment);Patient tolerated treatment well  (self limiting behaviors) Patient left: in chair;with call bell/phone within reach;with chair alarm set Nurse Communication: Mobility status;Other (comment) (SPO2) PT Visit Diagnosis: Other abnormalities of gait and mobility (R26.89);Muscle weakness (generalized) (M62.81)     Time: 8744-8674 PT Time Calculation (min) (ACUTE ONLY): 30 min  Charges:    $Gait Training: 8-22 mins $Therapeutic Activity: 8-22 mins PT General Charges $$ ACUTE PT VISIT: 1 Visit                     Tori Dan Dissinger PT, DPT 12/19/24, 2:19 PM

## 2024-12-20 ENCOUNTER — Inpatient Hospital Stay (HOSPITAL_COMMUNITY)

## 2024-12-20 DIAGNOSIS — S72001A Fracture of unspecified part of neck of right femur, initial encounter for closed fracture: Secondary | ICD-10-CM | POA: Diagnosis not present

## 2024-12-20 LAB — CBC WITH DIFFERENTIAL/PLATELET
Abs Immature Granulocytes: 0.04 10*3/uL (ref 0.00–0.07)
Basophils Absolute: 0 10*3/uL (ref 0.0–0.1)
Basophils Relative: 0 %
Eosinophils Absolute: 0.2 10*3/uL (ref 0.0–0.5)
Eosinophils Relative: 1 %
HCT: 27.7 % — ABNORMAL LOW (ref 36.0–46.0)
Hemoglobin: 9 g/dL — ABNORMAL LOW (ref 12.0–15.0)
Immature Granulocytes: 0 %
Lymphocytes Relative: 9 %
Lymphs Abs: 1 10*3/uL (ref 0.7–4.0)
MCH: 28.1 pg (ref 26.0–34.0)
MCHC: 32.5 g/dL (ref 30.0–36.0)
MCV: 86.6 fL (ref 80.0–100.0)
Monocytes Absolute: 0.8 10*3/uL (ref 0.1–1.0)
Monocytes Relative: 7 %
Neutro Abs: 9.2 10*3/uL — ABNORMAL HIGH (ref 1.7–7.7)
Neutrophils Relative %: 83 %
Platelets: 413 10*3/uL — ABNORMAL HIGH (ref 150–400)
RBC: 3.2 MIL/uL — ABNORMAL LOW (ref 3.87–5.11)
RDW: 16.3 % — ABNORMAL HIGH (ref 11.5–15.5)
WBC: 11.2 10*3/uL — ABNORMAL HIGH (ref 4.0–10.5)
nRBC: 0 % (ref 0.0–0.2)

## 2024-12-20 LAB — BASIC METABOLIC PANEL WITH GFR
Anion gap: 7 (ref 5–15)
BUN: 13 mg/dL (ref 8–23)
CO2: 29 mmol/L (ref 22–32)
Calcium: 8.7 mg/dL — ABNORMAL LOW (ref 8.9–10.3)
Chloride: 95 mmol/L — ABNORMAL LOW (ref 98–111)
Creatinine, Ser: 0.64 mg/dL (ref 0.44–1.00)
GFR, Estimated: >60 mL/min
Glucose, Bld: 103 mg/dL — ABNORMAL HIGH (ref 70–99)
Potassium: 4.2 mmol/L (ref 3.5–5.1)
Sodium: 132 mmol/L — ABNORMAL LOW (ref 135–145)

## 2024-12-20 NOTE — Progress Notes (Signed)
 " PROGRESS NOTE  Casey Schmidt FMW:[id number] DOB: 1956/05/23 DOA: 12/12/2024 PCP: Douglass Gerard DEL, PA-C   LOS: 7 days   Brief Narrative / Interim history: 69 year old female with history of COPD, HTN, ADHD who presented to the hospital with right hip pain after a fall couple weeks ago.  Was found to have a right hip fracture, orthopedic surgery was consulted and underwent a right THA on 01/24.  It appears that she has had persistent hypoxemia postoperatively requiring 3 L, and there was concern about some melena on 1/30 and a fecal occult is pending  Subjective / 24h Interval events: She is complaining of hip pain, denies any shortness of breath, no chest discomfort, no abdominal discomfort, no nausea or vomiting  Assesement and Plan: Principal problem Right hip fracture -due to mechanical fall, orthopedic surgery consulted and she was taken to the OR on 1/24 status post THA.  Continue pain management, DVT prophylaxis with Lovenox .  PT/OT recommending SNF, TOC consulted, however patient tells me today that she will go home and not SNF  Active problems Left elbow cellulitis-status post completed course of doxycycline   Dark stool-hemoglobin is overall stable, has been started on PPI.  She has no abdominal discomfort and no frank bleeding.  Fecal occult is pending  Chronic T1 endplate fracture-continue supportive care, pain control, physical therapy  COPD-no wheezing, she has slight hypoxemia requiring supplemental oxygen and will try to wean off.  She is not using oxygen at home  Hypokalemia-continue to monitor and replenish potassium as indicated  Essential hypertension-continue metoprolol   ADHD-hold Adderall    Scheduled Meds:  acetaminophen   650 mg Oral Q6H   docusate sodium   100 mg Oral BID   enoxaparin  (LOVENOX ) injection  30 mg Subcutaneous Daily   feeding supplement  237 mL Oral BID BM   magnesium  oxide  400 mg Oral BID   metoprolol  succinate  100 mg Oral Daily    multivitamin with minerals  1 tablet Oral Daily   pantoprazole  (PROTONIX ) IV  40 mg Intravenous Q12H   potassium chloride   20 mEq Oral BID   potassium chloride   40 mEq Oral Daily   potassium chloride   40 mEq Oral Once   Continuous Infusions: PRN Meds:.albuterol , alum & mag hydroxide-simeth, guaiFENesin -dextromethorphan , magnesium  citrate, melatonin, menthol  **OR** phenol, methocarbamol  **OR** methocarbamol  (ROBAXIN ) injection, ondansetron  **OR** ondansetron  (ZOFRAN ) IV, oxyCODONE , polyethylene glycol, sorbitol   Current Outpatient Medications  Medication Instructions   albuterol  (PROAIR  HFA) 108 (90 Base) MCG/ACT inhaler 2 puffs, Inhalation, Every 4 hours PRN   alendronate (FOSAMAX) 70 mg, Oral, Weekly   ALPRAZolam  (XANAX ) 0.5 MG tablet    ALPRAZolam  (XANAX ) 0.5 mg, Oral, 4 times daily PRN   amphetamine -dextroamphetamine  (ADDERALL  XR) 20 MG 24 hr capsule 20 mg, Oral, 2 times daily   Azelastine HCl 137 MCG/SPRAY SOLN 1-2 sprays   cetirizine (ZYRTEC) 5 mg, Daily   Cholecalciferol (VITAMIN D  PO) 1 tablet, Daily   cyclobenzaprine  (FLEXERIL ) 10 mg, Oral, 2 times daily PRN   enoxaparin  (LOVENOX ) 30 mg, Subcutaneous, Every 24 hours   Fluticasone -Salmeterol (ADVAIR) 250-50 MCG/DOSE AEPB 1 puff, Inhalation, 2 times daily   methadone  (DOLOPHINE ) 55 mg, Daily   metoprolol  succinate (TOPROL -XL) 100 mg, Oral, Daily   montelukast (SINGULAIR) 10 mg, Daily at bedtime   Multiple Vitamin (MULTIVITAMIN) tablet 1 tablet, Daily   omeprazole  (PRILOSEC) 40 mg, Oral, 2 times daily   OVER THE COUNTER MEDICATION Vitamin D  3. Once daily.   oxyCODONE -acetaminophen  (PERCOCET) 5-325 MG tablet 1-2 tablets, Oral, 2 times  daily PRN   ranitidine  (ZANTAC ) 300 mg, Oral, Daily at bedtime   SYMBICORT  160-4.5 MCG/ACT inhaler 2 puffs, 2 times daily    Diet Orders (From admission, onward)     Start     Ordered   12/13/24 1413  Diet regular Room service appropriate? Yes; Fluid consistency: Thin  Diet effective now        Question Answer Comment  Room service appropriate? Yes   Fluid consistency: Thin      12/13/24 1412            DVT prophylaxis: enoxaparin  (LOVENOX ) injection 30 mg Start: 12/14/24 0800 SCDs Start: 12/13/24 1413 Place TED hose Start: 12/13/24 1413   Lab Results  Component Value Date   PLT 413 (H) 12/20/2024     Code Status: Full Code  Family Communication: No family at bedside, called son over the phone, no answer  Status is: Inpatient Remains inpatient appropriate because: Monitor hemoglobin, concern for melena, ?  Placement  Level of care: Telemetry  Consultants:  Orthopedic surgery  Objective: Vitals:   12/19/24 0637 12/19/24 1346 12/19/24 2224 12/20/24 0551  BP: (!) 154/85 (!) 151/73 (!) 143/74 (!) 140/69  Pulse: 99 (!) 106 97 78  Resp: 16 18 16 16   Temp: 97.8 F (36.6 C) 97.8 F (36.6 C) 98.4 F (36.9 C) 98 F (36.7 C)  TempSrc: Oral  Oral Oral  SpO2: 100% 97% 99% 99%  Weight:      Height:        Intake/Output Summary (Last 24 hours) at 12/20/2024 0949 Last data filed at 12/20/2024 9072 Gross per 24 hour  Intake 120 ml  Output --  Net 120 ml   Wt Readings from Last 3 Encounters:  12/13/24 44 kg  05/15/23 48.1 kg  03/30/23 48.1 kg    Examination:  Constitutional: NAD Eyes: no scleral icterus ENMT: Mucous membranes are moist.  Neck: normal, supple Respiratory: clear to auscultation bilaterally, no wheezing, no crackles. Normal respiratory effort.  Cardiovascular: Regular rate and rhythm, no murmurs / rubs / gallops. No LE edema.  Abdomen: non distended, no tenderness. Bowel sounds positive.  Musculoskeletal: no clubbing / cyanosis.   Data Reviewed: I have independently reviewed following labs and imaging studies   CBC Recent Labs  Lab 12/14/24 0340 12/15/24 0309 12/17/24 0326 12/18/24 0333 12/19/24 1403 12/20/24 0325  WBC 12.0* 12.6* 15.8* 13.8*  --  11.2*  HGB 9.3* 9.5* 9.8* 10.7* 9.4* 9.0*  HCT 30.8* 31.5* 28.6* 32.7* 28.5*  27.7*  PLT 404* 452* 444* 482*  --  413*  MCV 91.9 90.3 82.2 84.5  --  86.6  MCH 27.8 27.2 28.2 27.6  --  28.1  MCHC 30.2 30.2 34.3 32.7  --  32.5  RDW 16.3* 16.4* 15.9* 16.1*  --  16.3*  LYMPHSABS  --   --   --   --   --  1.0  MONOABS  --   --   --   --   --  0.8  EOSABS  --   --   --   --   --  0.2  BASOSABS  --   --   --   --   --  0.0    Recent Labs  Lab 12/14/24 0340 12/15/24 0309 12/17/24 0326 12/17/24 1603 12/18/24 0333 12/19/24 0322 12/20/24 0325  NA 134*   < > 130* 132* 135 130* 132*  K 3.9   < > 2.6* 4.6 3.1* 3.3* 4.2  CL 100   < >  89* 92* 91* 92* 95*  CO2 27   < > 30 31 35* 29 29  GLUCOSE 117*   < > 104* 96 103* 105* 103*  BUN 17   < > 15 15 13 14 13   CREATININE 0.83   < > 0.50 0.52 0.60 0.49 0.64  CALCIUM  8.1*   < > 7.9* 8.6* 8.6* 8.3* 8.7*  MG 2.1  --  1.7  --  2.0  --   --    < > = values in this interval not displayed.    ------------------------------------------------------------------------------------------------------------------ No results for input(s): CHOL, HDL, LDLCALC, TRIG, CHOLHDL, LDLDIRECT in the last 72 hours.  No results found for: HGBA1C ------------------------------------------------------------------------------------------------------------------ No results for input(s): TSH, T4TOTAL, T3FREE, THYROIDAB in the last 72 hours.  Invalid input(s): FREET3  Cardiac Enzymes No results for input(s): CKMB, TROPONINI, MYOGLOBIN in the last 168 hours.  Invalid input(s): CK ------------------------------------------------------------------------------------------------------------------ No results found for: BNP  CBG: No results for input(s): GLUCAP in the last 168 hours.  No results found for this or any previous visit (from the past 240 hours).   Radiology Studies: No results found.   Nilda Fendt, MD, PhD [facility]  Between 7 am - 7 pm I am available, please contact me via Amion (for  emergencies) or Securechat (non urgent messages)  Between 7 pm - 7 am I am not available, please contact night coverage MD/APP via Amion  "

## 2024-12-20 NOTE — Plan of Care (Signed)
  Problem: Health Behavior/Discharge Planning: Goal: Ability to manage health-related needs will improve Outcome: Progressing   Problem: Clinical Measurements: Goal: Ability to maintain clinical measurements within normal limits will improve Outcome: Progressing   Problem: Clinical Measurements: Goal: Will remain free from infection Outcome: Progressing   Problem: Clinical Measurements: Goal: Diagnostic test results will improve Outcome: Progressing   

## 2024-12-20 NOTE — Progress Notes (Signed)
 Provider notified around approximately 1125 due to patient refusing the majority of her morning medications including her Lovenox .  Provider also made aware of patient's respiratory status.  Patient noted to have a non-productive cough and with SOB.  Oxygen sats 90% on 2L O2.  Respiratory therapy notified to assist with providing patient her prn breathing treatment.  Oxygen sats 92-93% on 3L O2 via  nasal cannula.  Chest x-ray was completed-awaiting results.  PRN dose of cough suppression medication also administered.

## 2024-12-20 NOTE — Plan of Care (Signed)
" °  Problem: Nutrition: Goal: Adequate nutrition will be maintained Outcome: Progressing   Problem: Pain Management: Goal: Pain level will decrease Outcome: Progressing   Problem: Education: Goal: Knowledge of the prescribed therapeutic regimen will improve Outcome: Progressing   "

## 2024-12-21 DIAGNOSIS — S72001A Fracture of unspecified part of neck of right femur, initial encounter for closed fracture: Secondary | ICD-10-CM | POA: Diagnosis not present

## 2024-12-21 LAB — COMPREHENSIVE METABOLIC PANEL WITH GFR
ALT: 13 U/L (ref 0–44)
AST: 30 U/L (ref 15–41)
Albumin: 2.8 g/dL — ABNORMAL LOW (ref 3.5–5.0)
Alkaline Phosphatase: 101 U/L (ref 38–126)
Anion gap: 7 (ref 5–15)
BUN: 16 mg/dL (ref 8–23)
CO2: 29 mmol/L (ref 22–32)
Calcium: 8.3 mg/dL — ABNORMAL LOW (ref 8.9–10.3)
Chloride: 97 mmol/L — ABNORMAL LOW (ref 98–111)
Creatinine, Ser: 0.58 mg/dL (ref 0.44–1.00)
GFR, Estimated: >60 mL/min
Glucose, Bld: 104 mg/dL — ABNORMAL HIGH (ref 70–99)
Potassium: 4.4 mmol/L (ref 3.5–5.1)
Sodium: 132 mmol/L — ABNORMAL LOW (ref 135–145)
Total Bilirubin: <0.2 mg/dL (ref 0.0–1.2)
Total Protein: 5.4 g/dL — ABNORMAL LOW (ref 6.5–8.1)

## 2024-12-21 LAB — CBC
HCT: 25.6 % — ABNORMAL LOW (ref 36.0–46.0)
Hemoglobin: 8.2 g/dL — ABNORMAL LOW (ref 12.0–15.0)
MCH: 27.6 pg (ref 26.0–34.0)
MCHC: 32 g/dL (ref 30.0–36.0)
MCV: 86.2 fL (ref 80.0–100.0)
Platelets: 341 10*3/uL (ref 150–400)
RBC: 2.97 MIL/uL — ABNORMAL LOW (ref 3.87–5.11)
RDW: 16.5 % — ABNORMAL HIGH (ref 11.5–15.5)
WBC: 8.2 10*3/uL (ref 4.0–10.5)
nRBC: 0 % (ref 0.0–0.2)

## 2024-12-21 LAB — MAGNESIUM: Magnesium: 1.8 mg/dL (ref 1.7–2.4)

## 2024-12-21 MED ORDER — METHADONE HCL 10 MG/ML PO CONC
90.0000 mg | Freq: Every day | ORAL | Status: DC
  Administered 2024-12-21 – 2024-12-22 (×2): 90 mg via ORAL
  Filled 2024-12-21 (×2): qty 10

## 2024-12-21 NOTE — Plan of Care (Signed)
" °  Problem: Clinical Measurements: Goal: Ability to maintain clinical measurements within normal limits will improve Outcome: Progressing   Problem: Nutrition: Goal: Adequate nutrition will be maintained Outcome: Progressing   Problem: Activity: Goal: Ability to ambulate and perform ADLs will improve Outcome: Progressing   Problem: Pain Management: Goal: Pain level will decrease Outcome: Progressing   Problem: Urinary Elimination: Goal: Will remain free from infection Outcome: Progressing   "

## 2024-12-22 DIAGNOSIS — S72001A Fracture of unspecified part of neck of right femur, initial encounter for closed fracture: Secondary | ICD-10-CM | POA: Diagnosis not present

## 2024-12-22 LAB — HEMOGLOBIN AND HEMATOCRIT, BLOOD
HCT: 28.8 % — ABNORMAL LOW (ref 36.0–46.0)
Hemoglobin: 8.6 g/dL — ABNORMAL LOW (ref 12.0–15.0)

## 2024-12-22 MED ORDER — PANTOPRAZOLE SODIUM 40 MG PO TBEC: 40.0000 mg | DELAYED_RELEASE_TABLET | Freq: Two times a day (BID) | ORAL | Status: AC

## 2024-12-22 MED ORDER — AMPHETAMINE-DEXTROAMPHET ER 20 MG PO CP24: 20.0000 mg | ORAL_CAPSULE | Freq: Every day | ORAL | Status: AC

## 2024-12-22 MED ORDER — OXYCODONE-ACETAMINOPHEN 10-325 MG PO TABS: 1.0000 | ORAL_TABLET | ORAL | 0 refills | Status: AC | PRN

## 2024-12-22 MED ORDER — PANTOPRAZOLE SODIUM 40 MG PO TBEC
40.0000 mg | DELAYED_RELEASE_TABLET | Freq: Two times a day (BID) | ORAL | Status: DC
  Administered 2024-12-22: 40 mg via ORAL
  Filled 2024-12-22: qty 1

## 2024-12-22 NOTE — Progress Notes (Signed)
 Attempted to call report to [facility] at [phone] - no answer, called again at [phone] - still no answer.

## 2024-12-22 NOTE — TOC Transition Note (Signed)
 Transition of Care [facility]) - Discharge Note   Patient Details  Name: Casey Schmidt MRN: [id number] Date of Birth: [birth year]  [facility] CM/SW Contact:  Alfonse JONELLE Rex, RN Phone Number: 12/22/2024, 11:05 AM   Clinical Narrative:   DC to SNF-[facility]. Pts son, lynwood, notified of transfer. PTAR for transport. No further INPT CM needs at this time.     Final next level of care: Skilled Nursing Facility Barriers to Discharge: Barriers Resolved   Patient Goals and CMS Choice Patient states their goals for this hospitalization and ongoing recovery are:: return home following SNF rehab CMS Medicare.gov Compare Post Acute Care list provided to:: Patient Represenative (must comment) [facility] Warren Blades, Emergency Contact  [phone] [facility] Phone)) Choice offered to / list presented to : Adult Children [place] ownership interest in [facility].provided to:: Adult Children    Discharge Placement              Patient chooses bed at: [facility] Patient to be transferred to facility by: PTAR Name of family member notified: Showers Lynwood Warren Blades, Emergency Contact  [phone] [facility] Phone) Patient and family notified of of transfer: 12/22/24  Discharge Plan and Services Additional resources added to the After Visit Summary for   In-house Referral: Clinical Social Work Discharge Planning Services: NA Post Acute Care Choice: NA          DME Arranged: N/A DME Agency: NA       HH Arranged: NA HH Agency: NA        Social Drivers of Health (SDOH) Interventions SDOH Screenings   Food Insecurity: No Food Insecurity (12/13/2024)  Housing: Low Risk (12/13/2024)  Transportation Needs: No Transportation Needs (12/13/2024)  Utilities: Not At Risk (12/13/2024)  Physical Activity: Sufficiently Active (06/16/2022)   Received from [facility] prior to [facility]/20/2023., [facility]  Social Connections: Unknown  (12/13/2024)  Stress: No Stress Concern Present (06/16/2022)   Received from [facility], [facility] visits prior to 01/20/2023.  Tobacco Use: High Risk (12/13/2024)     Readmission Risk Interventions    12/16/2024    3:17 PM  Readmission Risk Prevention Plan  Transportation Screening Complete  PCP or Specialist Appt within 5-7 Days Complete  Home Care Screening Complete  Medication Review (RN CM) Complete

## 2024-12-30 ENCOUNTER — Ambulatory Visit: Admitting: Orthopaedic Surgery

## 2024-12-31 ENCOUNTER — Ambulatory Visit: Admitting: Physician Assistant
